# Patient Record
Sex: Male | Born: 1960 | Race: White | Hispanic: No | Marital: Married | Smoking: Never smoker
Health system: Southern US, Community
[De-identification: ages and names within clinical notes are randomized; demographics above are authoritative.]

## PROBLEM LIST (undated history)

## (undated) DIAGNOSIS — T7840XA Allergy, unspecified, initial encounter: Secondary | ICD-10-CM

## (undated) DIAGNOSIS — Z87442 Personal history of urinary calculi: Secondary | ICD-10-CM

## (undated) DIAGNOSIS — K5190 Ulcerative colitis, unspecified, without complications: Secondary | ICD-10-CM

## (undated) DIAGNOSIS — E785 Hyperlipidemia, unspecified: Secondary | ICD-10-CM

## (undated) DIAGNOSIS — N200 Calculus of kidney: Secondary | ICD-10-CM

## (undated) DIAGNOSIS — F419 Anxiety disorder, unspecified: Secondary | ICD-10-CM

## (undated) DIAGNOSIS — N189 Chronic kidney disease, unspecified: Secondary | ICD-10-CM

## (undated) DIAGNOSIS — K219 Gastro-esophageal reflux disease without esophagitis: Secondary | ICD-10-CM

## (undated) HISTORY — DX: Allergy, unspecified, initial encounter: T78.40XA

## (undated) HISTORY — PX: WISDOM TOOTH EXTRACTION: SHX21

## (undated) HISTORY — DX: Gastro-esophageal reflux disease without esophagitis: K21.9

## (undated) HISTORY — PX: APPENDECTOMY: SHX54

## (undated) HISTORY — PX: BACK SURGERY: SHX140

## (undated) HISTORY — DX: Hyperlipidemia, unspecified: E78.5

## (undated) HISTORY — PX: KNEE ARTHROSCOPY: SHX127

## (undated) HISTORY — PX: HERNIA REPAIR: SHX51

## (undated) HISTORY — DX: Ulcerative colitis, unspecified, without complications: K51.90

## (undated) HISTORY — DX: Anxiety disorder, unspecified: F41.9

---

## 2004-07-26 ENCOUNTER — Emergency Department (HOSPITAL_COMMUNITY): Admission: EM | Admit: 2004-07-26 | Discharge: 2004-07-26 | Payer: Self-pay | Admitting: Emergency Medicine

## 2004-07-26 IMAGING — CR DG CHEST 2V
3 series · 3 of 3 positions shown · non-contrast
Comparison: none

HISTORY: Chest pain, left arm pain

CHEST 2 VIEWS:
No prior exam for comparison
Normal heart size and vascularity.
Mediastinal contours normal.
Mild hyperinflation without acute infiltrate or effusion.
No pneumothorax.
Bones unremarkable.

[view not recorded (1 of 3)]
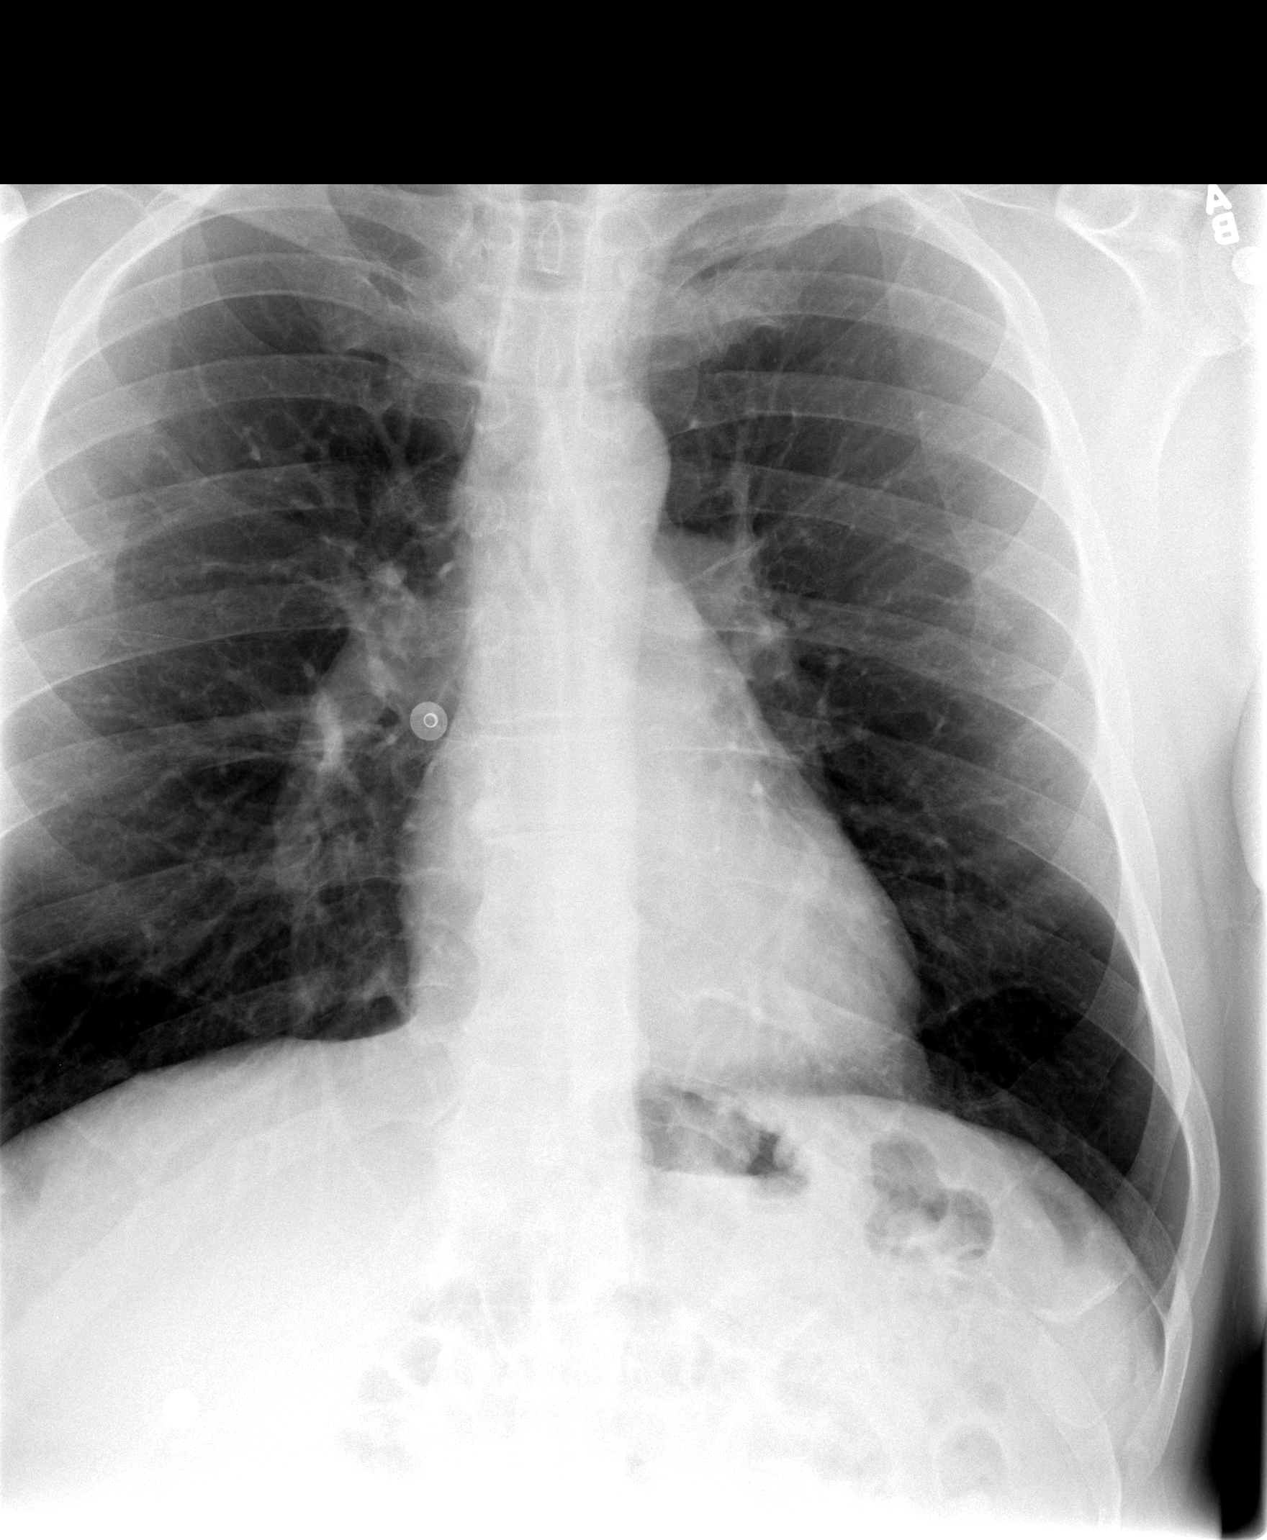

[view not recorded (2 of 3)]
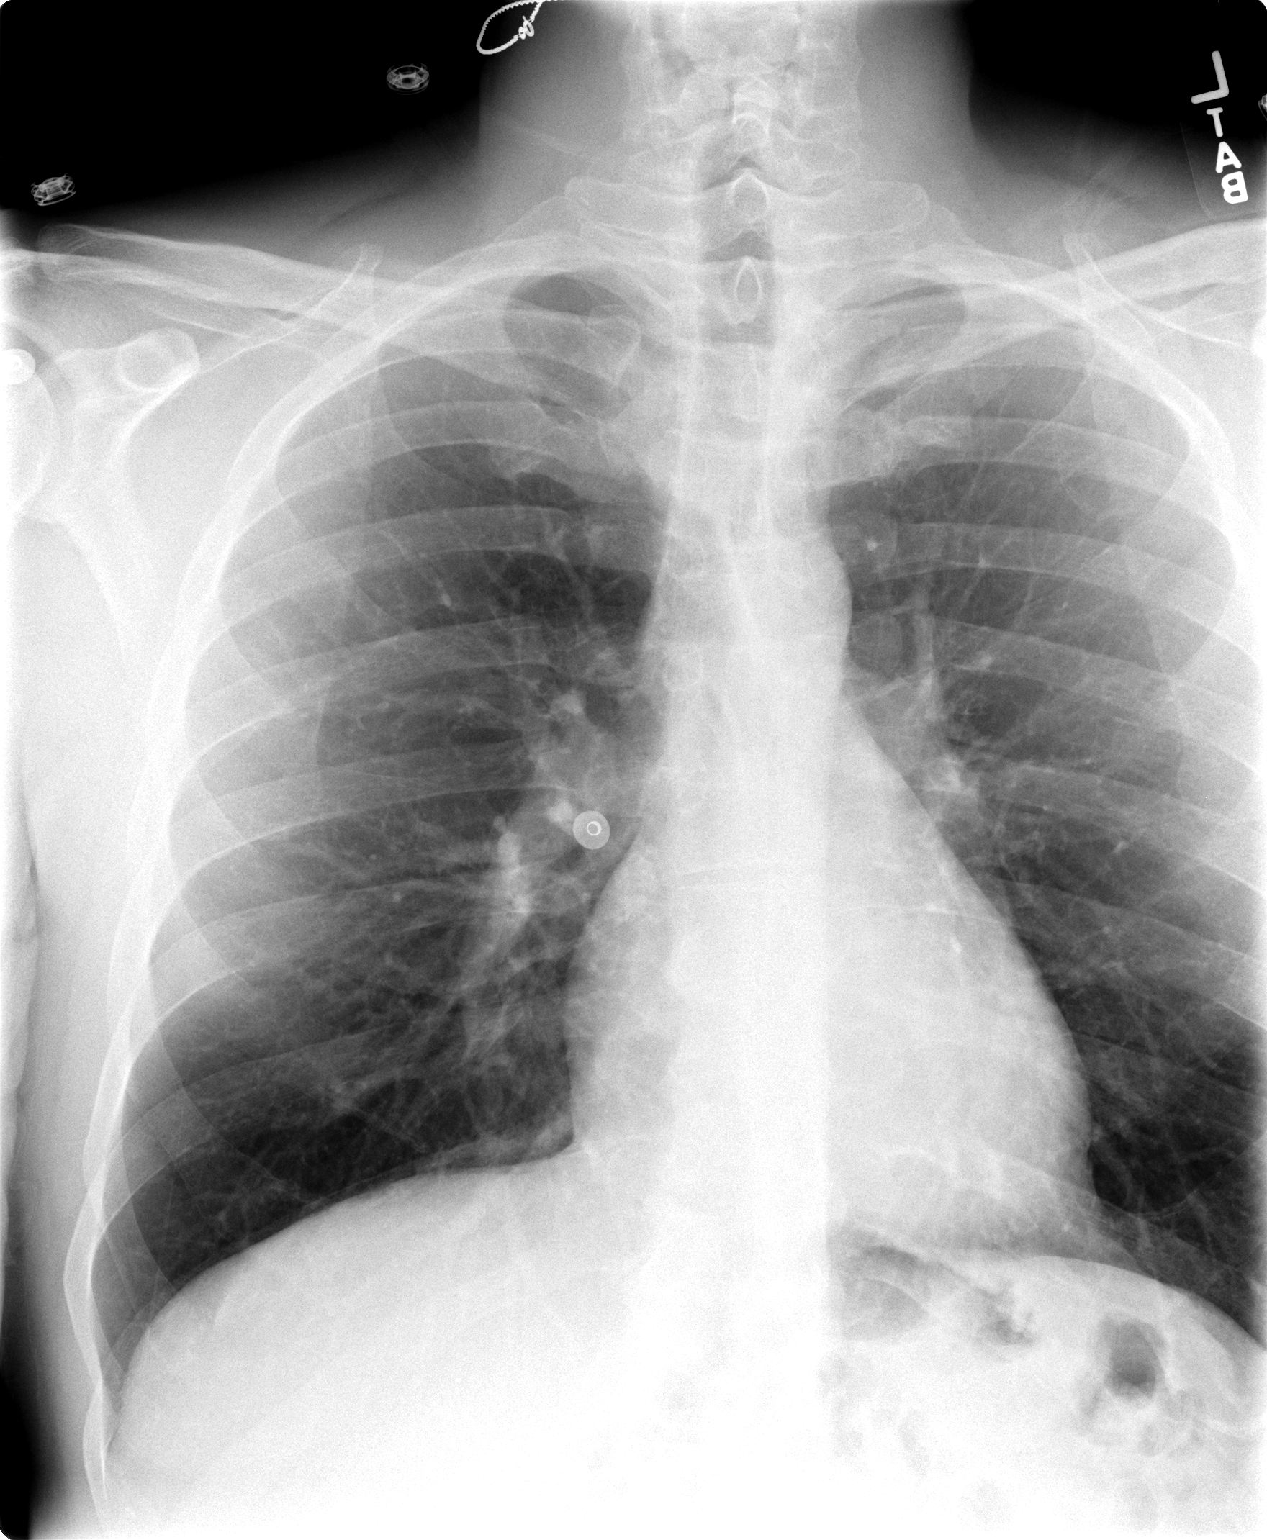

[view not recorded (3 of 3)]
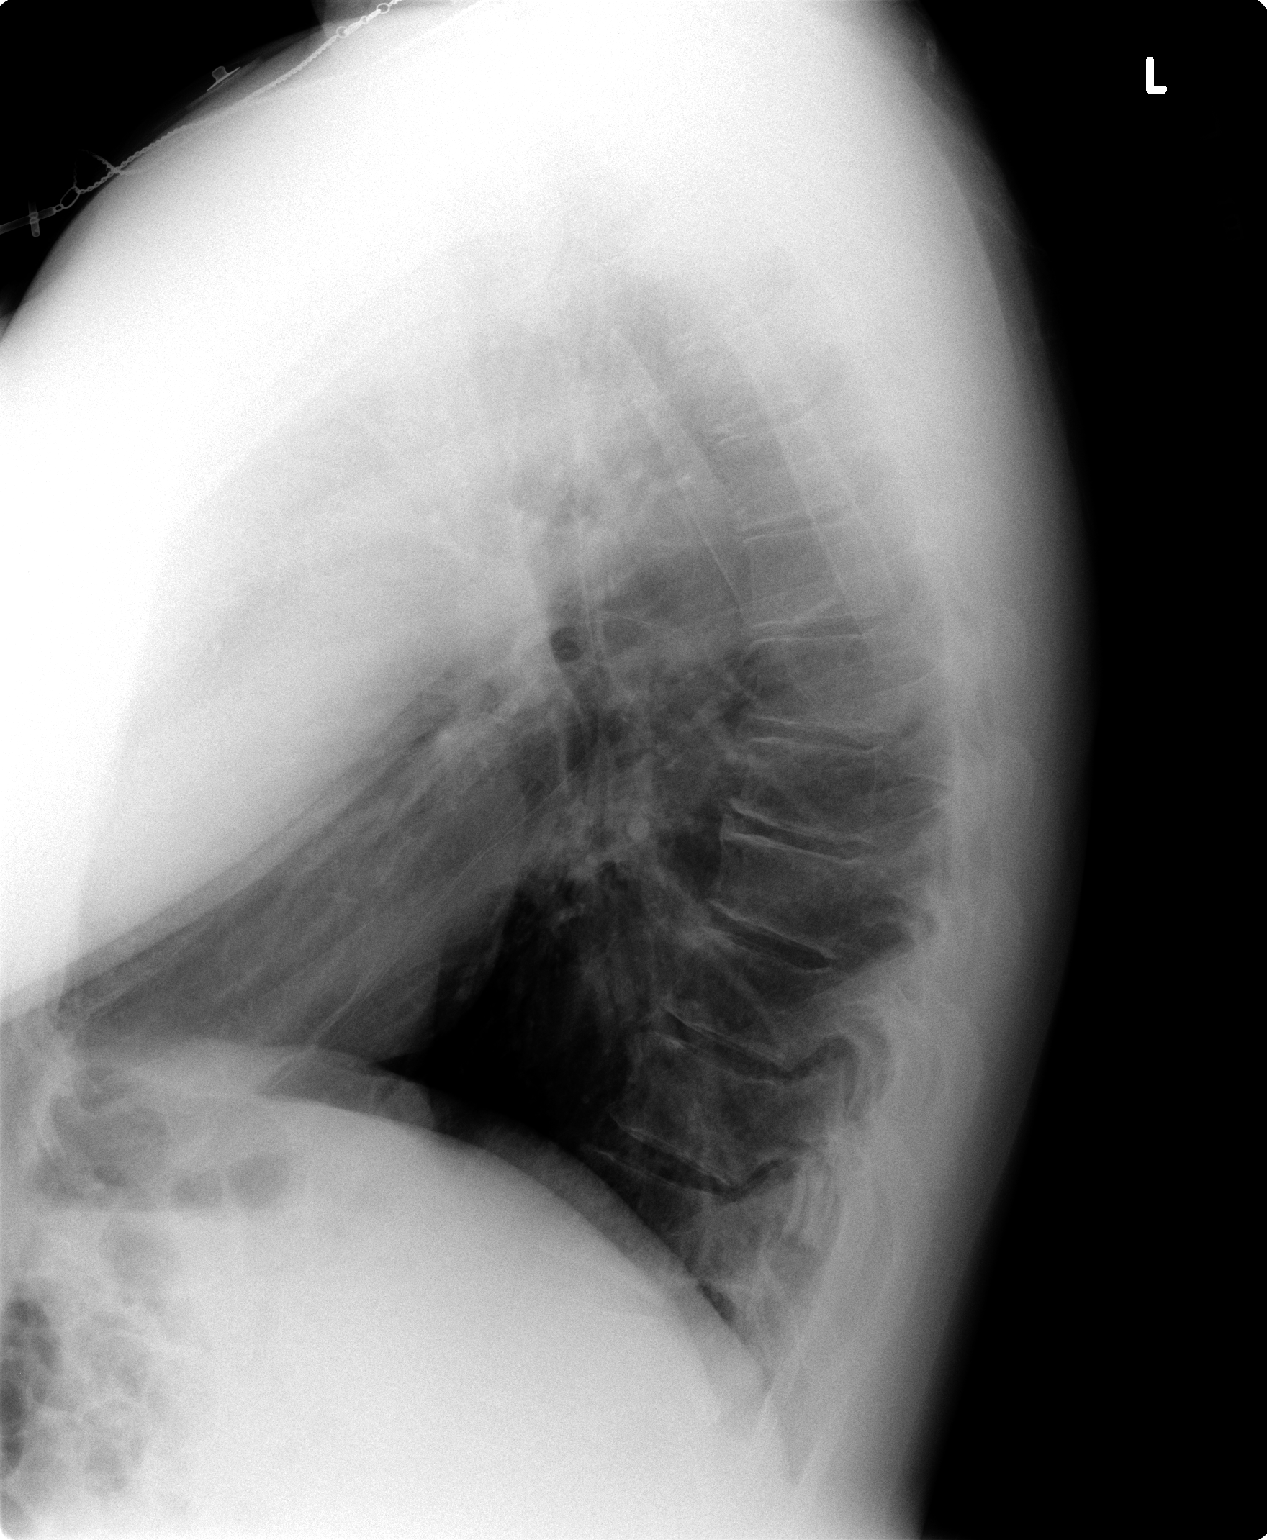

[3 of 3 positions shown; findings below may reference images not displayed]

IMPRESSION: Mild hyperinflation without acute abnormalities.

## 2004-09-24 ENCOUNTER — Encounter: Admission: RE | Admit: 2004-09-24 | Discharge: 2004-09-24 | Payer: Self-pay | Admitting: Neurosurgery

## 2004-10-20 ENCOUNTER — Encounter: Admission: RE | Admit: 2004-10-20 | Discharge: 2004-10-20 | Payer: Self-pay | Admitting: Neurosurgery

## 2004-11-10 ENCOUNTER — Encounter: Admission: RE | Admit: 2004-11-10 | Discharge: 2004-11-10 | Payer: Self-pay | Admitting: Neurosurgery

## 2005-05-14 ENCOUNTER — Ambulatory Visit (HOSPITAL_COMMUNITY): Admission: RE | Admit: 2005-05-14 | Discharge: 2005-05-14 | Payer: Self-pay | Admitting: Cardiovascular Disease

## 2005-05-14 IMAGING — CT CT ANGIO ABDOMEN
2 of 5 series · 16 of 46 positions shown, 18 images · IV contrast (omnipaque)
Comparison: No prior studies for comparison.

CLINICAL DATA: Five-week history of mid chest pain radiating posteriorly to the back.  The patient also complains of some abdominal pain.
 CHEST CT ANGIO WITH CONTRAST:
TECHNIQUE: Multidetector CT imaging of the chest was performed during bolus injection of intravenous contrast.  Multiplanar CT angiographic image reconstructions were generated to evaluate the vascular anatomy.
 Contrast:  80 cc Omnipaque 300 IV.
TECHNIQUE: Multidetector CT imaging of the abdomen was performed during bolus injection of intravenous contrast.  Multiplanar CT angiographic image reconstructions were generated to evaluate the vascular anatomy.

[Series 5: dissection 2.0 st · axial · 0.72mm/px · z∈[-538,-64]mm · 13 of 261 slices shown, 15 images]
[im 12/261  soft-tissue]
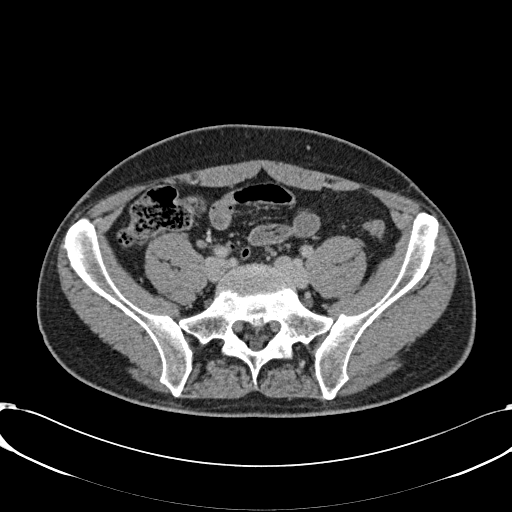
[im 12/261  bone]
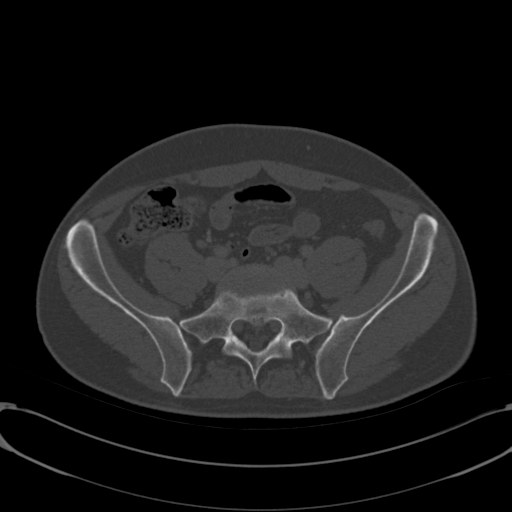
[im 36/261  soft-tissue]
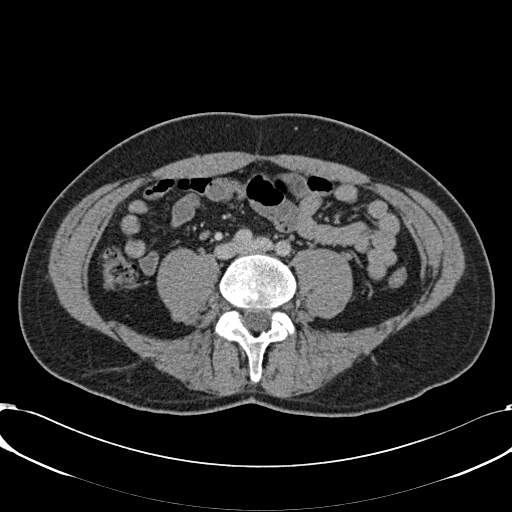
[im 60/261  soft-tissue]
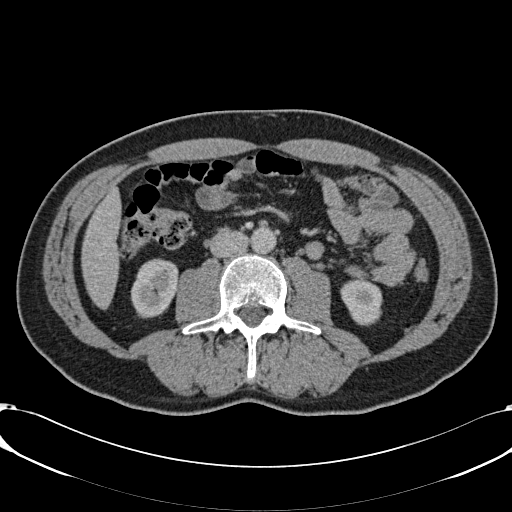
[im 71/261  soft-tissue]
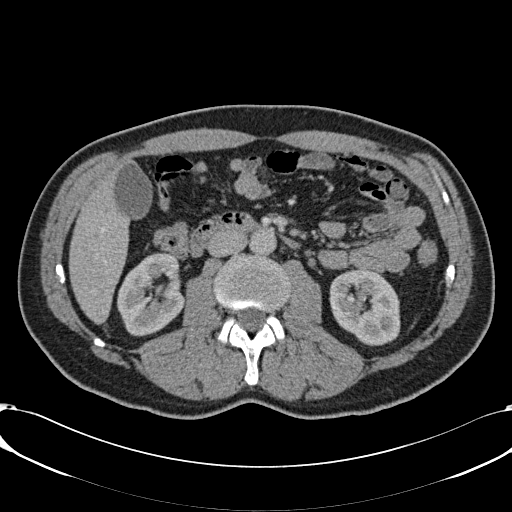
[im 95/261  soft-tissue]
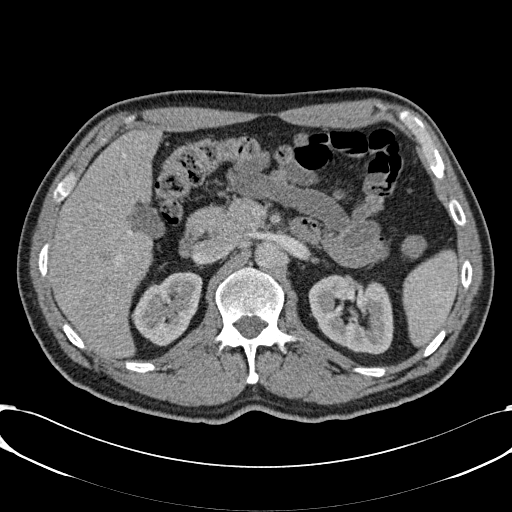
[im 107/261  soft-tissue]
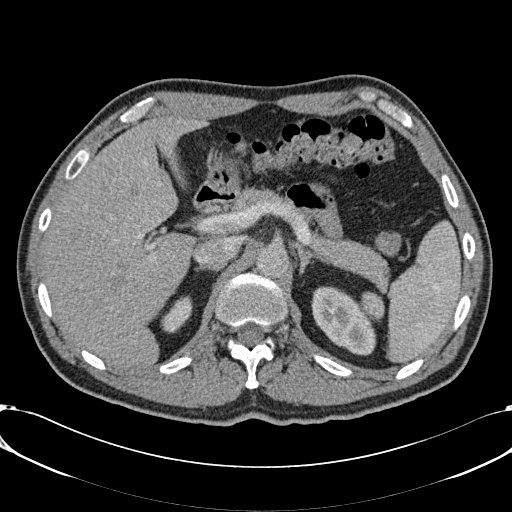
[im 131/261  soft-tissue]
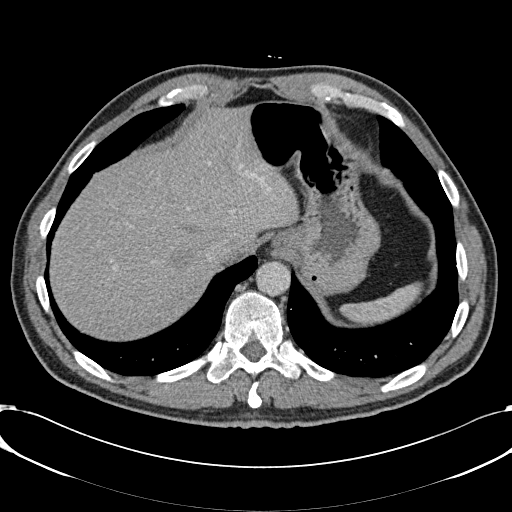
[im 154/261  soft-tissue]
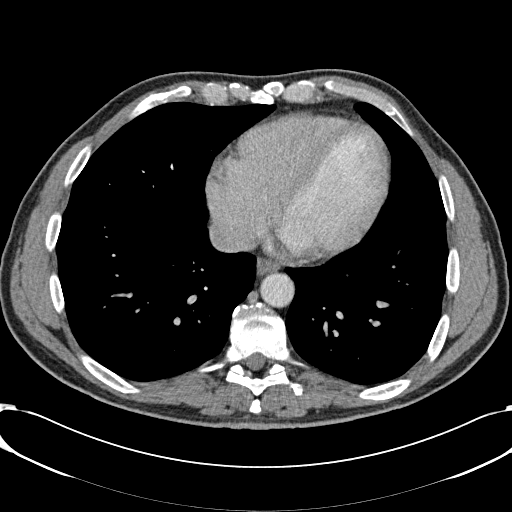
[im 166/261  soft-tissue]
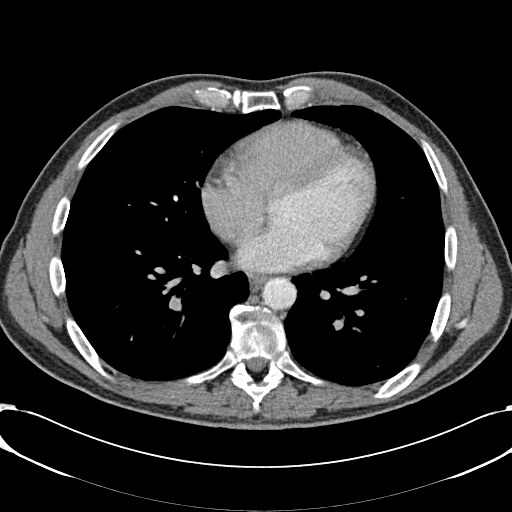
[im 166/261  bone]
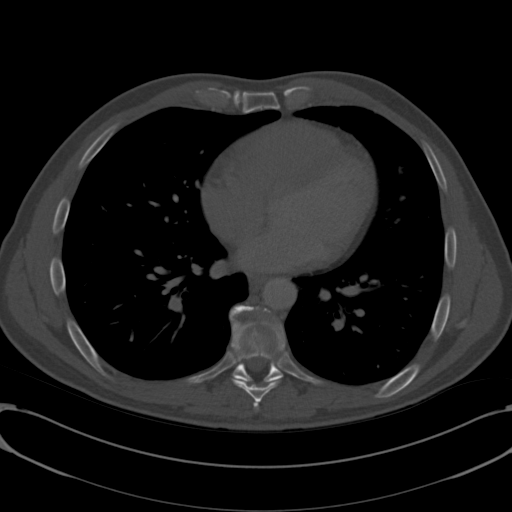
[im 190/261  soft-tissue]
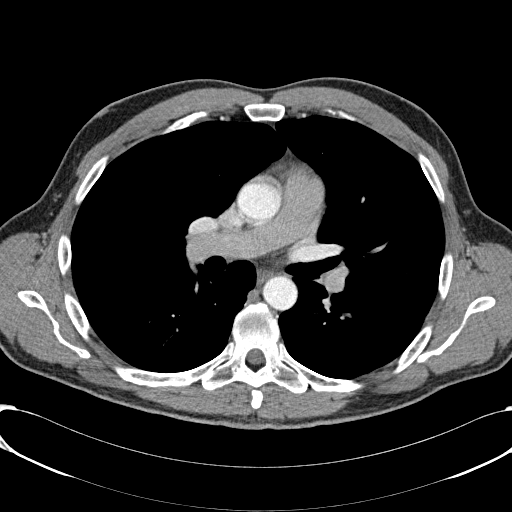
[im 201/261  soft-tissue]
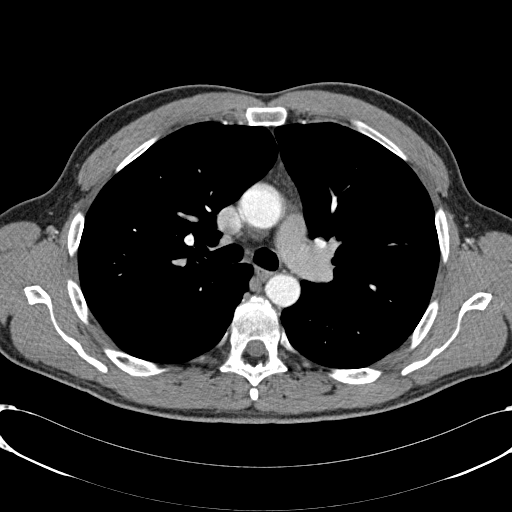
[im 225/261  soft-tissue]
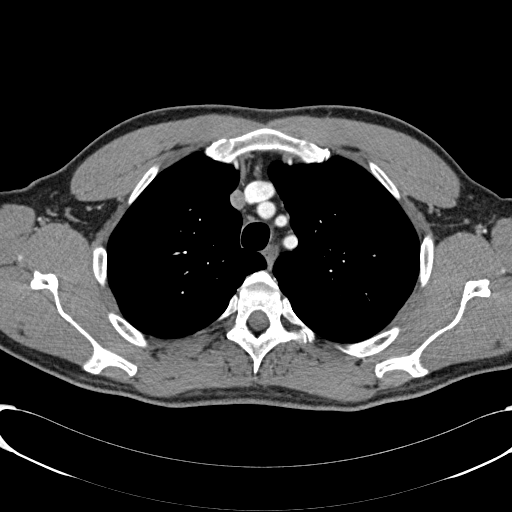
[im 249/261  soft-tissue]
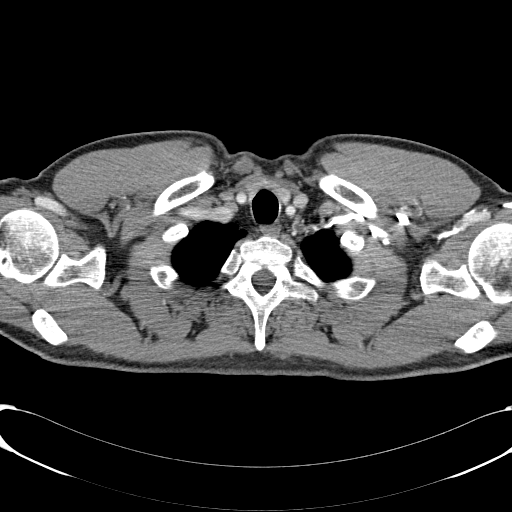

[Series 602: cor thins · coronal · 1.02mm/px · 3 of 51 slices shown]
[im 17/51  soft-tissue]
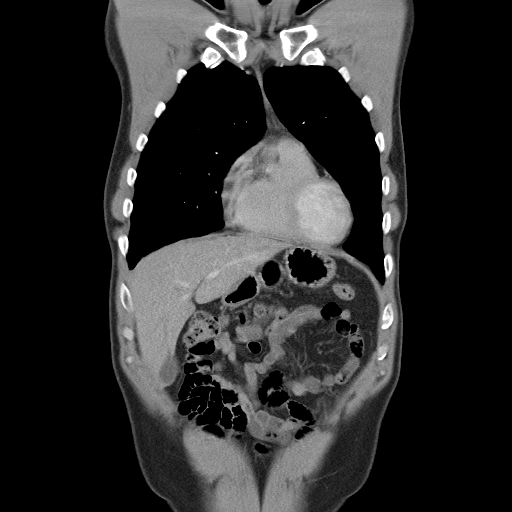
[im 23/51  soft-tissue]
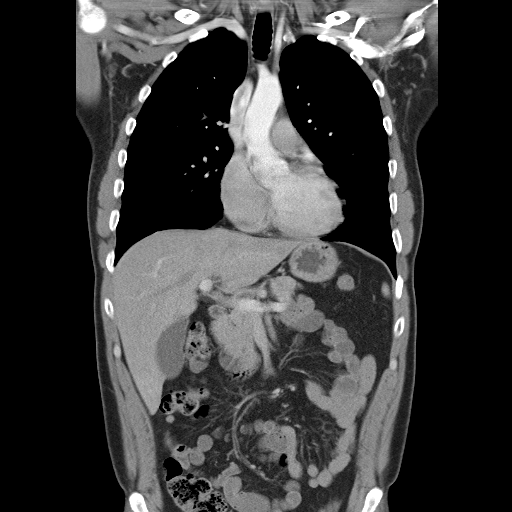
[im 28/51  soft-tissue]
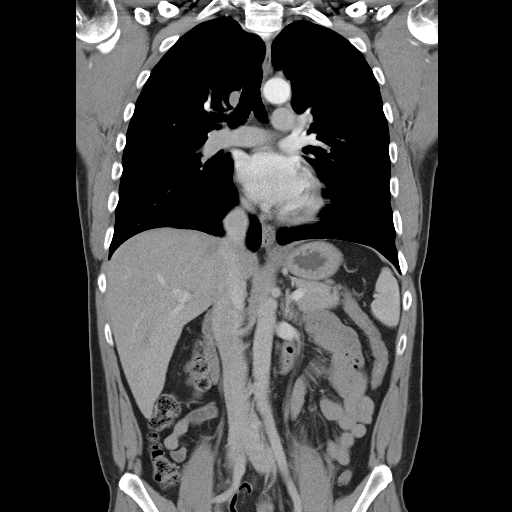

[16 of 46 positions shown; findings below may reference images not displayed]

FINDINGS: Contrast timing was tailored to aortic evaluation.   Thoracic aorta is of normal caliber.   No evidence of aneurysmal disease or dissection.   Proximal great vessels are also well-visualized and show normal patency.   
 The rest of the structures in the chest are normal including the mediastinum, heart, and lungs.   No pleural effusions.   Bony thorax is unremarkable.
IMPRESSION: Normal CT angiography of the chest demonstrating normal caliber thoracic aorta without dissection.   No other findings.
 CT ANGIOGRAPHY OF ABDOMEN:
FINDINGS: Abdominal aortic opacification for CTA was suboptimal with the majority of arterial contrast present in the chest at the time of scanning.  However, the study is felt adequate to exclude aortic aneurysmal disease and dissection.  Major proximal branches off of the aorta show normal patency including the celiac axis, superior mesenteric artery, bilateral single renal arteries, and the inferior mesenteric artery.  Several small renal calculi are present in the left kidney including a cluster of two adjacent calculi in the lower pole.  These are not causing obstruction.  Other abdominal structures are unremarkable.
IMPRESSION: Normal abdominal aorta demonstrating no evidence of aneurysmal disease or dissection.  Other branch vessels also appear normally patent.  Left renal calculi without obstruction.

## 2006-01-19 ENCOUNTER — Ambulatory Visit: Payer: Self-pay | Admitting: Gastroenterology

## 2006-02-02 ENCOUNTER — Ambulatory Visit: Payer: Self-pay | Admitting: Gastroenterology

## 2006-03-16 ENCOUNTER — Ambulatory Visit: Payer: Self-pay | Admitting: Gastroenterology

## 2009-03-03 ENCOUNTER — Ambulatory Visit: Payer: Self-pay | Admitting: Sports Medicine

## 2009-03-03 DIAGNOSIS — M25539 Pain in unspecified wrist: Secondary | ICD-10-CM

## 2009-03-03 DIAGNOSIS — M25529 Pain in unspecified elbow: Secondary | ICD-10-CM | POA: Insufficient documentation

## 2009-03-03 DIAGNOSIS — M7710 Lateral epicondylitis, unspecified elbow: Secondary | ICD-10-CM | POA: Insufficient documentation

## 2010-09-18 NOTE — Assessment & Plan Note (Signed)
[name]                           GASTROENTEROLOGY OFFICE NOTE   Steven Torres, Steven Torres                        MRN:          [id number]  DATE:03/16/2006                            DOB:          1960/06/15    PROBLEM:  Abdominal pain.   Steven Torres has returned for scheduled GI followup.  Colonoscopy demonstrated  mild sigmoid diverticulosis.  He was started on daily fiber supplementation.  Since that time, his bowels have regularized, and he no longer has abdominal  pain.  Altogether, he is feeling quite well.   PHYSICAL EXAMINATION:  VITAL SIGNS:  Pulse 56, blood pressure 120/74.  Weight 189.   IMPRESSION:  1. Nonspecific lower gastrointestinal tract complaints, resolved with      fiber supplementation.  2. Mild diverticulosis.   RECOMMENDATIONS:  Continue current regimen.     Sandy Salaam. Deatra Ina, MD,FACG  Electronically Signed    RDK/MedQ  DD: 03/16/2006  DT: 03/16/2006  Job #: [id number]   cc:   Tammy R. Modena Morrow, M.D.

## 2010-09-18 NOTE — Letter (Signed)
January 19, 2006     Tammy R. Modena Morrow, M.D.  [address], [place] [address]   RE:  JEWELZ, KOBUS  MRN:  [id number]  /  DOB:  1960/10/11   Dear Dr. Modena Morrow:   Upon your kind referral, I had the pleasure of evaluating your patient and  I am pleased to offer my findings.  I saw Steven Torres in the office today.  Enclosed is a copy of my progress note that details my findings and  recommendations.   Thank you for the opportunity to participate in your patient's care.    Sincerely,      Sandy Salaam. Deatra Ina, MD,FACG   RDK/MedQ  DD:  01/19/2006  DT:  01/20/2006  Job #:  [id number]

## 2010-09-18 NOTE — Letter (Signed)
January 19, 2006     Norva Pavlov  [address]  [place], [place]  [place]   RE:  [name], [name]  MRN:  [id number]  /  DOB:  April 08, 1961   Dear Mr. Steven Torres:   It is my pleasure to have treated you recently as a new patient in my  office.  I appreciate your confidence and the opportunity to participate in  your care.   Since I do have a busy inpatient endoscopy schedule and office schedule, my  office hours vary weekly.  I am, however, available for emergency calls  every day through my office.  If I cannot promptly meet an urgent office  appointment, another one of our gastroenterologists will be able to assist  you.   My well-trained staff are prepared to help you at all times.  For  emergencies after office hours, a physician from our gastroenterology  section is always available through my 24-hour answering service.   While you are under my care, I encourage discussion of your questions and  concerns, and I will be happy to return your calls as soon as I am  available.   Once again, I welcome you as a new patient and I look forward to a happy and  healthy relationship.   Sincerely,     Sandy Salaam. Deatra Ina, MD,FACG   RDK/MedQ  DD:  01/19/2006 DT:  01/20/2006 Job #:  [id number]

## 2010-09-18 NOTE — Assessment & Plan Note (Signed)
[facility] OFFICE NOTE   Steven Torres, Steven Torres                        MRN:          [id number]  DATE:01/19/2006                            DOB:          [birth year]    REASON FOR CONSULTATION:  Abdominal discomfort and change of bowel habits.   Steven Torres is a 50 year old white male who is referred through the courtesy  of Dr. Modena Morrow for evaluation.  For several months, he has been complaining of  abdominal discomfort consisting of pain in the left periumbilical area.  The  patient is worse with sitting and is relieved with lying down or standing  up.  It is unrelated to eating or bowel movements.  He has also noted a  change in bowel habits.  He has had somewhat erratic bowels in the past.  He  is now complaining of postprandial abdominal discomfort with the urge to  defecate.  He passes gas only.  There is no history of melena or  hematochezia.  Bowels are somewhat erratic.  He has occasional reflux.  He  has taken Nexium in the past with relief.   PAST MEDICAL HISTORY:  Unremarkable.  He is status post herniorrhaphy.   FAMILY HISTORY:  Noncontributory.   CURRENT MEDICATIONS:  Include Zegerid daily.   ALLERGIES:  None.   SOCIAL HISTORY:  He neither smokes nor drinks.  He is married and works as a  Scientist, research (physical sciences).  Review of systems was reviewed and  is positive for joint pains and back pains.   PHYSICAL EXAMINATION:  VITAL SIGNS:  Pulse 60, blood pressure 110/70, weight  182 per template.  ABDOMEN:  There are no abdominal masses, tenderness or organomegaly.  There  are no frank hernias.  Remainder of the exam is normal per template.  RECTAL:  Deferred.   IMPRESSION:  1. Probable abdominal wall pain.  2. Change of bowel habits.  This is most likely a variant of irritable      bowel syndrome.  Obstructive lesion of the colon is less likely though      ought to be ruled out.   RECOMMENDATIONS:  1. Local heat and NSAIDs for abdominal wall pain.  2. Colonoscopy.  If negative the patient will consider enrollment in an      irritable bowel syndrome trial.                                   Sandy Salaam. Deatra Ina, MD,FACG   RDK/MedQ  DD:  01/19/2006  DT:  01/20/2006  Job #:  [id number]   cc:   Tammy R. Modena Morrow, M.D.

## 2011-02-23 ENCOUNTER — Encounter: Payer: Self-pay | Admitting: Gastroenterology

## 2011-02-23 ENCOUNTER — Ambulatory Visit (INDEPENDENT_AMBULATORY_CARE_PROVIDER_SITE_OTHER): Payer: BC Managed Care – PPO | Admitting: Gastroenterology

## 2011-02-23 VITALS — BP 122/84 | HR 80 | Ht 73.0 in | Wt 194.8 lb

## 2011-02-23 DIAGNOSIS — R1013 Epigastric pain: Secondary | ICD-10-CM

## 2011-02-23 NOTE — Assessment & Plan Note (Addendum)
Pain may be related to ulcer or nonulcer dyspepsia. He likely has at least an element of acid reflux.  Recommendations #1 continue Nexium #2 upper endoscopy #3 to consider abdominal ultrasound if pain does not subside

## 2011-02-23 NOTE — Patient Instructions (Signed)
Upper GI Endoscopy Upper GI endoscopy means using a flexible scope to look at the esophagus, stomach, and upper small bowel. This is done to make a diagnosis in people with heartburn, abdominal pain, or abnormal bleeding. Sometimes an endoscope is needed to remove foreign bodies or food that become stuck in the esophagus; it can also be used to take biopsy samples. For the best results, do not eat or drink for 8 hours before having your upper endoscopy.  To perform the endoscopy, you will probably be sedated and your throat will be numbed with a special spray. The endoscope is then slowly passed down your throat (this will not interfere with your breathing). An endoscopy exam takes 15 to 30 minutes to complete and there is no real pain. Patients rarely remember much about the procedure. The results of the test may take several days if a biopsy or other test is taken.  You may have a sore throat after an endoscopy exam. Serious complications are very rare. Stick to liquids and soft foods until your pain is better. Do not drive a car or operate any dangerous equipment for at least 24 hours after being sedated. SEEK IMMEDIATE MEDICAL CARE IF:   You have severe throat pain.   You have shortness of breath.   You have bleeding problems.   You have a fever.   You have difficulty recovering from your sedation.  Document Released: 05/27/2004 Document Revised: 12/30/2010 Document Reviewed: 04/21/2008 [facility] Patient Information 2012 [place]. Your Endoscopy is scheduled on 02/26/2011 at 9am

## 2011-02-23 NOTE — Progress Notes (Signed)
Steven Torres is a 50 year old white male referred at the request of Dr. Modena Morrow for evaluation of chest pain. He has had several episodes of severe upper abdominal and lower chest pain. It is associated with pyrosis. He took a course of Nexium several weeks ago with resolution. Pain recurred last week and has since decreased since resuming Nexium. He denies dysphagia, nausea or vomiting. He does relate pain to stress. He is on no gastric irritants including nonsteroidals.  He underwent colonoscopy in 2007 that apparently showed diverticula only.      History reviewed. No pertinent past medical history. Past Surgical History  Procedure Date  . Back surgery   . Hernia repair   . Knee arthroscopy     reports that he has never smoked. He has never used smokeless tobacco. He reports that he does not drink alcohol or use illicit drugs. family history includes Colon polyps in his mother and Prostate cancer in his father.  Current medications and social history were reviewed in [facility] record  Review of Systems: Pertinent positive and negative review of systems were noted in the above HPI section. All other review of systems were otherwise negative.  Vital signs were reviewed in today's medical record Physical Exam: General: Well developed , well nourished, no acute distress Head: Normocephalic and atraumatic Eyes:  sclerae anicteric, EOMI Ears: Normal auditory acuity Mouth: No deformity or lesions Neck: Supple, no masses or thyromegaly Lungs: Clear throughout to auscultation Heart: Regular rate and rhythm; no murmurs, rubs or bruits Abdomen: Soft, non tender and non distended. No masses, hepatosplenomegaly or hernias noted. Normal Bowel sounds Rectal:deferred Musculoskeletal: Symmetrical with no gross deformities  Skin: No lesions on visible extremities Pulses:  Normal pulses noted Extremities: No clubbing, cyanosis, edema or deformities noted Neurological: Alert  oriented x 4, grossly nonfocal Cervical Nodes:  No significant cervical adenopathy Inguinal Nodes: No significant inguinal adenopathy Psychological:  Alert and cooperative. Normal mood and affect

## 2011-02-26 ENCOUNTER — Encounter: Payer: Self-pay | Admitting: *Deleted

## 2011-02-26 ENCOUNTER — Ambulatory Visit (AMBULATORY_SURGERY_CENTER): Payer: BC Managed Care – PPO | Admitting: Gastroenterology

## 2011-02-26 ENCOUNTER — Encounter: Payer: Self-pay | Admitting: Gastroenterology

## 2011-02-26 DIAGNOSIS — K219 Gastro-esophageal reflux disease without esophagitis: Secondary | ICD-10-CM

## 2011-02-26 DIAGNOSIS — R1013 Epigastric pain: Secondary | ICD-10-CM

## 2011-02-26 MED ORDER — ESOMEPRAZOLE MAGNESIUM 40 MG PO CPDR: 40.0000 mg | DELAYED_RELEASE_CAPSULE | Freq: Every day | ORAL | Status: DC

## 2011-02-26 MED ORDER — SODIUM CHLORIDE 0.9 % IV SOLN: 500.0000 mL | INTRAVENOUS | Status: DC

## 2011-02-26 NOTE — Patient Instructions (Signed)
Discharge instructions given with verbal understanding.  Normal exam.  Resume previous medications.

## 2011-02-26 NOTE — Progress Notes (Signed)
Pt tolerated the EGD exam very well. maw

## 2011-03-01 ENCOUNTER — Telehealth: Payer: Self-pay

## 2011-03-01 NOTE — Telephone Encounter (Signed)
I left a message on the pt's home answering machine to call if any questions or concerns. maw

## 2011-04-08 ENCOUNTER — Telehealth: Payer: Self-pay | Admitting: Gastroenterology

## 2011-04-08 ENCOUNTER — Ambulatory Visit: Payer: BC Managed Care – PPO | Admitting: Gastroenterology

## 2011-04-08 MED ORDER — ESOMEPRAZOLE MAGNESIUM 40 MG PO PACK: 40.0000 mg | PACK | Freq: Every day | ORAL | Status: DC

## 2011-04-08 NOTE — Telephone Encounter (Signed)
OV was a routine follow-up from his EGD. Pt states he is not having any problems at all and he will call us to schedule an appt if he needs to. Rx for Nexium was supposed to be sent to the pharmacy for the pt in Oct. Pharmacy states they did not get it. Rx resent to pharmacy.

## 2011-10-01 ENCOUNTER — Ambulatory Visit (INDEPENDENT_AMBULATORY_CARE_PROVIDER_SITE_OTHER): Payer: BC Managed Care – PPO | Admitting: Gastroenterology

## 2011-10-01 ENCOUNTER — Encounter: Payer: Self-pay | Admitting: Gastroenterology

## 2011-10-01 VITALS — BP 112/70 | HR 80 | Ht 73.5 in | Wt 195.0 lb

## 2011-10-01 DIAGNOSIS — K625 Hemorrhage of anus and rectum: Secondary | ICD-10-CM

## 2011-10-01 MED ORDER — HYDROCORTISONE ACETATE 25 MG RE SUPP: 25.0000 mg | Freq: Two times a day (BID) | RECTAL | Status: AC

## 2011-10-01 NOTE — Progress Notes (Signed)
History of Present Illness: Mr.  Steven Torres is a 51 year old white male referred for evaluation of rectal bleeding. Over the past 6 months he's noted very intermittent bright red blood on the toilet tissue. Symptoms are becoming more frequent and slightly more severe. He denies change in bowel habits, abdominal or rectal pain. Colonoscopy about 6 years ago apparantly demonstrated diverticulosis only.    Past Medical History  Diagnosis Date  . GERD (gastroesophageal reflux disease)    Past Surgical History  Procedure Date  . Back surgery   . Hernia repair   . Knee arthroscopy     right   family history includes Colon polyps in his mother and Prostate cancer in his father. Current Outpatient Prescriptions  Medication Sig Dispense Refill  . ALPRAZolam (XANAX) 0.5 MG tablet       . aspirin 81 MG tablet Take 81 mg by mouth daily.        Marland Kitchen b complex vitamins tablet Take 1 tablet by mouth daily.        . cholecalciferol (VITAMIN D) 1000 UNITS tablet Take 2,000 Units by mouth 2 (two) times daily.        . fish oil-omega-3 fatty acids 1000 MG capsule Take 3,000 g by mouth daily.        . Multiple Vitamin (MULTIVITAMIN) capsule Take 1 capsule by mouth 2 (two) times daily.         Allergies as of 10/01/2011  . (No Known Allergies)    reports that he has never smoked. He has never used smokeless tobacco. He reports that he does not drink alcohol or use illicit drugs.     Review of Systems: Pertinent positive and negative review of systems were noted in the above HPI section. All other review of systems were otherwise negative.  Vital signs were reviewed in today's medical record Physical Exam: General: Well developed , well nourished, no acute distress Head: Normocephalic and atraumatic Eyes:  sclerae anicteric, EOMI Ears: Normal auditory acuity Mouth: No deformity or lesions Neck: Supple, no masses or thyromegaly Lungs: Clear throughout to auscultation Heart: Regular rate and rhythm; no  murmurs, rubs or bruits Abdomen: Soft, non tender and non distended. No masses, hepatosplenomegaly or hernias noted. Normal Bowel sounds Rectal: There are no external abnormalities Musculoskeletal: Symmetrical with no gross deformities  Skin: No lesions on visible extremities Pulses:  Normal pulses noted Extremities: No clubbing, cyanosis, edema or deformities noted Neurological: Alert oriented x 4, grossly nonfocal Cervical Nodes:  No significant cervical adenopathy Inguinal Nodes: No significant inguinal adenopathy Psychological:  Alert and cooperative. Normal mood and affect

## 2011-10-01 NOTE — Patient Instructions (Signed)
You have been scheduled on 10/25/2011 for your procedure Separate instructions have been given  Flexible Sigmoidoscopy Your caregiver has ordered a flexible sigmoidoscopy. This is an exam to evaluate your lower colon. In this exam your colon is cleansed and a short fiber optic tube is inserted through your rectum and into your colon. The fiber optic scope (endoscope) is a short bundle of enclosed flexible small glass fibers. It transmits light to the area examined and images from that area to your caregiver. You do not have to worry about glass breakage in the endoscope. Discomfort is usually minimal. Sedatives and pain medications are generally not required. This exam helps to detect tumors (lumps), polyps, inflammation (swelling and soreness), and areas of bleeding. It may also be used to take biopsies. These are small pieces of tissue taken to examine under a microscope. LET YOUR CAREGIVER KNOW ABOUT:  Allergies.   Medications taken including herbs, eye drops, over the counter medications, and creams.   Use of steroids (by mouth or creams).   Previous problems with anesthetics or novocaine   Possibility of pregnancy, if this applies.   History of blood clots (thrombophlebitis).   History of bleeding or blood problems.   Previous surgery.   Other health problems.  BEFORE THE PROCEDURE Eat normally the night before the exam. Your caregiver may order a mild enema or laxative the night before. No eating or drinking should occur after midnight until the procedure is completed. A rectal suppository or enemas may be given in the morning prior to your procedure. You will be brought to the examination area in a hospital gown. You should be present 60 minutes prior to your procedure or as directed.  AFTER THE PROCEDURE   There is sometimes a little blood passed with the first bowel movement. Do not be concerned. Because air is often used during the exam, it is not unusual to pass gas and  experience abdominal (belly) cramping. Walking or a warm pack on your abdomen may help with this. Do not sleep with a heating pad as burns can occur.   You may resume all normal eating and activities.   Only take over-the-counter or prescription medicines for pain, discomfort, or fever as directed by your caregiver. Do not use aspirin or blood thinners if a biopsy (tissue sample) was taken. Consult your caregiver for medication usage if biopsies were taken.   Call for your results as instructed by your caregiver. Remember, it is your responsibility to obtain the results of your biopsy. Do not assume everything is fine because you do not hear from your caregiver.  SEEK IMMEDIATE MEDICAL CARE IF:  An oral temperature above 102 F (38.9 C) develops.   You pass large blood clots or fill a toilet with blood following the procedure. This may also occur 10 to 14 days following the procedure. It is more likely if a biopsy was taken.   You develop abdominal pain not relieved with medication or that is getting worse rather than better.  Document Released: 04/16/2000 Document Revised: 04/08/2011 Document Reviewed: 01/27/2005 [facility] Patient Information 2012 [place].

## 2011-10-01 NOTE — Assessment & Plan Note (Signed)
Very limited intermittent rectal bleeding is most likely secondary to hemorrhoidal disease.  Recommendations #1 Anusol HC suppositories #2 sigmoidoscopy

## 2011-10-25 ENCOUNTER — Other Ambulatory Visit: Payer: BC Managed Care – PPO | Admitting: Gastroenterology

## 2011-11-08 ENCOUNTER — Ambulatory Visit: Payer: BC Managed Care – PPO | Admitting: Gastroenterology

## 2011-11-12 ENCOUNTER — Ambulatory Visit (AMBULATORY_SURGERY_CENTER): Payer: BC Managed Care – PPO | Admitting: Gastroenterology

## 2011-11-12 ENCOUNTER — Encounter: Payer: Self-pay | Admitting: Gastroenterology

## 2011-11-12 VITALS — BP 126/70 | HR 62 | Temp 96.3°F | Resp 18 | Ht 73.5 in | Wt 195.0 lb

## 2011-11-12 DIAGNOSIS — K625 Hemorrhage of anus and rectum: Secondary | ICD-10-CM

## 2011-11-12 DIAGNOSIS — K5289 Other specified noninfective gastroenteritis and colitis: Secondary | ICD-10-CM

## 2011-11-12 MED ORDER — SODIUM CHLORIDE 0.9 % IV SOLN: 500.0000 mL | INTRAVENOUS | Status: DC

## 2011-11-12 NOTE — Progress Notes (Signed)
Patient did not experience any of the following events: a burn prior to discharge; a fall within the facility; wrong site/side/patient/procedure/implant event; or a hospital transfer or hospital admission upon discharge from the facility. (G8907) Patient did not have preoperative order for IV antibiotic SSI prophylaxis. (G8918)  

## 2011-11-12 NOTE — Patient Instructions (Addendum)
YOU HAD AN ENDOSCOPIC PROCEDURE TODAY AT [facility]: Refer to the procedure report that was given to you for any specific questions about what was found during the examination.  If the procedure report does not answer your questions, please call your gastroenterologist to clarify.  If you requested that your care partner not be given the details of your procedure findings, then the procedure report has been included in a sealed envelope for you to review at your convenience later.  YOU SHOULD EXPECT: Some feelings of bloating in the abdomen. Passage of more gas than usual.  Walking can help get rid of the air that was put into your GI tract during the procedure and reduce the bloating. If you had a lower endoscopy (such as a colonoscopy or flexible sigmoidoscopy) you may notice spotting of blood in your stool or on the toilet paper. If you underwent a bowel prep for your procedure, then you may not have a normal bowel movement for a few days.  DIET: Your first meal following the procedure should be a light meal and then it is ok to progress to your normal diet.  A half-sandwich or bowl of soup is an example of a good first meal.  Heavy or fried foods are harder to digest and may make you feel nauseous or bloated.  Likewise meals heavy in dairy and vegetables can cause extra gas to form and this can also increase the bloating.  Drink plenty of fluids but you should avoid alcoholic beverages for 24 hours.  ACTIVITY: Your care partner should take you home directly after the procedure.  You should plan to take it easy, moving slowly for the rest of the day.  You can resume normal activity the day after the procedure however you should NOT DRIVE or use heavy machinery for 24 hours (because of the sedation medicines used during the test).    SYMPTOMS TO REPORT IMMEDIATELY: A gastroenterologist can be reached at any hour.  During normal business hours, 8:30 AM to 5:00 PM Monday through Friday,  call [phone].  After hours and on weekends, please call the GI answering service at [phone] who will take a message and have the physician on call contact you.   Following lower endoscopy (colonoscopy or flexible sigmoidoscopy):  Excessive amounts of blood in the stool  Significant tenderness or worsening of abdominal pains  Swelling of the abdomen that is new, acute  Fever of 100F or higher  Following upper endoscopy (EGD)  Vomiting of blood or coffee ground material  New chest pain or pain under the shoulder blades  Painful or persistently difficult swallowing  New shortness of breath  Fever of 100F or higher  Black, tarry-looking stools  FOLLOW UP: If any biopsies were taken you will be contacted by phone or by letter within the next 1-3 weeks.  Call your gastroenterologist if you have not heard about the biopsies in 3 weeks.  Our staff will call the home number listed on your records the next business day following your procedure to check on you and address any questions or concerns that you may have at that time regarding the information given to you following your procedure. This is a courtesy call and so if there is no answer at the home number and we have not heard from you through the emergency physician on call, we will assume that you have returned to your regular daily activities without incident.  SIGNATURES/CONFIDENTIALITY: You and/or your care  partner have signed paperwork which will be entered into your electronic medical record.  These signatures attest to the fact that that the information above on your After Visit Summary has been reviewed and is understood.  Full responsibility of the confidentiality of this discharge information lies with you and/or your care-partner.   Diverticulosis and high fiber diet given.  Await biopsy results

## 2011-11-12 NOTE — Op Note (Signed)
[facility]. [place], [place]  [id number]  FLEXIBLE SIGMOIDOSCOPY PROCEDURE REPORT  PATIENT:  Steven Torres, Steven Torres  MR#:  [id number] BIRTHDATE:  Dec 03, 1960, 50 yrs. old  GENDER:  male  ENDOSCOPIST:  Sandy Salaam. Deatra Ina, MD Referred by:  PROCEDURE DATE:  11/12/2011 PROCEDURE:  Flexible Sigmoidoscopy with biopsy ASA CLASS:  Class I INDICATIONS:  rectal bleeding  MEDICATIONS:   MAC sedation, administered by CRNA propofol 152mIV  DESCRIPTION OF PROCEDURE:   After the risks benefits and alternatives of the procedure were thoroughly explained, informed consent was obtained.  Digital rectal exam was performed and revealed no abnormalities.   The LB-PCF-H180AL [id number] was introduced through the anus and advanced to the splenic flexure, without limitations.  The quality of the prep was .  The instrument was then slowly withdrawn as the mucosa was fully examined. <<PROCEDUREIMAGES>>  The mucosa was eryhematous. Mild erythema in the rectum with more severe changes in sigmoid extending to 30cm. Bxs taken to r/o colitis (see image2, image3, image4, image5, and image6).  Mild diverticulosis was found.   Retroflexed views in the rectum revealed proctitis.    The scope was then withdrawn from the patient and the procedure terminated.  COMPLICATIONS:  None  ENDOSCOPIC IMPRESSION: 1) Possible left sided colitis 2) Mild diverticulosis  RECOMMENDATIONS: 1) await biopsy results 2) Call the office to schedule a followup office visit for _3 weeks  REPEAT EXAM:  No  ______________________________ RSandy Salaam KDeatra Ina MD  CC:  n. eSIGNED:   RSandy Salaam Kaplan at 11/12/2011 03:19 PM  BNorva Pavlov [phone]

## 2011-11-15 ENCOUNTER — Telehealth: Payer: Self-pay | Admitting: *Deleted

## 2011-11-15 NOTE — Telephone Encounter (Signed)
  Follow up Call-  Call back number 11/12/2011 02/26/2011  Post procedure Call Back phone  # [phone]  Permission to leave phone message Yes -     Patient questions:  Do you have a fever, pain , or abdominal swelling? no Pain Score  0 *  Have you tolerated food without any problems? yes  Have you been able to return to your normal activities? yes  Do you have any questions about your discharge instructions: Diet   no Medications  no Follow up visit  no  Do you have questions or concerns about your Care? no  Actions: * If pain score is 4 or above: No action needed, pain <4.

## 2011-11-16 ENCOUNTER — Ambulatory Visit: Payer: BC Managed Care – PPO

## 2011-11-22 ENCOUNTER — Encounter: Payer: Self-pay | Admitting: Gastroenterology

## 2011-11-23 ENCOUNTER — Ambulatory Visit (INDEPENDENT_AMBULATORY_CARE_PROVIDER_SITE_OTHER): Payer: Self-pay

## 2011-11-23 DIAGNOSIS — K5190 Ulcerative colitis, unspecified, without complications: Secondary | ICD-10-CM

## 2011-11-30 ENCOUNTER — Ambulatory Visit (INDEPENDENT_AMBULATORY_CARE_PROVIDER_SITE_OTHER): Payer: BC Managed Care – PPO

## 2011-11-30 ENCOUNTER — Encounter: Payer: Self-pay | Admitting: *Deleted

## 2011-11-30 DIAGNOSIS — K5190 Ulcerative colitis, unspecified, without complications: Secondary | ICD-10-CM

## 2011-12-06 ENCOUNTER — Ambulatory Visit: Payer: BC Managed Care – PPO | Admitting: Gastroenterology

## 2011-12-15 ENCOUNTER — Encounter: Payer: Self-pay | Admitting: Internal Medicine

## 2011-12-15 ENCOUNTER — Ambulatory Visit (AMBULATORY_SURGERY_CENTER): Payer: BC Managed Care – PPO | Admitting: Internal Medicine

## 2011-12-15 ENCOUNTER — Encounter: Payer: Self-pay | Admitting: *Deleted

## 2011-12-15 VITALS — BP 130/66 | HR 76 | Temp 96.6°F | Resp 20 | Ht 73.0 in | Wt 190.0 lb

## 2011-12-15 DIAGNOSIS — K5289 Other specified noninfective gastroenteritis and colitis: Secondary | ICD-10-CM

## 2011-12-15 DIAGNOSIS — K625 Hemorrhage of anus and rectum: Secondary | ICD-10-CM

## 2011-12-15 MED ORDER — SODIUM CHLORIDE 0.9 % IV SOLN: 500.0000 mL | INTRAVENOUS | Status: DC

## 2011-12-15 NOTE — Progress Notes (Signed)
Pt tolerated procedure well without sedation.

## 2011-12-15 NOTE — Op Note (Signed)
[facility]. [place], [place]  [address]  FLEXIBLE SIGMOIDOSCOPY PROCEDURE REPORT  PATIENT:  Steven Torres, Steven Torres  MR#:  [id number] BIRTHDATE:  1960/07/03, 51 yrs. old  GENDER:  male  ENDOSCOPIST:  Docia Chuck. Geri Seminole, MD Referred by:  [facility],  PROCEDURE DATE:  12/15/2011 PROCEDURE:  Flexible Sigmoidoscopy, diagnostic ASA CLASS:  Class II INDICATIONS:  research, assess known ulcerative colitis  MEDICATIONS:   none  DESCRIPTION OF PROCEDURE:   After the risks benefits and alternatives of the procedure were thoroughly explained, informed consent was obtained.  Digital rectal exam was performed and revealed no abnormalities.   The LB-PCF-Q180AL [id number] endoscope was introduced through the anus and advanced to the sigmoid colon, without limitations.  The quality of the prep was good.  The instrument was then slowly withdrawn as the mucosa was fully examined. <<PROCEDUREIMAGES>>  Mild Colitis was found in the rectum at 8-10cm (granularity only). The examination was otherwise normal to 40cm.   Retroflexed views in the rectum revealed no abnormalities.    The scope was then withdrawn from the patient and the procedure terminated. COMPLICATIONS:  None  ENDOSCOPIC IMPRESSION: 1) Mild focal Colitis in the rectum 2) Otherwise normal examination.  RECOMMENDATIONS: 1) follow-up per [facility] coordinator  ______________________________ Docia Chuck. Geri Seminole, MD  CC:  The Patient, [facility], Florina Ou, MD  n. Lorrin MaisDocia Chuck. Geri Seminole at 12/15/2011 12:58 PM  Norva Pavlov, [phone]

## 2011-12-15 NOTE — Patient Instructions (Addendum)
YOU HAD AN ENDOSCOPIC PROCEDURE TODAY AT THE [facility]: Refer to the procedure report that was given to you for any specific questions about what was found during the examination.  If the procedure report does not answer your questions, please call your gastroenterologist to clarify.  If you requested that your care partner not be given the details of your procedure findings, then the procedure report has been included in a sealed envelope for you to review at your convenience later.  YOU SHOULD EXPECT: Some feelings of bloating in the abdomen. Passage of more gas than usual.  Walking can help get rid of the air that was put into your GI tract during the procedure and reduce the bloating. If you had a lower endoscopy (such as a colonoscopy or flexible sigmoidoscopy) you may notice spotting of blood in your stool or on the toilet paper. If you underwent a bowel prep for your procedure, then you may not have a normal bowel movement for a few days.  DIET: Your first meal following the procedure should be a light meal and then it is ok to progress to your normal diet.  A half-sandwich or bowl of soup is an example of a good first meal.  Heavy or fried foods are harder to digest and may make you feel nauseous or bloated.  Likewise meals heavy in dairy and vegetables can cause extra gas to form and this can also increase the bloating.  Drink plenty of fluids but you should avoid alcoholic beverages for 24 hours.  ACTIVITY: Your care partner should take you home directly after the procedure.  You should plan to take it easy, moving slowly for the rest of the day.  You can resume normal activity the day after the procedure however you should NOT DRIVE or use heavy machinery for 24 hours (because of the sedation medicines used during the test).    SYMPTOMS TO REPORT IMMEDIATELY: A gastroenterologist can be reached at any hour.  During normal business hours, 8:30 AM to 5:00 PM Monday through Friday,  call [phone].  After hours and on weekends, please call the GI answering service at [phone] who will take a message and have the physician on call contact you.   Following lower endoscopy (colonoscopy or flexible sigmoidoscopy):  Excessive amounts of blood in the stool  Significant tenderness or worsening of abdominal pains  Swelling of the abdomen that is new, acute  Fever of 100F or higher  Following upper endoscopy (EGD)  Vomiting of blood or coffee ground material  New chest pain or pain under the shoulder blades  Painful or persistently difficult swallowing  New shortness of breath  Fever of 100F or higher  Black, tarry-looking stools  FOLLOW UP: If any biopsies were taken you will be contacted by phone or by letter within the next 1-3 weeks.  Call your gastroenterologist if you have not heard about the biopsies in 3 weeks.  Our staff will call the home number listed on your records the next business day following your procedure to check on you and address any questions or concerns that you may have at that time regarding the information given to you following your procedure. This is a courtesy call and so if there is no answer at the home number and we have not heard from you through the emergency physician on call, we will assume that you have returned to your regular daily activities without incident.  SIGNATURES/CONFIDENTIALITY: You and/or your care   partner have signed paperwork which will be entered into your electronic medical record.  These signatures attest to the fact that that the information above on your After Visit Summary has been reviewed and is understood.  Full responsibility of the confidentiality of this discharge information lies with you and/or your care-partner.  

## 2011-12-15 NOTE — Progress Notes (Signed)
Patient did not experience any of the following events: a burn prior to discharge; a fall within the facility; wrong site/side/patient/procedure/implant event; or a hospital transfer or hospital admission upon discharge from the facility. (G8907) Patient did not have preoperative order for IV antibiotic SSI prophylaxis. (G8918)  

## 2011-12-16 ENCOUNTER — Telehealth: Payer: Self-pay | Admitting: *Deleted

## 2011-12-16 NOTE — Telephone Encounter (Signed)
No answer, left message to call office if questions or concerns.

## 2011-12-17 ENCOUNTER — Other Ambulatory Visit: Payer: BC Managed Care – PPO | Admitting: Internal Medicine

## 2011-12-17 ENCOUNTER — Other Ambulatory Visit: Payer: Self-pay | Admitting: Internal Medicine

## 2012-01-04 ENCOUNTER — Ambulatory Visit (AMBULATORY_SURGERY_CENTER): Payer: BC Managed Care – PPO | Admitting: Gastroenterology

## 2012-01-04 ENCOUNTER — Encounter: Payer: Self-pay | Admitting: Gastroenterology

## 2012-01-04 VITALS — BP 132/67 | HR 74 | Temp 98.5°F | Resp 20 | Ht 73.0 in | Wt 190.0 lb

## 2012-01-04 DIAGNOSIS — K625 Hemorrhage of anus and rectum: Secondary | ICD-10-CM

## 2012-01-04 DIAGNOSIS — K5150 Left sided colitis without complications: Secondary | ICD-10-CM

## 2012-01-04 MED ORDER — HYDROCORTISONE 100 MG/60ML RE ENEM: 100.0000 mg | ENEMA | Freq: Every day | RECTAL | Status: DC

## 2012-01-04 MED ORDER — PRAMOXINE HCL 1 % RE FOAM: RECTAL | Status: DC

## 2012-01-04 NOTE — Progress Notes (Signed)
Patient did not experience any of the following events: a burn prior to discharge; a fall within the facility; wrong site/side/patient/procedure/implant event; or a hospital transfer or hospital admission upon discharge from the facility. (G8907) Patient did not have preoperative order for IV antibiotic SSI prophylaxis. (G8918)  

## 2012-01-04 NOTE — Patient Instructions (Addendum)
Discharge instructions given with verbal understanding. Resume previous medications. YOU HAD AN ENDOSCOPIC PROCEDURE TODAY AT [facility]: Refer to the procedure report that was given to you for any specific questions about what was found during the examination.  If the procedure report does not answer your questions, please call your gastroenterologist to clarify.  If you requested that your care partner not be given the details of your procedure findings, then the procedure report has been included in a sealed envelope for you to review at your convenience later.  YOU SHOULD EXPECT: Some feelings of bloating in the abdomen. Passage of more gas than usual.  Walking can help get rid of the air that was put into your GI tract during the procedure and reduce the bloating. If you had a lower endoscopy (such as a colonoscopy or flexible sigmoidoscopy) you may notice spotting of blood in your stool or on the toilet paper. If you underwent a bowel prep for your procedure, then you may not have a normal bowel movement for a few days.  DIET: Your first meal following the procedure should be a light meal and then it is ok to progress to your normal diet.  A half-sandwich or bowl of soup is an example of a good first meal.  Heavy or fried foods are harder to digest and may make you feel nauseous or bloated.  Likewise meals heavy in dairy and vegetables can cause extra gas to form and this can also increase the bloating.  Drink plenty of fluids but you should avoid alcoholic beverages for 24 hours.  ACTIVITY: Your care partner should take you home directly after the procedure.  You should plan to take it easy, moving slowly for the rest of the day.  You can resume normal activity the day after the procedure however you should NOT DRIVE or use heavy machinery for 24 hours (because of the sedation medicines used during the test).    SYMPTOMS TO REPORT IMMEDIATELY: A gastroenterologist can be reached  at any hour.  During normal business hours, 8:30 AM to 5:00 PM Monday through Friday, call [phone].  After hours and on weekends, please call the GI answering service at [phone] who will take a message and have the physician on call contact you.   Following lower endoscopy (colonoscopy or flexible sigmoidoscopy):  Excessive amounts of blood in the stool  Significant tenderness or worsening of abdominal pains  Swelling of the abdomen that is new, acute  Fever of 100F or higher  FOLLOW UP: If any biopsies were taken you will be contacted by phone or by letter within the next 1-3 weeks.  Call your gastroenterologist if you have not heard about the biopsies in 3 weeks.  Our staff will call the home number listed on your records the next business day following your procedure to check on you and address any questions or concerns that you may have at that time regarding the information given to you following your procedure. This is a courtesy call and so if there is no answer at the home number and we have not heard from you through the emergency physician on call, we will assume that you have returned to your regular daily activities without incident.  SIGNATURES/CONFIDENTIALITY: You and/or your care partner have signed paperwork which will be entered into your electronic medical record.  These signatures attest to the fact that that the information above on your After Visit Summary has been reviewed and is  understood.  Full responsibility of the confidentiality of this discharge information lies with you and/or your care-partner.

## 2012-01-04 NOTE — Op Note (Signed)
[facility]. [place], [address]   FLEXIBLE SIGMOIDOSCOPY PROCEDURE REPORT  PATIENT: Steven Torres, Steven Torres  MR#: [id number] BIRTHDATE: February 10, 1961 , 51  yrs. old GENDER: Male ENDOSCOPIST: Inda Castle, MD REFERRED BY: Florina Ou, M.D. PROCEDURE DATE:  01/04/2012 PROCEDURE:   Sigmoidoscopy, diagnostic ASA CLASS:   Class II INDICATIONS:Per study protocol. MEDICATIONS:  DESCRIPTION OF PROCEDURE:   After the risks benefits and alternatives of the procedure were thoroughly explained, informed consent was obtained.  revealed no abnormalities of the rectum. The LB-PCF-H180AL [id number]  endoscope was introduced through the anus and advanced to the descending colon , limited by No adverse events experienced.   The quality of the prep was    .  The instrument was then slowly withdrawn as the mucosa was fully examined.     1.  COLON FINDINGS: Abnormal mucosa was found in the rectosigmoid colon. The mucosa was erythematous and had granularity. 2.  There was mild colitis in the rectum the sigmoid extending to almost 30 cm consisting of mild nodularity and erythema.: Proximal to the sigmoid colon was normal. Retroflexion was not performed.    The scope was then withdrawn from the patient and the procedure terminated.  COMPLICATIONS: There were no complications.  ENDOSCOPIC IMPRESSION: 1.   Abnormal mucosa was found in the rectosigmoid colon 2.   There was mild colitis in the rectum and sigmoid extending to almost 30 cm consisting of mild nodularity and erythema.: Proximal to the sigmoid colon was normal.  RECOMMENDATIONS: 1.  5-ASA Enemas 2.  Cortenemas   REPEAT EXAM:   _______________________________ eSignedInda Castle, MD 01/04/2012 11:15 AM   CC:

## 2012-01-04 NOTE — Progress Notes (Signed)
Pt has no IV and will not receive sedation for the flex sig.  Riki Sheer, RN research nurse also in the procedure room for the exam.  The pt tolerated the flex sig very well.  No discomfort noted. Maw

## 2012-01-05 ENCOUNTER — Telehealth: Payer: Self-pay

## 2012-01-05 ENCOUNTER — Other Ambulatory Visit: Payer: Self-pay | Admitting: *Deleted

## 2012-01-05 MED ORDER — MESALAMINE 4 G RE ENEM: 4.0000 g | ENEMA | Freq: Every day | RECTAL | Status: DC

## 2012-01-05 NOTE — Telephone Encounter (Signed)
Called [phone] and the a.m. Picked up.  It did not say I could leave a message.  No message left. Maw

## 2012-02-03 ENCOUNTER — Ambulatory Visit: Payer: Self-pay

## 2012-02-03 ENCOUNTER — Other Ambulatory Visit (INDEPENDENT_AMBULATORY_CARE_PROVIDER_SITE_OTHER): Payer: BC Managed Care – PPO | Admitting: Gastroenterology

## 2012-02-03 DIAGNOSIS — K5190 Ulcerative colitis, unspecified, without complications: Secondary | ICD-10-CM

## 2012-02-03 MED ORDER — ALPRAZOLAM 0.5 MG PO TABS: 0.5000 mg | ORAL_TABLET | Freq: Three times a day (TID) | ORAL | Status: DC | PRN

## 2012-02-03 MED ORDER — MESALAMINE 1.2 G PO TBEC: 2.4000 g | DELAYED_RELEASE_TABLET | Freq: Every day | ORAL | Status: DC

## 2012-02-13 ENCOUNTER — Other Ambulatory Visit: Payer: Self-pay | Admitting: Gastroenterology

## 2012-03-13 ENCOUNTER — Encounter: Payer: Self-pay | Admitting: Gastroenterology

## 2012-03-13 ENCOUNTER — Ambulatory Visit (INDEPENDENT_AMBULATORY_CARE_PROVIDER_SITE_OTHER): Payer: BC Managed Care – PPO | Admitting: Gastroenterology

## 2012-03-13 VITALS — BP 136/84 | HR 64 | Ht 73.0 in | Wt 196.6 lb

## 2012-03-13 DIAGNOSIS — K5289 Other specified noninfective gastroenteritis and colitis: Secondary | ICD-10-CM

## 2012-03-13 DIAGNOSIS — K5150 Left sided colitis without complications: Secondary | ICD-10-CM | POA: Insufficient documentation

## 2012-03-13 NOTE — Patient Instructions (Addendum)
Follow up in 6 months Take Lialda 2 once a day

## 2012-03-13 NOTE — Progress Notes (Signed)
History of Present Illness:  Mr. Steven Torres has returned for followup of left-sided colitis. He was in a research study. Upon finishing the study he was placed on lialda 2.4 g a day. Symptoms of rectal bleeding and passage of mucus subsided while on lialda. He ran out of medications about 10 days ago.  He reports recurrence of bleeding and mucus. Stools are solid. He denies urgency.    Review of Systems: Pertinent positive and negative review of systems were noted in the above HPI section. All other review of systems were otherwise negative.    Current Medications, Allergies, Past Medical History, Past Surgical History, Family History and Social History were reviewed in [facility] record  Vital signs were reviewed in today's medical record. Physical Exam: General: Well developed , well nourished, no acute distress

## 2012-03-13 NOTE — Assessment & Plan Note (Addendum)
Patient has responded well to lialda and has noticed a recurrence of after he ran out of medications.  Recommendations #1 restart lialda 2.4 g daily. I told him that he could try lowering  to 1.2 g daily after a couple of month's therapy provided that he remains in remission during that time.

## 2012-09-04 ENCOUNTER — Encounter: Payer: Self-pay | Admitting: Gastroenterology

## 2012-09-04 ENCOUNTER — Other Ambulatory Visit (INDEPENDENT_AMBULATORY_CARE_PROVIDER_SITE_OTHER): Payer: BC Managed Care – PPO

## 2012-09-04 ENCOUNTER — Ambulatory Visit (INDEPENDENT_AMBULATORY_CARE_PROVIDER_SITE_OTHER): Payer: BC Managed Care – PPO | Admitting: Gastroenterology

## 2012-09-04 VITALS — BP 124/66 | HR 68 | Ht 72.0 in | Wt 199.0 lb

## 2012-09-04 DIAGNOSIS — K5190 Ulcerative colitis, unspecified, without complications: Secondary | ICD-10-CM

## 2012-09-04 DIAGNOSIS — K5150 Left sided colitis without complications: Secondary | ICD-10-CM

## 2012-09-04 DIAGNOSIS — K5289 Other specified noninfective gastroenteritis and colitis: Secondary | ICD-10-CM

## 2012-09-04 LAB — CBC WITH DIFFERENTIAL/PLATELET
Basophils Relative: 0.4 % (ref 0.0–3.0)
Eosinophils Relative: 3.3 % (ref 0.0–5.0)
HCT: 41.1 % (ref 39.0–52.0)
Hemoglobin: 14.2 g/dL (ref 13.0–17.0)
Lymphs Abs: 1.7 10*3/uL (ref 0.7–4.0)
MCV: 91.8 fl (ref 78.0–100.0)
Monocytes Absolute: 0.5 10*3/uL (ref 0.1–1.0)
Neutro Abs: 6 10*3/uL (ref 1.4–7.7)
Neutrophils Relative %: 69.8 % (ref 43.0–77.0)
RBC: 4.48 Mil/uL (ref 4.22–5.81)
WBC: 8.6 10*3/uL (ref 4.5–10.5)

## 2012-09-04 LAB — COMPREHENSIVE METABOLIC PANEL
Albumin: 4.2 g/dL (ref 3.5–5.2)
Alkaline Phosphatase: 77 U/L (ref 39–117)
BUN: 13 mg/dL (ref 6–23)
Creatinine, Ser: 1.1 mg/dL (ref 0.4–1.5)
Glucose, Bld: 106 mg/dL — ABNORMAL HIGH (ref 70–99)
Potassium: 3.8 mEq/L (ref 3.5–5.1)

## 2012-09-04 NOTE — Assessment & Plan Note (Signed)
He remains in clinical remission on Lialda.  Medications #1 continue lialda. He may try lowered it to 1.2 g daily #2 check CBC and apprehensive metabolic profile today, BMET in 6 months and 12 months

## 2012-09-04 NOTE — Patient Instructions (Addendum)
You will go to the basement for labs today You will need follow up labs in 6 months then again in 1 year  CBC CMET  And a follow up in 1 year

## 2012-09-04 NOTE — Progress Notes (Signed)
History of Present Illness:  The patient has returned for followup of left-sided colitis. On lialda 2.4 g daily he is symptom-free. He rarely has seen blood streaking stools. Stools are solid. He is without abdominal or rectal pain.    Review of Systems: Pertinent positive and negative review of systems were noted in the above HPI section. All other review of systems were otherwise negative.    Current Medications, Allergies, Past Medical History, Past Surgical History, Family History and Social History were reviewed in [facility] record  Vital signs were reviewed in today's medical record. Physical Exam: General: Well developed , well nourished, no acute distress

## 2012-10-26 ENCOUNTER — Encounter: Payer: Self-pay | Admitting: Family Medicine

## 2012-10-26 ENCOUNTER — Ambulatory Visit (INDEPENDENT_AMBULATORY_CARE_PROVIDER_SITE_OTHER): Payer: BC Managed Care – PPO | Admitting: Family Medicine

## 2012-10-26 ENCOUNTER — Ambulatory Visit (HOSPITAL_BASED_OUTPATIENT_CLINIC_OR_DEPARTMENT_OTHER)
Admission: RE | Admit: 2012-10-26 | Discharge: 2012-10-26 | Disposition: A | Discharge status: Home / Self Care | Payer: BC Managed Care – PPO | Source: Ambulatory Visit | Attending: Family Medicine | Admitting: Family Medicine

## 2012-10-26 VITALS — BP 130/75 | HR 79 | Ht 73.0 in | Wt 190.0 lb

## 2012-10-26 DIAGNOSIS — M25569 Pain in unspecified knee: Secondary | ICD-10-CM

## 2012-10-26 DIAGNOSIS — M25561 Pain in right knee: Secondary | ICD-10-CM

## 2012-10-26 IMAGING — CR DG KNEE AP/LAT W/ SUNRISE*R*
2 series · 2 of 2 positions shown · non-contrast
Comparison: None

CLINICAL DATA: Pain.  No injury.

DG KNEE - 3 VIEWS

[w knee ap right *]
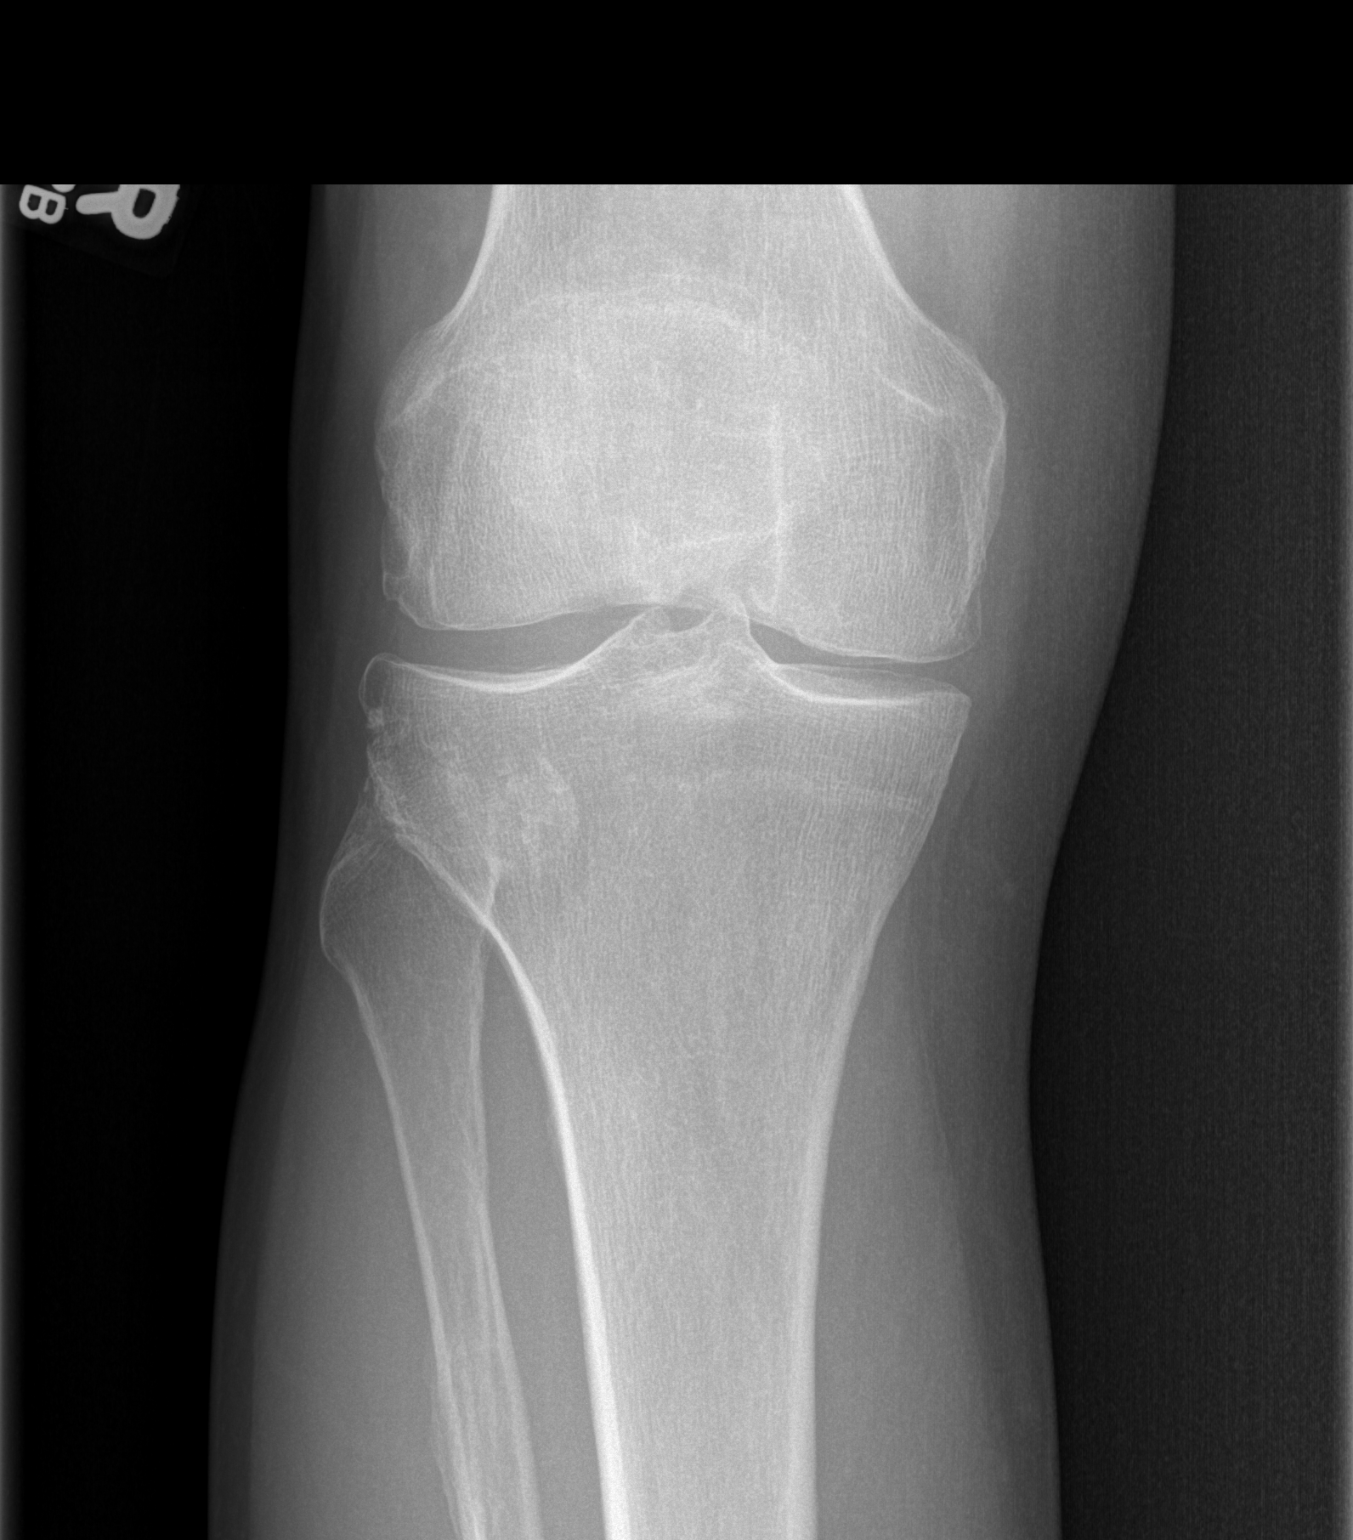

[w knee lat. right *]
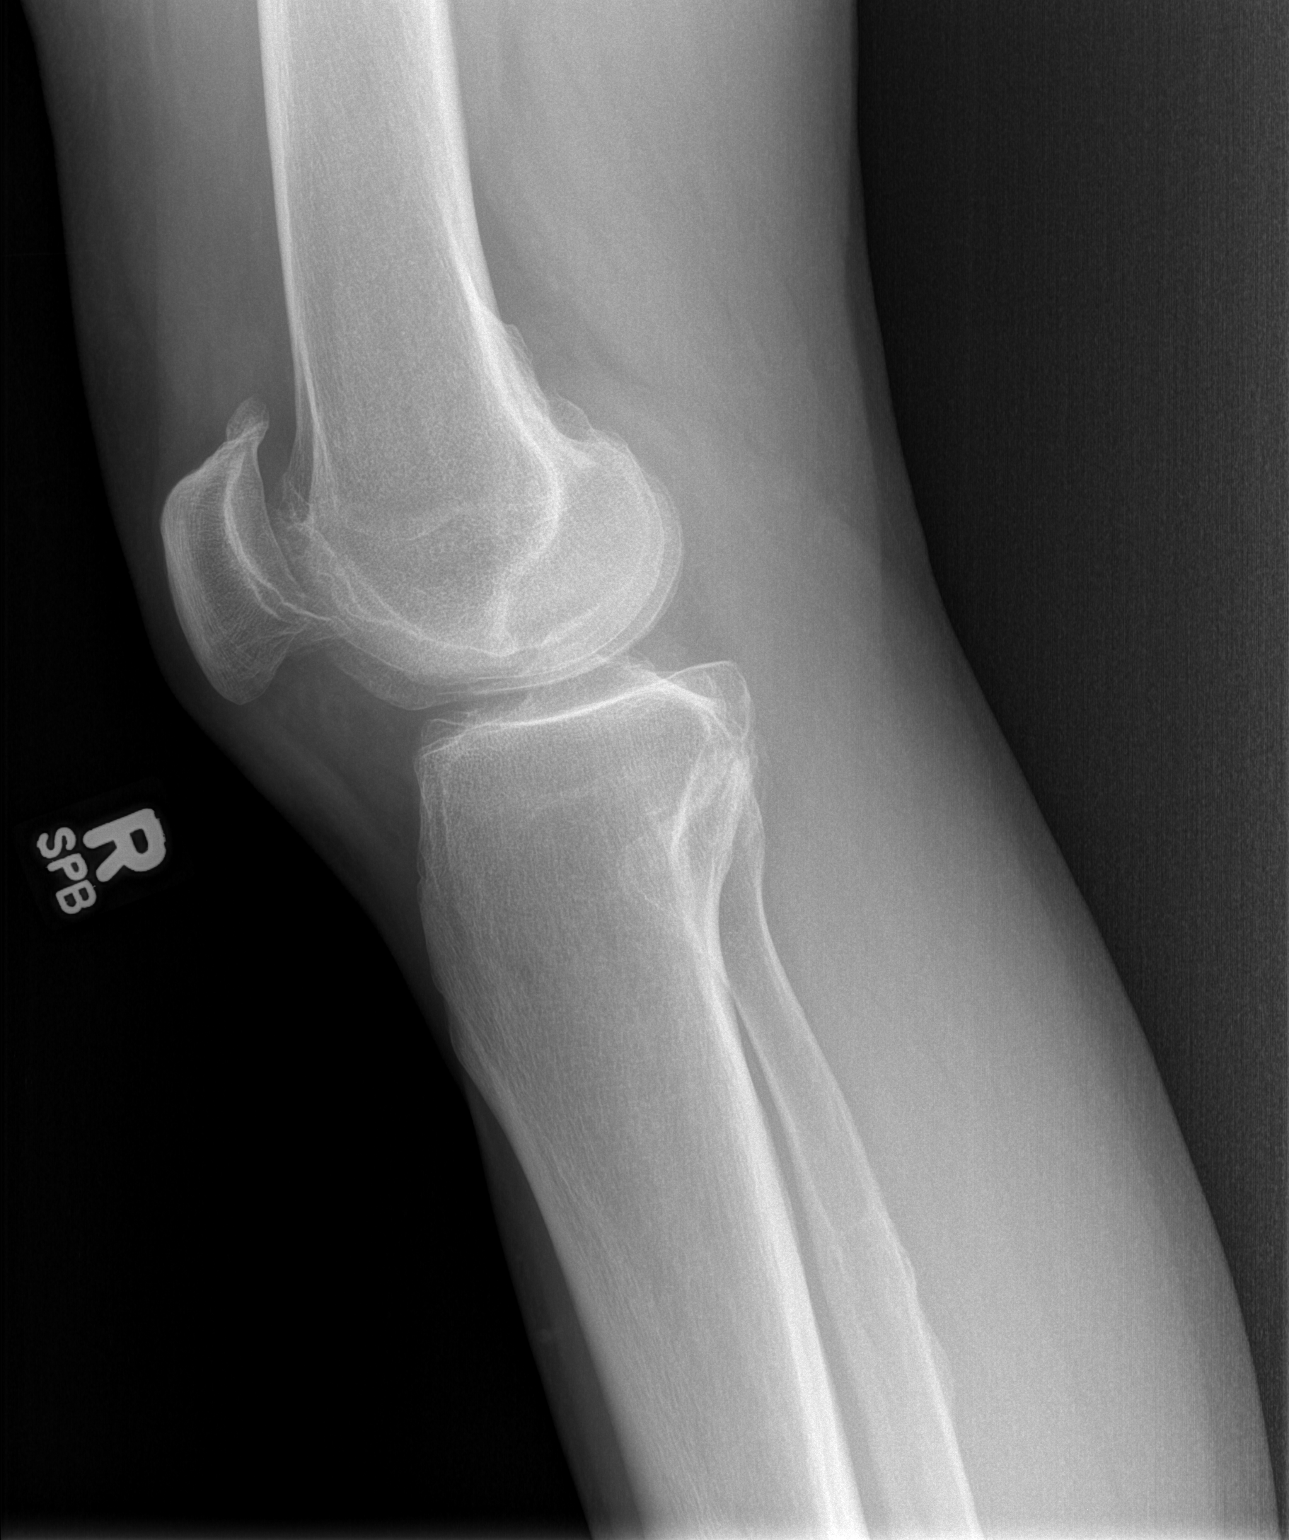

[2 of 2 positions shown; findings below may reference images not displayed]

FINDINGS: Moderate patellofemoral joint degenerative changes.

Mild medial patellofemoral joint degenerative changes.

There may be a tiny suprapatellar joint effusion..
IMPRESSION: Moderate patellofemoral joint degenerative changes.

Mild medial patellofemoral joint degenerative changes.

There may be a tiny suprapatellar joint effusion.

## 2012-10-26 IMAGING — CR DG KNEE AP/LAT W/ SUNRISE*R*
1 series · 1 of 1 positions shown · non-contrast
Comparison: None

CLINICAL DATA: Pain.  No injury.

DG KNEE - 3 VIEWS

[view not recorded]
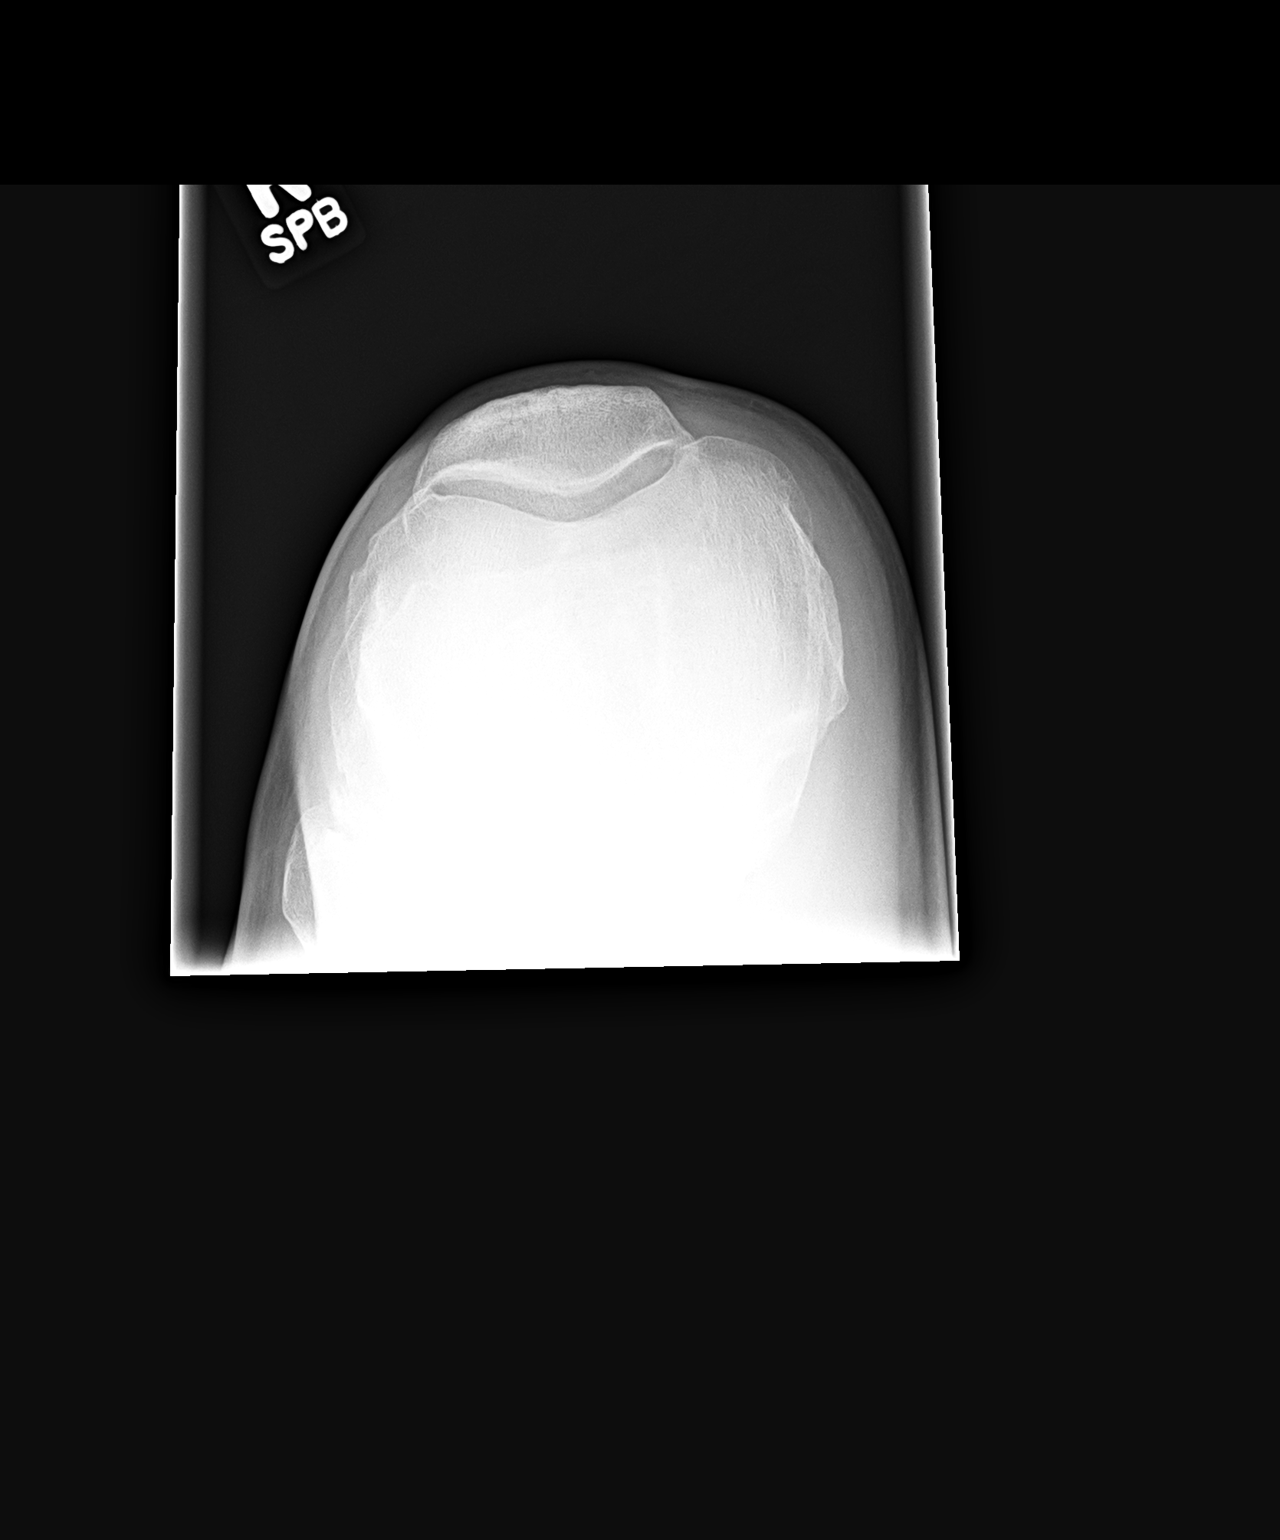

[1 of 1 positions shown; findings below may reference images not displayed]

FINDINGS: Moderate patellofemoral joint degenerative changes.

Mild medial patellofemoral joint degenerative changes.

There may be a tiny suprapatellar joint effusion..
IMPRESSION: Moderate patellofemoral joint degenerative changes.

Mild medial patellofemoral joint degenerative changes.

There may be a tiny suprapatellar joint effusion.

## 2012-10-26 NOTE — Patient Instructions (Addendum)
Your pain is likely due to arthritis, less likely a degenerative meniscal tear. Get the x-rays before you leave today - we will call you with the results. Take tylenol 566m 1-2 tabs three times a day for pain as first line treatment. Aleve 1-2 tabs twice a day with food as second line. Glucosamine sulfate 7578mtwice a day is a supplement that may help moderate to severe arthritis. Capsaicin topically up to four times a day may also help with pain. Cortisone injections are an option. If cortisone injections do not help, there are different types of shots that may help but they take longer to take effect (viscosupplementation). It's important that you continue to stay active. Consider physical therapy to strengthen muscles around the joint that hurts to take pressure off of the joint itself. Start straight leg raises and knee extensions 3 sets of 10 once a day (can add ankle weight if these become too easy). Shoe inserts with good arch support may be helpful. Heat or ice 15 minutes at a time 3-4 times a day as needed to help with pain. Swimming and cycling with low resistance are the best two types of exercise for arthritis. Follow up with me in 1 month or as needed.

## 2012-10-30 ENCOUNTER — Encounter: Payer: Self-pay | Admitting: Family Medicine

## 2012-10-30 DIAGNOSIS — M25561 Pain in right knee: Secondary | ICD-10-CM | POA: Insufficient documentation

## 2012-10-30 NOTE — Progress Notes (Signed)
Patient ID: Steven Torres, male   DOB: 1961-02-22, 52 y.o.   MRN: [id number]  PCP: Florina Ou, MD  Subjective:   HPI: Patient is a 52 y.o. male here for right knee pain.  Patient reports at least 20 years ago he was told he did not have an ACL in right knee - had an arthroscopy at that time with debridement. He used to play a lot of softball and would get swelling but not much pain after playing. States past couple days after walking dog on a trail knee would be inflamed, a little swollen. No injury. Pain worse when standing after prolonged sitting. Recently walked all around [place] and didn't have any problems. Has some difficulty straightening out knee. Has been icing, taking ibuprofen.  Past Medical History  Diagnosis Date  . GERD (gastroesophageal reflux disease)   . Hyperlipidemia   . Ulcerative colitis     Current Outpatient Prescriptions on File Prior to Visit  Medication Sig Dispense Refill  . mesalamine (LIALDA) 1.2 G EC tablet Take 2 tablets (2.4 g total) by mouth daily with breakfast.  60 tablet  3  . ALPRAZolam (XANAX) 0.5 MG tablet Take 1 tablet (0.5 mg total) by mouth 3 (three) times daily as needed.  30 tablet  2  . aspirin 81 MG tablet Take 81 mg by mouth daily.        . cholecalciferol (VITAMIN D) 1000 UNITS tablet Take 2,000 Units by mouth 2 (two) times daily.        . fish oil-omega-3 fatty acids 1000 MG capsule Take 3,000 g by mouth daily.        . Multiple Vitamin (MULTIVITAMIN) capsule Take 1 capsule by mouth 2 (two) times daily.         Current Facility-Administered Medications on File Prior to Visit  Medication Dose Route Frequency Provider Last Rate Last Dose  . 0.9 %  sodium chloride infusion  500 mL Intravenous Continuous Irene Shipper, MD        Past Surgical History  Procedure Laterality Date  . Back surgery    . Hernia repair    . Knee arthroscopy      right  . Wisdom tooth extraction      No Known Allergies  History   Social  History  . Marital Status: Married    Spouse Name: N/A    Number of Children: 2  . Years of Education: N/A   Occupational History  . Account Manager    Social History Main Topics  . Smoking status: Never Smoker   . Smokeless tobacco: Never Used  . Alcohol Use: No  . Drug Use: No  . Sexually Active: Not on file   Other Topics Concern  . Not on file   Social History Narrative  . No narrative on file    Family History  Problem Relation Age of Onset  . Prostate cancer Father   . Colon polyps Mother     benign  . Colon cancer Neg Hx   . Rectal cancer Neg Hx   . Stomach cancer Neg Hx     BP 130/75  Pulse 79  Ht 6' 1"  (1.854 m)  Wt 190 lb (86.183 kg)  BMI 25.07 kg/m2  Review of Systems: See HPI above.    Objective:  Physical Exam:  Gen: NAD  R knee: No gross deformity, ecchymoses, swelling.   Mild TTP medial joint line.  No lateral joint line TTP.  Minimal post  patellar facet TTP. FROM. Negative ant/post drawers. Negative valgus/varus testing. Negative lachmanns. Negative mcmurrays, apleys, patellar apprehension. NV intact distally.    Assessment & Plan:  1. Right knee pain - radiographs show mild medial DJD, moderate PF DJD.  Pain likely due to this vs degenerative medial meniscal tear.  Start with conservative care - tylenol, nsaids, glucosamine, capsaicin, quad strengthening.  Declined injection and formal PT for now.  F/u in 1 month for reevaluation.

## 2012-10-30 NOTE — Assessment & Plan Note (Signed)
radiographs show mild medial DJD, moderate PF DJD.  Pain likely due to this vs degenerative medial meniscal tear.  Start with conservative care - tylenol, nsaids, glucosamine, capsaicin, quad strengthening.  Declined injection and formal PT for now.  F/u in 1 month for reevaluation.

## 2014-09-02 ENCOUNTER — Other Ambulatory Visit: Payer: Self-pay | Admitting: Urology

## 2014-09-04 ENCOUNTER — Encounter (HOSPITAL_COMMUNITY): Payer: Self-pay | Admitting: *Deleted

## 2014-09-04 NOTE — H&P (Signed)
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 The pain radiates from the left lower back/flank area to the left side of the groin. He previously endorses 2 similar episodes of what he describes as severe and unrelenting sharp pain on Friday night and Saturday morning. He states nothing makes the pain better and has not tried any alleviating factors such as position change or analgesia. His pain is associated with nausea and vomiting. He denies fever, gross hematuria or other associated LUTS. He states having passed a kidney stone in the past 5-7 years but has not had any imaging or urological evaluation for such.     UA 7-10 red blood cells, rare bacteria     Discussed nature, r/b/a to CT scan-stone protocol today and give Ketorolac 1m IM for his acute pain.    Patient's pain resolved after Toradol.    CT revealed a 9 mm left proximal stone (12 cm SSD, about 900 Hounsfield units, ???visible on scout) with hydronephrosis, there are a few stones in the left kidney the two largest measuring 4-5 mm.    I sent him for a KUB to look for the ureteral stones and renal stones to help counsel.   Past Medical History Problems  1. History of esophageal reflux (Z87.19) 2. History of hypercholesterolemia (Z86.39) 3. History of kidney stones (Z87.442) 4. History of ulcerative colitis (Z87.19)  Surgical History Problems  1. History of Back Surgery 2. History of Back Surgery 3. History of Hernia Repair 4. History of Knee Surgery 5. History of Knee Surgery Right 6. History of Surgery Of Male Genitalia Vasectomy  Current Meds 1. Aspirin 81 MG TABS;  Therapy: (Recorded:25Jul2013) to Recorded 2. Fish Oil CAPS;  Therapy: (Recorded:05Sep2014) to Recorded 3. Glucosamine Chondroitin TABS;  Therapy: (Recorded:05Sep2014) to Recorded 4. Multi Vitamin/Minerals TABS;   Therapy: (Recorded:05Sep2014) to Recorded 5. Vitamin D TABS;  Therapy: (Recorded:05Sep2014) to Recorded  Allergies Medication  1. No Known Drug Allergies  Family History Problems  1. Family history of Family Health Status - Father's Age   age [id number] 2 Family history of Family Health Status - Mother's Age   age 54 3 Family history of Family Health Status Number Of Children   1 daughter 4. Family history of Prostate Cancer : Father  Social History Problems  1. Activities Of Daily Living 2. Denied: History of Alcohol Use 3. Caffeine Use   2 per day 4. Exercise Habits   Patient is active at the gym with exercise for light weights and also resistive training for     upper extremities lower Zocor. 5. Living Independently With Spouse 6. Marital History - Currently Married 7. Never A Smoker 8. 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 Self-reliant In Usual Daily Activities  Review of Systems Genitourinary, constitutional, skin, eye, otolaryngeal, hematologic/lymphatic, cardiovascular, pulmonary, endocrine, musculoskeletal, gastrointestinal, neurological and psychiatric system(s) were reviewed and pertinent findings if present are noted and are otherwise negative.  Gastrointestinal: nausea, vomiting, flank pain and abdominal pain.  Musculoskeletal: back pain.    Vitals Vital Signs [Data Includes: Last 1 Day]  Recorded: [id number]:00AM  Blood Pressure: 148 / 78 Temperature: 98 F Heart Rate: 50  Physical Exam Constitutional: Well nourished and well developed . Acute pain.  ENT:. The ears and nose are normal in appearance.  Neck: The appearance of the neck is normal and no neck mass is present.  Pulmonary: No  respiratory distress and normal respiratory rhythm and effort.  Cardiovascular: Heart rate and rhythm are normal . No peripheral edema.  Abdomen: The abdomen is soft and nontender. No masses are palpated. Left CVA tenderness. No hernias are palpable. No hepatosplenomegaly noted.   Genitourinary: Examination of the penis demonstrates no discharge, no masses, no lesions and a normal meatus. The scrotum is without lesions. The right epididymis is palpably normal and non-tender. The left epididymis is palpably normal and non-tender. The right testis is non-tender and without masses. The left testis is non-tender and without masses.  Lymphatics: The femoral and inguinal nodes are not enlarged or tender.  Skin: Normal skin turgor, no visible rash and no visible skin lesions.  Neuro/Psych:. Mood and affect are appropriate.    Results/Data Urine [Data Includes: Last 1 Day]   [id number]  COLOR YELLOW   APPEARANCE CLEAR   SPECIFIC GRAVITY 1.030   pH 5.0   GLUCOSE NEG mg/dL  BILIRUBIN NEG   KETONE TRACE mg/dL  BLOOD LARGE   PROTEIN TRACE mg/dL  UROBILINOGEN 0.2 mg/dL  NITRITE NEG   LEUKOCYTE ESTERASE NEG   SQUAMOUS EPITHELIAL/HPF RARE   WBC 0-2 WBC/hpf  RBC 7-10 RBC/hpf  BACTERIA RARE   CRYSTALS NONE SEEN   CASTS NONE SEEN   Other MUCUS  AND AMORPHOUS NOTED    The following clinical lab reports were reviewed:  UA positive for 7-10 RBC/HPF and mucous. Will send for culture.    Procedure  KUB today-comparison to prior CT. The stone is visible on the left proximal ureter. A localizing arrow was placed. I did not appreciate the stones in the left kidney although the lower pole stone may be visible. The burns appeared normal. The bowel gas pattern appeared normal. The right kidney shadow appeared normal.1       1 Amended By: Festus Aloe; Sep 02 2014 5:16 PM EST  Assessment Assessed  1. Renal colic (P61) 2. Microscopic hematuria (R31.2) 3. Left ureteral stone (N20.1)  Plan  Health Maintenance  1. UA With REFLEX; [Do Not Release]; Status:Complete;   Done: [id number] 11:10AM Left ureteral stone  2. Start: Oxycodone-Acetaminophen 5-325 MG Oral Tablet; TAKE 1 TO 2 TABLETS EVERY 4  TO 6 HOURS AS NEEDED FOR PAIN 3. KUB; Status:Resulted - Requires Verification;    Done: [id number] 38:25KN Renal colic  4. Administered: Ketorolac Tromethamine 60 MG/2ML Intramuscular Solution 5. AU CT-STONE PROTOCOL; Status:Resulted - Requires Verification;   Done: [id number]  12:00AM  Discussion/Summary Left ureteral stone -- had a long discussion with the patient and his wife about the nature risks benefits and alternatives to left shockwave lithotripsy. We discussed alternatives such as continued surveillance, stone passage, ureteroscopy. We did discuss ureteroscopy has higher success rate but is more invasive. It could also be used to get the left kidney stones clear. All questions. He would like to proceed with minimally invasive option and I'll set him up for shockwave lithotripsy of the left ureteral stone. I did send urine for culture as a precaution. I gave him pain medicines. He needs 48 hours in between the ketorolac administration and shockwave. Thursday should work well.       cc: Dr. Louis Meckel;  Dr. Levada Schilling Electronically signed by : Festus Aloe, M.D.; Sep 02 2014  5:17PM EST

## 2014-09-05 ENCOUNTER — Ambulatory Visit (HOSPITAL_COMMUNITY)
Admission: RE | Admit: 2014-09-05 | Discharge: 2014-09-05 | Disposition: A | Discharge status: Home / Self Care | Payer: BLUE CROSS/BLUE SHIELD | Source: Ambulatory Visit | Attending: Urology | Admitting: Urology

## 2014-09-05 ENCOUNTER — Encounter (HOSPITAL_COMMUNITY)
Admission: RE | Disposition: A | Discharge status: Home / Self Care | Payer: Self-pay | Source: Ambulatory Visit | Attending: Urology

## 2014-09-05 ENCOUNTER — Encounter (HOSPITAL_COMMUNITY): Payer: Self-pay | Admitting: General Practice

## 2014-09-05 ENCOUNTER — Ambulatory Visit (HOSPITAL_COMMUNITY): Payer: BLUE CROSS/BLUE SHIELD

## 2014-09-05 DIAGNOSIS — N201 Calculus of ureter: Secondary | ICD-10-CM | POA: Diagnosis not present

## 2014-09-05 HISTORY — DX: Calculus of kidney: N20.0

## 2014-09-05 HISTORY — DX: Chronic kidney disease, unspecified: N18.9

## 2014-09-05 IMAGING — CR DG ABDOMEN 1V
2 series · 2 of 2 positions shown · non-contrast
Comparison: 09/02/2014

CLINICAL DATA: Pre lithotripsy.

EXAM:
ABDOMEN - 1 VIEW

[t abdomen supine (1 of 2)]
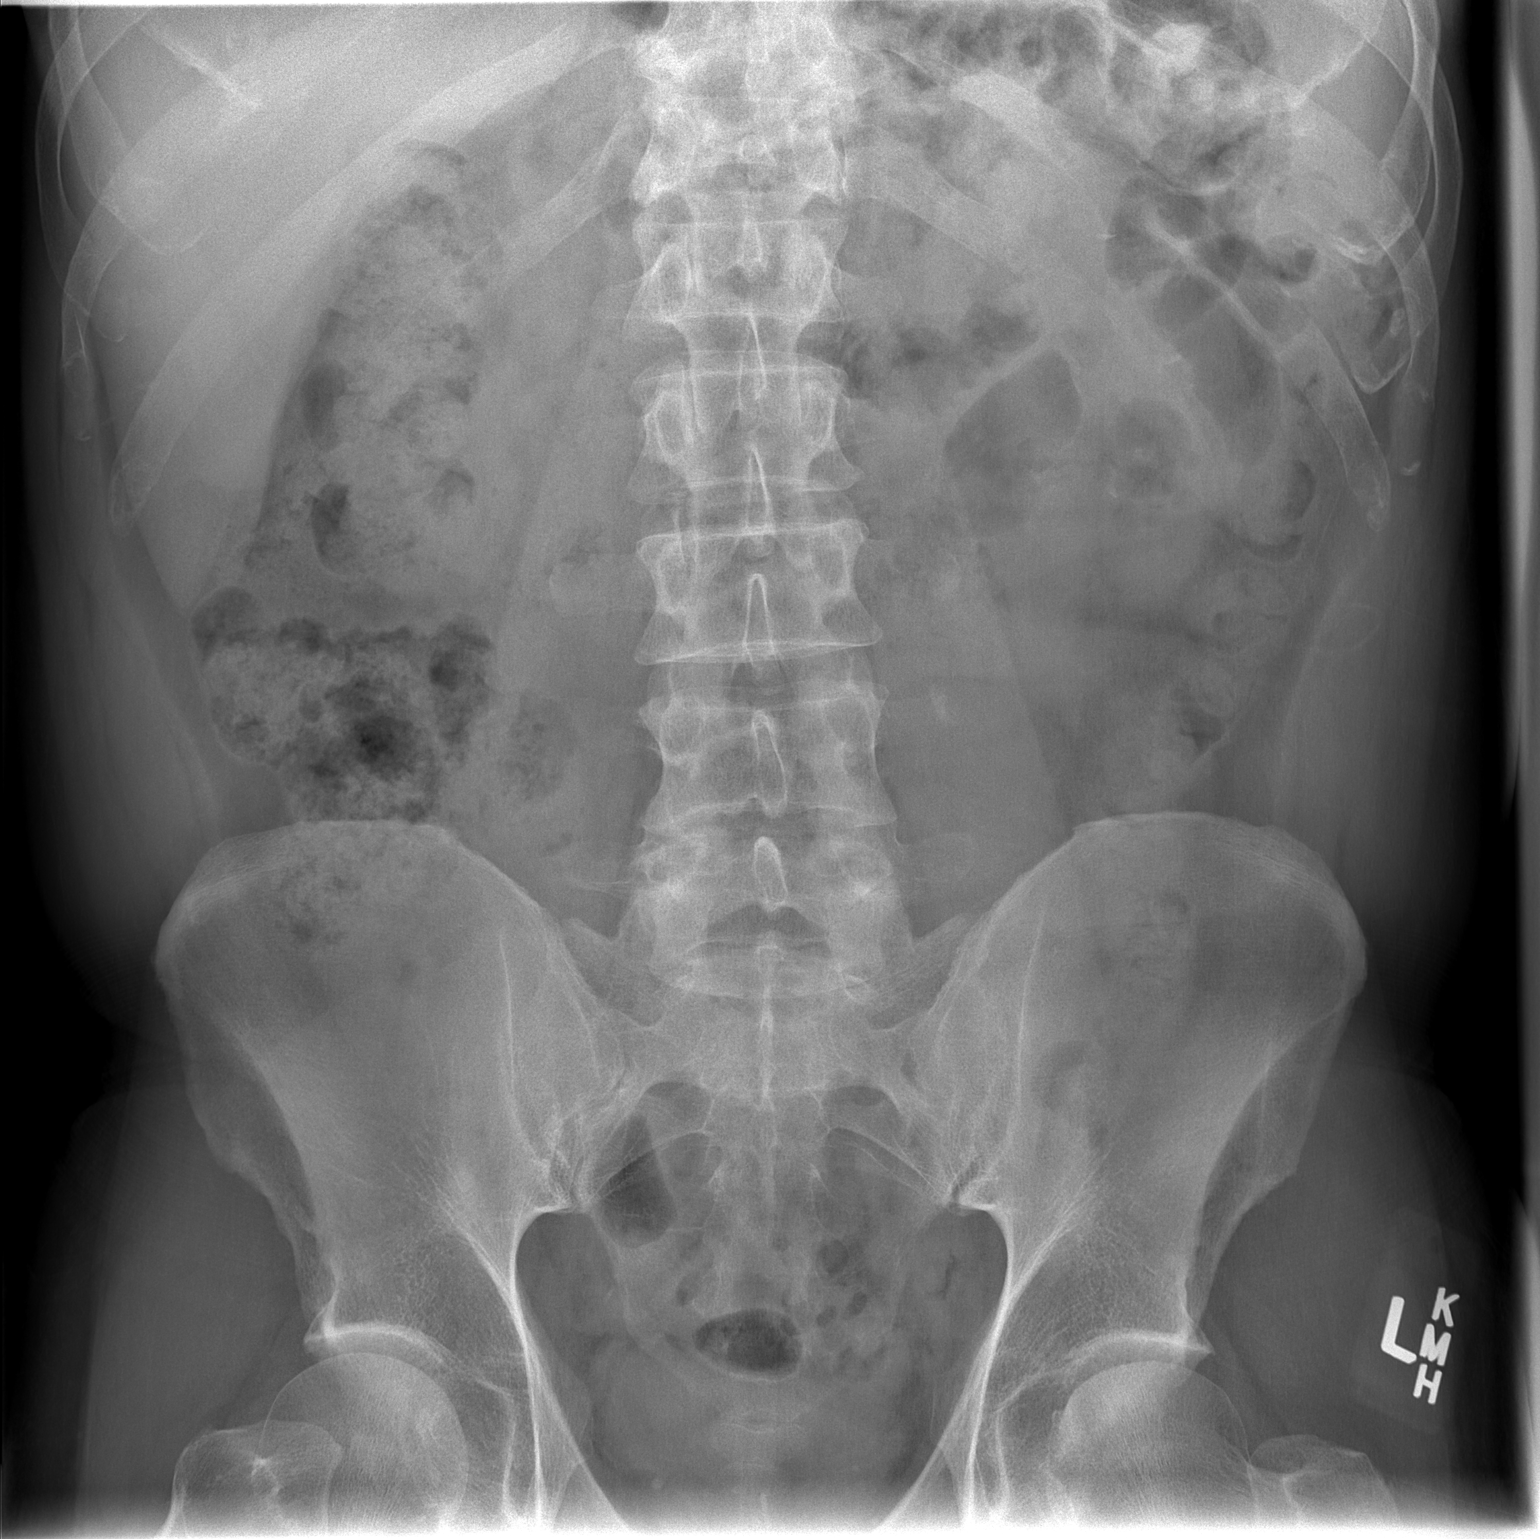

[t abdomen supine (2 of 2)]
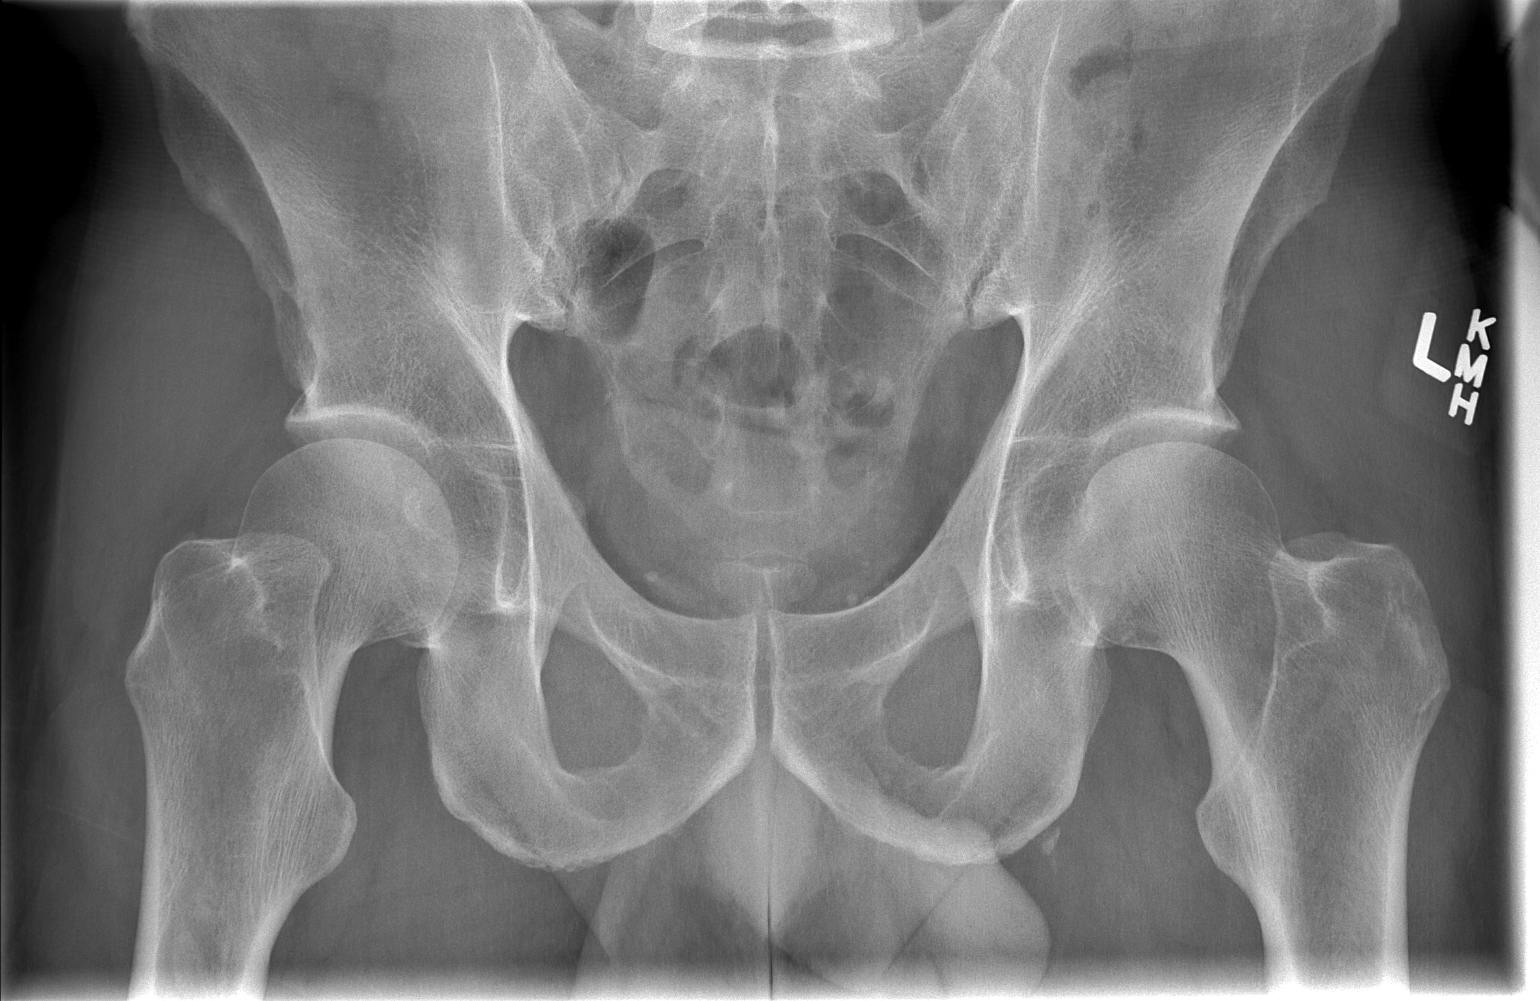

[2 of 2 positions shown; findings below may reference images not displayed]

FINDINGS: Again noted are small calculi in the left kidney. There is a poorly
defined calcification overlying the left L4 transverse process which
likely represents the known left ureter stone. This stone roughly
measures 1.1 cm in length. Nonobstructive bowel gas pattern with a
moderate amount of stool in the right colon. Small phleboliths in
the pelvis. No acute bone abnormality.
IMPRESSION: Stable position of the left ureter stone.

Left renal calculi.

## 2014-09-05 SURGERY — LITHOTRIPSY, ESWL
Anesthesia: LOCAL | Laterality: Left

## 2014-09-05 MED ORDER — SODIUM CHLORIDE 0.9 % IV SOLN
INTRAVENOUS | Status: DC
  Administered 2014-09-05: 09:00:00 via INTRAVENOUS

## 2014-09-05 MED ORDER — DIAZEPAM 5 MG PO TABS
10.0000 mg | ORAL_TABLET | ORAL | Status: AC
  Administered 2014-09-05: 10 mg via ORAL
  Filled 2014-09-05: qty 2

## 2014-09-05 MED ORDER — CIPROFLOXACIN HCL 500 MG PO TABS
500.0000 mg | ORAL_TABLET | ORAL | Status: AC
  Administered 2014-09-05: 500 mg via ORAL
  Filled 2014-09-05: qty 1

## 2014-09-05 MED ORDER — DIPHENHYDRAMINE HCL 25 MG PO CAPS
25.0000 mg | ORAL_CAPSULE | ORAL | Status: AC
  Administered 2014-09-05: 25 mg via ORAL
  Filled 2014-09-05: qty 1

## 2014-09-05 NOTE — Op Note (Signed)
Please see scanned notes for [facility] operative note.

## 2014-09-05 NOTE — Interval H&P Note (Signed)
History and Physical Interval Note:  09/05/2014 10:24 AM  Steven Torres  has presented today for surgery, with the diagnosis of left ureteral stone  The various methods of treatment have been discussed with the patient and family. After consideration of risks, benefits and other options for treatment, the patient has consented to  Procedure(s): LEFT EXTRACORPOREAL SHOCK WAVE LITHOTRIPSY (ESWL) (Left) as a surgical intervention .  The patient's history has been reviewed, patient examined, no change in status, stable for surgery.  I have reviewed the patient's chart and labs.  Questions were answered to the patient's satisfaction.     Alonzo Owczarzak,LES

## 2014-09-05 NOTE — Discharge Instructions (Signed)
1. You should strain your urine and collect all fragments and bring them to your follow up appointment.  2. You should take your pain medication as needed.  Please call if your pain is severe to the point that it is not controlled with your pain medication. 3. You should call if you develop fever > 101 or persistent nausea or vomiting. 4. Your doctor may prescribe tamsulosin to take to help facilitate stone passage.

## 2015-03-31 ENCOUNTER — Encounter: Payer: Self-pay | Admitting: Family Medicine

## 2015-03-31 ENCOUNTER — Ambulatory Visit (INDEPENDENT_AMBULATORY_CARE_PROVIDER_SITE_OTHER): Payer: BLUE CROSS/BLUE SHIELD | Admitting: Family Medicine

## 2015-03-31 VITALS — BP 137/78 | HR 66 | Ht 73.5 in | Wt 200.0 lb

## 2015-03-31 DIAGNOSIS — Z0000 Encounter for general adult medical examination without abnormal findings: Secondary | ICD-10-CM

## 2015-03-31 DIAGNOSIS — R7301 Impaired fasting glucose: Secondary | ICD-10-CM

## 2015-03-31 DIAGNOSIS — IMO0001 Reserved for inherently not codable concepts without codable children: Secondary | ICD-10-CM

## 2015-03-31 DIAGNOSIS — E785 Hyperlipidemia, unspecified: Secondary | ICD-10-CM

## 2015-03-31 NOTE — Patient Instructions (Signed)
Thank you for coming in today. Get fasting labs.  Return in 1 year or sooner if needed.  Call or go to the emergency room if you get worse, have trouble breathing, have chest pains, or palpitations.   Lipid Profile Test WHY AM I HAVING THIS TEST? The lipid profile test gives results that can help predict the likelihood of developing heart disease. The test is also used to monitor treatment for high cholesterol to see if you are reaching your goals. A lipid profile measures the following:  Total cholesterol. Cholesterol is a waxy fat in your blood. If your total cholesterol is elevated, this can increase your risk of coronary heart disease.  High-density lipoprotein (HDL). This is known as the good cholesterol. Having a high level of HDL is good. Your HDL level may be low if you smoke or do not get enough exercise.  Low-density lipoprotein (LDL). This is known as the bad cholesterol and is responsible for the formation of plaque in the arteries. Having a low level of LDL is best.  Cholesterol to HDL ratio. This is calculated by dividing the total cholesterol by the HDL cholesterol. The ratio is used by health care providers for determining your risk of heart disease. A low ratio is best.  Triglycerides. These are a type of fat in the blood responsible for providing energy to your cells. Low levels are best. WHAT KIND OF SAMPLE IS TAKEN? A blood sample is required for this test. It is usually collected by inserting a needle into a vein. HOW DO I PREPARE FOR THE TEST?  Do not eat or drink anything after midnight on the night before the test or as directed by your health care provider. WHAT ARE THE REFERENCE RANGES? Reference ranges are considered healthy ranges established after testing a large group of healthy people. Reference ranges may vary among different people, labs, and hospitals. It is your responsibility to obtain your test results. Ask the lab or department performing the test when and  how you will get your results. Reference ranges for the lipid profile test are as follows: Total Cholesterol  Adult or elderly: less than 200 mg/dL or less than 5.20 mmol/L (SI units).  Child: 120-200 mg/dL.  Infant: 70-175 mg/dL.  Newborns: 53-135 mg/dL. HDL  Male: greater than 45 mg/dL or greater than 0.75 mmol/L (SI units).  Male: greater than 55 mg/dL or greater than 0.91 mmol/L (SI units). HDL reference values based on risk of heart disease:  For low risk of heart disease:  Male: 60 mg/dL or 1.55 mmol/L.  Male: 70 mg/dL or 1.81 mmol/L.  For moderate risk of heart disease:  Male: 45 mg/dL or 1.17 mmol/L.  Male: 55 mg/dL or 1.42 mmol/L.  For high risk of heart disease:  Male: 25 mg/dL or 0.65 mmol/L.  Male: 35 mg/dL or 0.90 mmol/L. LDL  Adult: less than 130 mg/dL.  Children: less than 110 mg/dL. Cholesterol to HDL Ratio Reference values based on risk for coronary heart disease:  Risk that is one half average:  Male: 3.4.  Male: 3.3.  Average risk:  Male: 5.0.  Male: 4.4.  Risk that is two times average (moderate risk):  Male: 10.0.  Male: 7.0.  Risk that is three times average (high risk):  Male: 24.0.  Male: 11.0. Triglycerides  Adult or elderly:  Male: 40-160 mg/dL or 0.45-1.81 mmol/L (SI units).  Male: 35-135 mg/dL or 0.40-1.52 mmol/L (SI units).  Children 45-19 years old:  Male: 30-86 mg/dL.  Male:  32-99 mg/dL.  Children 71-41 years old:  Male: 31-108 mg/dL.  Male: 35-114 mg/dL.  Children 63-68 years old:  Male: 36-138 mg/dL.  Male: 41-138 mg/dL.  Children 76-3 years old:  Male: 40-163 mg/dL.  Male: 40-128 mg/dL. Triglycerides should be less than 400 mg/dL even when you are not fasting.  WHAT DO THE RESULTS MEAN?  Talk with your health care provider to discuss your results, treatment options, and if necessary, the need for more tests. Talk with your health care provider if you have any  questions about your results.   This information is not intended to replace advice given to you by your health care provider. Make sure you discuss any questions you have with your health care provider.   Document Released: 05/13/2004 Document Revised: 05/10/2014 Document Reviewed: 08/09/2013 Elsevier Interactive Patient Education [employer].

## 2015-03-31 NOTE — Assessment & Plan Note (Signed)
Doing very well. Patient declines vaccines as noted above. Check vitamin D, TSH, lipid panel, HIV, hepatitis C, CMP, and CBC today. Discussed likelihood of starting statins with lipid panel. Return in one year one month depending on lipid panel results.

## 2015-03-31 NOTE — Progress Notes (Signed)
Steven Torres is a 54 y.o. male who presents to [facility]  today for establish care perform wellness visit.  Patient feels well with no acute medical issues. He has a history of left-sided colitis several years ago with no firm diagnosis that resolved spontaneously. He does not take any medications and is asymptomatic. He denies any chest pains palpitations shortness of breath fevers or chills. He takes medications and supplements listed below. He denies any drug allergies. His last colonoscopy was 9 years ago. He receives prostate care via [facility]. He declines Tdap and influenza vaccines today.   Past Medical History  Diagnosis Date  . GERD (gastroesophageal reflux disease)   . Hyperlipidemia   . Ulcerative colitis ([place])   . Chronic kidney disease   . Renal stone    Past Surgical History  Procedure Laterality Date  . Back surgery    . Hernia repair    . Knee arthroscopy      right  . Wisdom tooth extraction     Social History  Substance Use Topics  . Smoking status: Never Smoker   . Smokeless tobacco: Never Used  . Alcohol Use: No   family history includes Colon polyps in his mother; Prostate cancer in his father. There is no history of Colon cancer, Rectal cancer, or Stomach cancer.  ROS as above Medications: Current Outpatient Prescriptions  Medication Sig Dispense Refill  . acetaminophen (TYLENOL) 500 MG tablet Take 500 mg by mouth every 6 (six) hours as needed for moderate pain or headache.    . Cholecalciferol (VITAMIN D) 2000 UNITS CAPS Take 1 capsule by mouth daily.    . fish oil-omega-3 fatty acids 1000 MG capsule Take 3,000 g by mouth daily with lunch.     . GuaiFENesin (MUCUS RELIEF ADULT PO) Take by mouth.    . Misc Natural Products (JOINT SUPPORT PO) Take by mouth.    . Misc Natural Products (PROSTATE SUPPORT PO) Take by mouth.    . Multiple Vitamin (MULTIVITAMIN) capsule Take 1 capsule by mouth 2 (two) times daily.        Current Facility-Administered Medications  Medication Dose Route Frequency Provider Last Rate Last Dose  . 0.9 %  sodium chloride infusion  500 mL Intravenous Continuous Irene Shipper, MD       No Known Allergies   Exam:  BP 137/78 mmHg  Pulse 66  Ht 6' 1.5" (1.867 m)  Wt 200 lb (90.719 kg)  BMI 26.03 kg/m2 Gen: Well NAD HEENT: EOMI,  MMM Lungs: Normal work of breathing. CTABL Heart: RRR no MRG Abd: NABS, Soft. Nondistended, Nontender Exts: Brisk capillary refill, warm and well perfused.   No results found for this or any previous visit (from the past 24 hour(s)). No results found.   Please see individual assessment and plan sections.

## 2015-04-11 LAB — CBC
HEMATOCRIT: 44.4 % (ref 39.0–52.0)
HEMOGLOBIN: 15.2 g/dL (ref 13.0–17.0)
MCH: 31.1 pg (ref 26.0–34.0)
MCHC: 34.2 g/dL (ref 30.0–36.0)
MCV: 91 fL (ref 78.0–100.0)
MPV: 9.4 fL (ref 8.6–12.4)
Platelets: 356 10*3/uL (ref 150–400)
RBC: 4.88 MIL/uL (ref 4.22–5.81)
RDW: 12.7 % (ref 11.5–15.5)
WBC: 6.1 10*3/uL (ref 4.0–10.5)

## 2015-04-12 LAB — HIV ANTIBODY (ROUTINE TESTING W REFLEX): HIV: NONREACTIVE

## 2015-04-12 LAB — COMPREHENSIVE METABOLIC PANEL
ALBUMIN: 4.6 g/dL (ref 3.6–5.1)
ALT: 26 U/L (ref 9–46)
AST: 21 U/L (ref 10–35)
Alkaline Phosphatase: 76 U/L (ref 40–115)
BUN: 20 mg/dL (ref 7–25)
CALCIUM: 9.9 mg/dL (ref 8.6–10.3)
CHLORIDE: 103 mmol/L (ref 98–110)
CO2: 29 mmol/L (ref 20–31)
CREATININE: 1.15 mg/dL (ref 0.70–1.33)
Glucose, Bld: 103 mg/dL — ABNORMAL HIGH (ref 65–99)
POTASSIUM: 5 mmol/L (ref 3.5–5.3)
SODIUM: 141 mmol/L (ref 135–146)
TOTAL PROTEIN: 7.3 g/dL (ref 6.1–8.1)
Total Bilirubin: 0.7 mg/dL (ref 0.2–1.2)

## 2015-04-12 LAB — LIPID PANEL
CHOLESTEROL: 213 mg/dL — AB (ref 125–200)
HDL: 58 mg/dL (ref >=40)
LDL Cholesterol: 140 mg/dL — ABNORMAL HIGH (ref <130)
Total CHOL/HDL Ratio: 3.7 Ratio (ref <=5.0)
Triglycerides: 73 mg/dL (ref <150)
VLDL: 15 mg/dL (ref <30)

## 2015-04-12 LAB — HEPATITIS C ANTIBODY: HCV AB: NEGATIVE

## 2015-04-12 LAB — TSH: TSH: 1.958 u[IU]/mL (ref 0.350–4.500)

## 2015-04-12 LAB — VITAMIN D 25 HYDROXY (VIT D DEFICIENCY, FRACTURES): Vit D, 25-Hydroxy: 71 ng/mL (ref 30–100)

## 2015-04-13 DIAGNOSIS — E785 Hyperlipidemia, unspecified: Secondary | ICD-10-CM | POA: Insufficient documentation

## 2015-04-13 DIAGNOSIS — R7301 Impaired fasting glucose: Secondary | ICD-10-CM | POA: Insufficient documentation

## 2015-04-13 MED ORDER — ATORVASTATIN CALCIUM 20 MG PO TABS: 20.0000 mg | ORAL_TABLET | Freq: Every day | ORAL | Status: DC

## 2015-04-13 NOTE — Progress Notes (Signed)
Quick Note:  Cholesterol is high. Plan to start lipitor.  Fasting blood sugar is slightly high.  Plan to recheck long term blood sugar control at the next check,  Return in 2 month fasting. ______

## 2015-04-13 NOTE — Addendum Note (Signed)
Addended by: Gregor Hams on: 04/13/2015 04:26 PM   Modules accepted: Orders

## 2015-09-09 ENCOUNTER — Telehealth: Payer: Self-pay | Admitting: Family Medicine

## 2015-09-09 NOTE — Telephone Encounter (Signed)
Pt would like to trans care to Dr Birdie Riddle, please advise ok to schedule.

## 2015-09-09 NOTE — Telephone Encounter (Signed)
Ok to establish 

## 2015-09-10 NOTE — Telephone Encounter (Signed)
Pt has been scheduled.  °

## 2015-09-15 ENCOUNTER — Ambulatory Visit (INDEPENDENT_AMBULATORY_CARE_PROVIDER_SITE_OTHER): Payer: 59 | Admitting: Family Medicine

## 2015-09-15 ENCOUNTER — Encounter: Payer: Self-pay | Admitting: Family Medicine

## 2015-09-15 VITALS — BP 122/76 | HR 76 | Temp 98.0°F | Resp 16 | Ht 74.0 in | Wt 195.0 lb

## 2015-09-15 DIAGNOSIS — J302 Other seasonal allergic rhinitis: Secondary | ICD-10-CM

## 2015-09-15 DIAGNOSIS — R7301 Impaired fasting glucose: Secondary | ICD-10-CM | POA: Diagnosis not present

## 2015-09-15 DIAGNOSIS — E785 Hyperlipidemia, unspecified: Secondary | ICD-10-CM

## 2015-09-15 NOTE — Progress Notes (Signed)
Pre visit review using our clinic review tool, if applicable. No additional management support is needed unless otherwise documented below in the visit note. 

## 2015-09-15 NOTE — Progress Notes (Signed)
   Subjective:    Patient ID: Steven Torres, male    DOB: 1960/10/16, 55 y.o.   MRN: [id number]  HPI New to establish.  Previous MD- Georgina Snell x1 visit and Dr Greta Doom prior to that  Hyperlipidemia- pt's total cholesterol was 213 and LDL was 140 in December.  Pt prefers to work on healthy diet and regular exercise rather than start Lipitor (which was recommended).  Pt has lost 6 lbs since labs were done.  No CP, SOB, HAs, visual changes, abd pain, N/V.  Elevated glucose- pt's fasting glucose was 103 in December.  Pt has been working on [employer] and regular exercise since this reading.  Denies feeling shaky or dizzy.  Allergies- pt is struggling w/ seasonal allergies.  Has tried 'everything' and is currently using Afrin.     Review of Systems For ROS see HPI     Objective:   Physical Exam  Constitutional: He is oriented to person, place, and time. He appears well-developed and well-nourished. No distress.  HENT:  Head: Normocephalic and atraumatic.  No TTP over sinuses + turbinate edema + PND TMs normal bilaterally  Eyes: Conjunctivae and EOM are normal. Pupils are equal, round, and reactive to light.  Neck: Normal range of motion. Neck supple.  Cardiovascular: Normal rate, regular rhythm and normal heart sounds.   Pulmonary/Chest: Effort normal and breath sounds normal. No respiratory distress. He has no wheezes.  Lymphadenopathy:    He has no cervical adenopathy.  Neurological: He is alert and oriented to person, place, and time.  Skin: Skin is warm and dry.  Psychiatric: He has a normal mood and affect. His behavior is normal. Thought content normal.  Vitals reviewed.         Assessment & Plan:

## 2015-09-15 NOTE — Patient Instructions (Signed)
Schedule a lab visit this week at your convenience- please arrive fasting!! Continue to work on healthy diet and regular exercise- you look great! Start daily OTC Zyrtec (Cetirizine) and Flonase in combination for the seasonal allergies Drink plenty of fluids Call with any questions or concerns Welcome!  We're glad to have you! [address]!

## 2015-09-16 NOTE — Assessment & Plan Note (Signed)
New to provider.  Pt's last glucose was 103.  He has worked on [employer] and getting more exercise.  He is not fasting today so I will have him return to get accurate fasting labs.  Pt expressed understanding and is in agreement w/ plan.

## 2015-09-16 NOTE — Assessment & Plan Note (Signed)
New to provider.  Pt's lipids were only mildly elevated in December.  No need to start statin at this time- pt has been working on diet and exercise.  Will repeat labs and determine if additional measures are required.  Pt expressed understanding and is in agreement w/ plan.

## 2015-09-16 NOTE — Assessment & Plan Note (Signed)
New to provider, ongoing for pt.  Suggested he stop Afrin and switch to Zyrtec and Flonase combo.  Pt expressed understanding and is in agreement w/ plan.

## 2015-09-17 ENCOUNTER — Other Ambulatory Visit (INDEPENDENT_AMBULATORY_CARE_PROVIDER_SITE_OTHER): Payer: 59

## 2015-09-17 DIAGNOSIS — E785 Hyperlipidemia, unspecified: Secondary | ICD-10-CM

## 2015-09-17 DIAGNOSIS — R7301 Impaired fasting glucose: Secondary | ICD-10-CM | POA: Diagnosis not present

## 2015-09-17 LAB — BASIC METABOLIC PANEL
BUN: 17 mg/dL (ref 6–23)
CALCIUM: 9.7 mg/dL (ref 8.4–10.5)
CO2: 28 mEq/L (ref 19–32)
CREATININE: 1.09 mg/dL (ref 0.40–1.50)
Chloride: 106 mEq/L (ref 96–112)
GFR: 74.7 mL/min (ref >60.00)
Glucose, Bld: 82 mg/dL (ref 70–99)
Potassium: 4.2 mEq/L (ref 3.5–5.1)
Sodium: 141 mEq/L (ref 135–145)

## 2015-09-17 LAB — LIPID PANEL
CHOL/HDL RATIO: 4
CHOLESTEROL: 200 mg/dL (ref 0–200)
HDL: 46.4 mg/dL (ref >39.00)
LDL CALC: 135 mg/dL — AB (ref 0–99)
NonHDL: 153.11
TRIGLYCERIDES: 89 mg/dL (ref 0.0–149.0)
VLDL: 17.8 mg/dL (ref 0.0–40.0)

## 2015-09-17 LAB — CBC WITH DIFFERENTIAL/PLATELET
BASOS PCT: 0.5 % (ref 0.0–3.0)
Basophils Absolute: 0 10*3/uL (ref 0.0–0.1)
Eosinophils Absolute: 0.2 10*3/uL (ref 0.0–0.7)
Eosinophils Relative: 2.7 % (ref 0.0–5.0)
HEMATOCRIT: 43.3 % (ref 39.0–52.0)
Hemoglobin: 14.9 g/dL (ref 13.0–17.0)
LYMPHS ABS: 1.8 10*3/uL (ref 0.7–4.0)
LYMPHS PCT: 25.2 % (ref 12.0–46.0)
MCHC: 34.4 g/dL (ref 30.0–36.0)
MCV: 92.6 fl (ref 78.0–100.0)
MONOS PCT: 7.3 % (ref 3.0–12.0)
Monocytes Absolute: 0.5 10*3/uL (ref 0.1–1.0)
NEUTROS PCT: 64.3 % (ref 43.0–77.0)
Neutro Abs: 4.5 10*3/uL (ref 1.4–7.7)
Platelets: 346 10*3/uL (ref 150.0–400.0)
RBC: 4.68 Mil/uL (ref 4.22–5.81)
RDW: 12.8 % (ref 11.5–15.5)
WBC: 7 10*3/uL (ref 4.0–10.5)

## 2015-09-17 LAB — HEPATIC FUNCTION PANEL
ALT: 20 U/L (ref 0–53)
AST: 19 U/L (ref 0–37)
Albumin: 4.7 g/dL (ref 3.5–5.2)
Alkaline Phosphatase: 74 U/L (ref 39–117)
BILIRUBIN DIRECT: 0.1 mg/dL (ref 0.0–0.3)
BILIRUBIN TOTAL: 0.9 mg/dL (ref 0.2–1.2)
Total Protein: 7.4 g/dL (ref 6.0–8.3)

## 2015-12-11 ENCOUNTER — Ambulatory Visit: Payer: BLUE CROSS/BLUE SHIELD | Admitting: Family Medicine

## 2015-12-29 DIAGNOSIS — Z125 Encounter for screening for malignant neoplasm of prostate: Secondary | ICD-10-CM | POA: Diagnosis not present

## 2016-01-06 DIAGNOSIS — N200 Calculus of kidney: Secondary | ICD-10-CM | POA: Diagnosis not present

## 2016-03-02 DIAGNOSIS — H5213 Myopia, bilateral: Secondary | ICD-10-CM | POA: Diagnosis not present

## 2016-03-02 DIAGNOSIS — H02401 Unspecified ptosis of right eyelid: Secondary | ICD-10-CM | POA: Diagnosis not present

## 2016-04-07 ENCOUNTER — Encounter: Payer: Self-pay | Admitting: Gastroenterology

## 2016-04-14 ENCOUNTER — Encounter: Payer: Self-pay | Admitting: General Practice

## 2016-04-14 ENCOUNTER — Ambulatory Visit (INDEPENDENT_AMBULATORY_CARE_PROVIDER_SITE_OTHER): Payer: 59 | Admitting: Family Medicine

## 2016-04-14 ENCOUNTER — Encounter: Payer: Self-pay | Admitting: Family Medicine

## 2016-04-14 DIAGNOSIS — F411 Generalized anxiety disorder: Secondary | ICD-10-CM

## 2016-04-14 MED ORDER — ALPRAZOLAM 0.5 MG PO TABS: 0.5000 mg | ORAL_TABLET | Freq: Two times a day (BID) | ORAL | 3 refills | Status: DC | PRN

## 2016-04-14 NOTE — Assessment & Plan Note (Signed)
New to provider, recurrent problem for pt.  He has a prescription from 2015 and is just now out of medication.  He is not interested in using this regularly and declines a daily SSRI.  Reviewed risks of these medications- including sedation, addiction, respiratory depression (especially when mixed w/ ETOH), and even death.  Pt aware and plans to use sparingly.

## 2016-04-14 NOTE — Progress Notes (Signed)
   Subjective:    Patient ID: Steven Torres, male    DOB: 14-Feb-1961, 55 y.o.   MRN: [id number]  HPI Anxiety- 'i need some xanax for stress'.  Pt reports increased stress at work.  Has had xanax in the past.  sxs typically start as indigestion.  In the past has had 'severe HAs'.  If sxs get severe enough he will have flares of UC.  Pt reports work situation has changed which is more stressful.  Stressful days are 'more days than not'.  Pt's previous MD told him that 'any one over 14 should be on Xanax'.  Pt usually works out to manage stress but has not been able to recently.   Review of Systems For ROS see HPI     Objective:   Physical Exam  Constitutional: He is oriented to person, place, and time. He appears well-developed and well-nourished. No distress.  HENT:  Head: Normocephalic and atraumatic.  Neurological: He is alert and oriented to person, place, and time.  Psychiatric: He has a normal mood and affect. His behavior is normal. Thought content normal.  Vitals reviewed.         Assessment & Plan:

## 2016-04-14 NOTE — Progress Notes (Signed)
Pre visit review using our clinic review tool, if applicable. No additional management support is needed unless otherwise documented below in the visit note. 

## 2016-04-14 NOTE — Patient Instructions (Addendum)
Schedule your complete physical at your convenience Start the Alprazolam as needed for those panicked moments Make sure you have a stress outlet- you deserve it!! Call with any questions or concerns Hang in there! Happy Holidays!!!

## 2016-04-16 MED FILL — ALPRAZolam 0.5 MG TABS: 0.5 | 30 days supply | Qty: 60 | Fill #0

## 2016-05-13 ENCOUNTER — Ambulatory Visit (AMBULATORY_SURGERY_CENTER): Payer: Self-pay | Admitting: *Deleted

## 2016-05-13 VITALS — Ht 73.5 in | Wt 204.6 lb

## 2016-05-13 DIAGNOSIS — Z1211 Encounter for screening for malignant neoplasm of colon: Secondary | ICD-10-CM

## 2016-05-13 MED ORDER — NA SULFATE-K SULFATE-MG SULF 17.5-3.13-1.6 GM/177ML PO SOLN: 1.0000 | Freq: Once | ORAL | 0 refills | Status: AC

## 2016-05-13 MED FILL — SUPREP BOWEL PREP KIT: 17.5-3.13-1 | 1 days supply | Qty: 354 | Fill #0

## 2016-05-13 NOTE — Progress Notes (Signed)
Denies allergies to eggs or soy products. Denies complications with sedation or anesthesia. Denies O2 use. Denies use of diet or weight loss medications.  Emmi instructions given for colonoscopy.  

## 2016-05-19 ENCOUNTER — Encounter: Payer: Self-pay | Admitting: Gastroenterology

## 2016-06-02 ENCOUNTER — Encounter: Payer: Self-pay | Admitting: Gastroenterology

## 2016-06-02 ENCOUNTER — Ambulatory Visit (AMBULATORY_SURGERY_CENTER): Payer: 59 | Admitting: Gastroenterology

## 2016-06-02 VITALS — BP 105/67 | HR 67 | Temp 99.8°F | Resp 14 | Ht 73.5 in | Wt 204.0 lb

## 2016-06-02 DIAGNOSIS — K529 Noninfective gastroenteritis and colitis, unspecified: Secondary | ICD-10-CM | POA: Diagnosis not present

## 2016-06-02 DIAGNOSIS — Z1211 Encounter for screening for malignant neoplasm of colon: Secondary | ICD-10-CM | POA: Insufficient documentation

## 2016-06-02 DIAGNOSIS — K633 Ulcer of intestine: Secondary | ICD-10-CM | POA: Diagnosis not present

## 2016-06-02 DIAGNOSIS — D123 Benign neoplasm of transverse colon: Secondary | ICD-10-CM

## 2016-06-02 DIAGNOSIS — Z1212 Encounter for screening for malignant neoplasm of rectum: Secondary | ICD-10-CM | POA: Diagnosis not present

## 2016-06-02 DIAGNOSIS — K51811 Other ulcerative colitis with rectal bleeding: Secondary | ICD-10-CM

## 2016-06-02 DIAGNOSIS — K629 Disease of anus and rectum, unspecified: Secondary | ICD-10-CM | POA: Diagnosis not present

## 2016-06-02 DIAGNOSIS — E669 Obesity, unspecified: Secondary | ICD-10-CM | POA: Diagnosis not present

## 2016-06-02 DIAGNOSIS — K5090 Crohn's disease, unspecified, without complications: Secondary | ICD-10-CM | POA: Diagnosis not present

## 2016-06-02 DIAGNOSIS — F419 Anxiety disorder, unspecified: Secondary | ICD-10-CM | POA: Diagnosis not present

## 2016-06-02 DIAGNOSIS — K5000 Crohn's disease of small intestine without complications: Secondary | ICD-10-CM | POA: Diagnosis not present

## 2016-06-02 MED ORDER — MESALAMINE 1000 MG RE SUPP: 1000.0000 mg | RECTAL | 3 refills | Status: DC | PRN

## 2016-06-02 MED ORDER — MESALAMINE 1.2 G PO TBEC: 2.4000 g | DELAYED_RELEASE_TABLET | Freq: Every day | ORAL | 3 refills | Status: DC

## 2016-06-02 MED ORDER — SODIUM CHLORIDE 0.9 % IV SOLN: 500.0000 mL | INTRAVENOUS | Status: DC

## 2016-06-02 MED FILL — LIALDA 1.2 GM TABLET SA: 1.2 | 90 days supply | Qty: 180 | Fill #0

## 2016-06-02 NOTE — Op Note (Signed)
[place] Patient Name: Steven Torres Procedure Date: 06/02/2016 7:43 AM MRN: [id number] Endoscopist: Remo Lipps P. Nivek Powley MD, MD Age: 56 Referring MD:  Date of Birth: [birth year] Gender: Male Account #: [id number] Procedure:                Colonoscopy Indications:              High risk colon cancer surveillance: Ulcerative                            left sided colitis, Incidental - Hematochezia Medicines:                Monitored Anesthesia Care Procedure:                Pre-Anesthesia Assessment:                           - Prior to the procedure, a History and Physical                            was performed, and patient medications and                            allergies were reviewed. The patient's tolerance of                            previous anesthesia was also reviewed. The risks                            and benefits of the procedure and the sedation                            options and risks were discussed with the patient.                            All questions were answered, and informed consent                            was obtained. Prior Anticoagulants: The patient has                            taken no previous anticoagulant or antiplatelet                            agents. ASA Grade Assessment: II - A patient with                            mild systemic disease. After reviewing the risks                            and benefits, the patient was deemed in                            satisfactory condition to undergo the procedure.  After obtaining informed consent, the colonoscope                            was passed under direct vision. Throughout the                            procedure, the patient's blood pressure, pulse, and                            oxygen saturations were monitored continuously. The                            Model CF-HQ190L [id number]) scope was introduced                            through the  anus and advanced to the the terminal                            ileum, with identification of the appendiceal                            orifice and IC valve. The colonoscopy was                            technically difficult and complex due to                            significant looping. The patient tolerated the                            procedure well. The quality of the bowel                            preparation was good. The terminal ileum, ileocecal                            valve, appendiceal orifice, and rectum were                            photographed. Scope In: 7:56:31 AM Scope Out: 8:20:51 AM Scope Withdrawal Time: 0 hours 15 minutes 9 seconds  Total Procedure Duration: 0 hours 24 minutes 20 seconds  Findings:                 The perianal and digital rectal examinations were                            normal.                           The terminal ileum contained a single (solitary)                            ulcer. Biopsies were taken with a cold forceps for  histology.                           Diffuse mild to moderate inflammation characterized                            by congestion (edema), erythema, friability,                            granularity and loss of vascularity was found in                            the rectum, in the recto-sigmoid colon and in the                            cecum. Cecal inflammation is localized to the AO                            (cecal patch). Biopsies were taken with a cold                            forceps for histology.                           A 5 mm polyp was found in the transverse colon. The                            polyp was sessile. The polyp was removed with a                            cold snare. Resection and retrieval were complete.                           A few medium-mouthed diverticula were found in the                            left colon.                            Internal hemorrhoids were found during retroflexion.                           The exam was otherwise without abnormality. Complications:            No immediate complications. Estimated blood loss:                            Minimal. Estimated Blood Loss:     Estimated blood loss was minimal. Impression:               - A single (solitary) ulcer in the terminal ileum.                            Biopsied.                           -  Diffuse inflammation was found in the rectum, in                            the recto-sigmoid colon with cecal patch. Biopsied.                           - One 5 mm polyp in the transverse colon, removed                            with a cold snare. Resected and retrieved.                           - Diverticulosis in the left colon.                           - Internal hemorrhoids.                           - The examination was otherwise normal. Recommendation:           - Patient has a contact number available for                            emergencies. The signs and symptoms of potential                            delayed complications were discussed with the                            patient. Return to normal activities tomorrow.                            Written discharge instructions were provided to the                            patient.                           - Resume previous diet.                           - Continue present medications.                           - No ibuprofen, naproxen, or other non-steroidal                            anti-inflammatory drugs.                           - Start Lialda 2.4gm / day                           - Start Canasa suppositories PRN                           - Await pathology  results.                           - Repeat colonoscopy is recommended for                            surveillance. The colonoscopy date will be                            determined after pathology results from today's                             exam become available for review. Remo Lipps P. Harvest Stanco MD, MD 06/02/2016 8:34:14 AM This report has been signed electronically.

## 2016-06-02 NOTE — Progress Notes (Signed)
Pt's states no medical or surgical changes since previsit or office visit. 

## 2016-06-02 NOTE — Patient Instructions (Signed)
YOU HAD AN ENDOSCOPIC PROCEDURE TODAY AT [facility]:   Refer to the procedure report that was given to you for any specific questions about what was found during the examination.  If the procedure report does not answer your questions, please call your gastroenterologist to clarify.  If you requested that your care partner not be given the details of your procedure findings, then the procedure report has been included in a sealed envelope for you to review at your convenience later.  YOU SHOULD EXPECT: Some feelings of bloating in the abdomen. Passage of more gas than usual.  Walking can help get rid of the air that was put into your GI tract during the procedure and reduce the bloating. If you had a lower endoscopy (such as a colonoscopy or flexible sigmoidoscopy) you may notice spotting of blood in your stool or on the toilet paper. If you underwent a bowel prep for your procedure, you may not have a normal bowel movement for a few days.  Please Note:  You might notice some irritation and congestion in your nose or some drainage.  This is from the oxygen used during your procedure.  There is no need for concern and it should clear up in a day or so.  SYMPTOMS TO REPORT IMMEDIATELY:   Following lower endoscopy (colonoscopy or flexible sigmoidoscopy):  Excessive amounts of blood in the stool  Significant tenderness or worsening of abdominal pains  Swelling of the abdomen that is new, acute  Fever of 100F or higher   For urgent or emergent issues, a gastroenterologist can be reached at any hour by calling [phone].   DIET:  We do recommend a small meal at first, but then you may proceed to your regular diet.  Drink plenty of fluids but you should avoid alcoholic beverages for 24 hours.  ACTIVITY:  You should plan to take it easy for the rest of today and you should NOT DRIVE or use heavy machinery until tomorrow (because of the sedation medicines used during the test).     FOLLOW UP: Our staff will call the number listed on your records the next business day following your procedure to check on you and address any questions or concerns that you may have regarding the information given to you following your procedure. If we do not reach you, we will leave a message.  However, if you are feeling well and you are not experiencing any problems, there is no need to return our call.  We will assume that you have returned to your regular daily activities without incident.  If any biopsies were taken you will be contacted by phone or by letter within the next 1-3 weeks.  Please call us at [phone] if you have not heard about the biopsies in 3 weeks.    SIGNATURES/CONFIDENTIALITY: You and/or your care partner have signed paperwork which will be entered into your electronic medical record.  These signatures attest to the fact that that the information above on your After Visit Summary has been reviewed and is understood.  Full responsibility of the confidentiality of this discharge information lies with you and/or your care-partner.  Polyp, diverticulosis, hemorrhoid information given.  Ulcerative colitis information given.  No ibuprofen, naproxen, or other non-steroidal anti-inflammatory medications.  Start Lialda 2.4 gm daily.  Start Canasa suppositories as needed.

## 2016-06-02 NOTE — Progress Notes (Signed)
Per Dr. Havery Moros change pt's dx from screening colon z12.11 to ulcerative colitis with rectal bleeding k51.811.  Dx changed by MAW.  Corky Sing

## 2016-06-02 NOTE — Progress Notes (Signed)
Report to PACU, RN, vss, BBS= Clear.  

## 2016-06-02 NOTE — Progress Notes (Signed)
Called to room to assist during endoscopic procedure.  Patient ID and intended procedure confirmed with present staff. Received instructions for my participation in the procedure from the performing physician.  

## 2016-06-03 ENCOUNTER — Telehealth: Payer: Self-pay | Admitting: *Deleted

## 2016-06-03 ENCOUNTER — Telehealth: Payer: Self-pay | Admitting: Gastroenterology

## 2016-06-03 NOTE — Telephone Encounter (Signed)
  Follow up Call-  Call back number 06/02/2016  Post procedure Call Back phone  # [phone]  Permission to leave phone message Yes  Some recent data might be hidden     Patient questions:  Do you have a fever, pain , or abdominal swelling? No. Pain Score  0 *  Have you tolerated food without any problems? Yes.    Have you been able to return to your normal activities? Yes.    Do you have any questions about your discharge instructions: Diet   No. Medications  No. Follow up visit  No.  Do you have questions or concerns about your Care? No.  Actions: * If pain score is 4 or above: No action needed, pain <4.  Stated I did fine.

## 2016-06-03 NOTE — Telephone Encounter (Signed)
Called patient to let him know that I have a couple sample boxes of Canasa rectal suppositories. Let him know directions, to use as needed. Samples left at our front desk, his wife will come by to pick these up.

## 2016-06-03 NOTE — Telephone Encounter (Signed)
I got a call from [place] about this patient this morning. Yesterday we did a colonoscopy and he had left sided UC, mostly proctitis. I had ordered Canasa in addition to [employer] but there is a Oceanographer apparently and not stocked in the pharmacy.  Can you check to see if we have any Canasa samples we can give him? If not, we can order him Rowasa enemas to use PRN. Thanks much

## 2016-07-13 ENCOUNTER — Telehealth: Payer: 59 | Admitting: Family

## 2016-07-13 DIAGNOSIS — J019 Acute sinusitis, unspecified: Secondary | ICD-10-CM | POA: Diagnosis not present

## 2016-07-13 DIAGNOSIS — B9689 Other specified bacterial agents as the cause of diseases classified elsewhere: Secondary | ICD-10-CM

## 2016-07-13 MED ORDER — AMOXICILLIN-POT CLAVULANATE 875-125 MG PO TABS: 1.0000 | ORAL_TABLET | Freq: Two times a day (BID) | ORAL | 0 refills | Status: DC

## 2016-07-13 MED FILL — AMOX TR-K CLV 875-125 MG TA: 875-125 | 7 days supply | Qty: 14 | Fill #0

## 2016-07-13 NOTE — Progress Notes (Signed)

## 2016-07-27 ENCOUNTER — Other Ambulatory Visit (INDEPENDENT_AMBULATORY_CARE_PROVIDER_SITE_OTHER): Payer: 59

## 2016-07-27 ENCOUNTER — Encounter: Payer: Self-pay | Admitting: Gastroenterology

## 2016-07-27 ENCOUNTER — Ambulatory Visit (INDEPENDENT_AMBULATORY_CARE_PROVIDER_SITE_OTHER): Payer: 59 | Admitting: Gastroenterology

## 2016-07-27 DIAGNOSIS — K51311 Ulcerative (chronic) rectosigmoiditis with rectal bleeding: Secondary | ICD-10-CM

## 2016-07-27 DIAGNOSIS — K51811 Other ulcerative colitis with rectal bleeding: Secondary | ICD-10-CM

## 2016-07-27 LAB — COMPREHENSIVE METABOLIC PANEL
ALT: 33 U/L (ref 0–53)
AST: 22 U/L (ref 0–37)
Albumin: 4.5 g/dL (ref 3.5–5.2)
Alkaline Phosphatase: 81 U/L (ref 39–117)
BUN: 13 mg/dL (ref 6–23)
CHLORIDE: 103 meq/L (ref 96–112)
CO2: 30 meq/L (ref 19–32)
CREATININE: 1.05 mg/dL (ref 0.40–1.50)
Calcium: 9.6 mg/dL (ref 8.4–10.5)
GFR: 77.75 mL/min (ref >60.00)
GLUCOSE: 91 mg/dL (ref 70–99)
Potassium: 4 mEq/L (ref 3.5–5.1)
Sodium: 139 mEq/L (ref 135–145)
Total Bilirubin: 0.6 mg/dL (ref 0.2–1.2)
Total Protein: 7.3 g/dL (ref 6.0–8.3)

## 2016-07-27 LAB — CBC WITH DIFFERENTIAL/PLATELET
BASOS PCT: 0.4 % (ref 0.0–3.0)
Basophils Absolute: 0 10*3/uL (ref 0.0–0.1)
EOS ABS: 0.3 10*3/uL (ref 0.0–0.7)
Eosinophils Relative: 2.8 % (ref 0.0–5.0)
HCT: 42.6 % (ref 39.0–52.0)
Hemoglobin: 14.6 g/dL (ref 13.0–17.0)
Lymphocytes Relative: 24 % (ref 12.0–46.0)
Lymphs Abs: 2.2 10*3/uL (ref 0.7–4.0)
MCHC: 34.3 g/dL (ref 30.0–36.0)
MCV: 91.7 fl (ref 78.0–100.0)
Monocytes Absolute: 0.7 10*3/uL (ref 0.1–1.0)
Monocytes Relative: 7.3 % (ref 3.0–12.0)
NEUTROS ABS: 5.9 10*3/uL (ref 1.4–7.7)
NEUTROS PCT: 65.5 % (ref 43.0–77.0)
PLATELETS: 371 10*3/uL (ref 150.0–400.0)
RBC: 4.64 Mil/uL (ref 4.22–5.81)
RDW: 12.3 % (ref 11.5–15.5)
WBC: 9 10*3/uL (ref 4.0–10.5)

## 2016-07-27 LAB — VITAMIN D 25 HYDROXY (VIT D DEFICIENCY, FRACTURES): VITD: 57.47 ng/mL (ref 30.00–100.00)

## 2016-07-27 LAB — HIGH SENSITIVITY CRP: CRP, High Sensitivity: 0.4 mg/L (ref 0.000–5.000)

## 2016-07-27 MED ORDER — MESALAMINE 1.2 G PO TBEC: 2.4000 g | DELAYED_RELEASE_TABLET | Freq: Every day | ORAL | 3 refills | Status: DC

## 2016-07-27 NOTE — Patient Instructions (Signed)
If you are age 56 or older, your body mass index should be between 23-30. Your Body mass index is 27.31 kg/m. If this is out of the aforementioned range listed, please consider follow up with your Primary Care Provider.  If you are age 9 or younger, your body mass index should be between 19-25. Your Body mass index is 27.31 kg/m. If this is out of the aformentioned range listed, please consider follow up with your Primary Care Provider.    Your physician has requested that you go to the basement for lab work before leaving today.  We have sent the following medications to your pharmacy for you to pick up at your convenience:  Lialda  Please follow up in 1 year with Dr. Havery Moros. You will be contacted by letter when it is time to schedule.  Thank you.

## 2016-07-27 NOTE — Progress Notes (Signed)
HPI :  56 y/o male with a history of left sided ulcerative colitis, here for a follow up visit. Previously followed by Dr. Deatra Ina. I met him for his colonoscopy on January 31st. He was on on any therapy at the time of the procedure and following it we started Lialda 2.4gm / day along with suppositories PRN.   He has a history of left sided colitis diagnosed in 2013-2014 or so. No prior hospitalization. He had been taking Lialda historically, but had been off it since roughly 2015 or so. Prior to the colonoscopy, he was having 2-3 BMs / day, no abdominal pains, but having some bleeding. Since resuming the Lialda he is feeling well. Bleeding has stopped. He is having roughly same stool frequency. No abdominal pains. No urgency  He was taking ibuprofen at the time of the colonoscopy, few times per week.   Colonoscopy 06/02/16 - isolated ileal ulceration, mild to moderate inflammation in the rectum / sigmoid colon, with cecal patch. 44m polyp, diverticulosis. Path with chronic active inflammation. Polyp was adenoma   Past Medical History:  Diagnosis Date  . Anxiety   . Chronic kidney disease   . GERD (gastroesophageal reflux disease)   . Hyperlipidemia   . Renal stone   . Ulcerative colitis ([facility]      Past Surgical History:  Procedure Laterality Date  . BACK SURGERY    . HERNIA REPAIR    . KNEE ARTHROSCOPY     2- both on right knee  . WISDOM TOOTH EXTRACTION     Family History  Problem Relation Age of Onset  . Prostate cancer Father   . Colon polyps Mother     benign  . Heart attack Mother   . Colon cancer Neg Hx   . Rectal cancer Neg Hx   . Stomach cancer Neg Hx    Social History  Substance Use Topics  . Smoking status: Never Smoker  . Smokeless tobacco: Never Used  . Alcohol use No   Current Outpatient Prescriptions  Medication Sig Dispense Refill  . ALPRAZolam (XANAX) 0.5 MG tablet Take 1 tablet (0.5 mg total) by mouth 2 (two) times daily as needed for anxiety. 60  tablet 3  . aspirin EC 81 MG tablet Take 81 mg by mouth daily.    . cetirizine (ZYRTEC) 10 MG tablet Take 10 mg by mouth daily.    . Cholecalciferol (VITAMIN D) 2000 UNITS CAPS Take 1 capsule by mouth daily.    . fish oil-omega-3 fatty acids 1000 MG capsule Take 3,000 g by mouth daily with lunch.     .Marland KitchenMAGNESIUM CARBONATE PO Take by mouth.    . mesalamine (CANASA) 1000 MG suppository Place 1 suppository (1,000 mg total) rectally as needed. 30 suppository 3  . mesalamine (LIALDA) 1.2 g EC tablet Take 2 tablets (2.4 g total) by mouth daily with breakfast. 180 tablet 3  . Misc Natural Products (JOINT SUPPORT PO) Take by mouth.    . Misc Natural Products (PROSTATE SUPPORT PO) Take by mouth.    . Multiple Vitamin (MULTIVITAMIN) capsule Take 1 capsule by mouth 2 (two) times daily.       Current Facility-Administered Medications  Medication Dose Route Frequency Provider Last Rate Last Dose  . 0.9 %  sodium chloride infusion  500 mL Intravenous Continuous SManus Gunning MD       No Known Allergies   Review of Systems: All systems reviewed and negative except where noted in HPI.  Physical Exam: BP 138/78   Pulse 70   Ht _0  (1.854 m)   Wt 207 lb (93.9 kg)   BMI 27.31 kg/m  Constitutional: Pleasant,well-developed, male in no acute distress. HEENT: Normocephalic and atraumatic. Conjunctivae are normal. No scleral icterus. Neck supple.  Cardiovascular: Normal rate, regular rhythm.  Pulmonary/chest: Effort normal and breath sounds normal. No wheezing, rales or rhonchi. Abdominal: Soft, nondistended, nontender. There are no masses palpable. No hepatomegaly. Extremities: no edema Lymphadenopathy: No cervical adenopathy noted. Neurological: Alert and oriented to person place and time. Skin: Skin is warm and dry. No rashes noted. Psychiatric: Normal mood and affect. Behavior is normal.   ASSESSMENT AND PLAN: 56 year old male with suspected left-sided ulcerative colitis here  for follow-up. He had been off all therapy for a few years and presented for screening colonoscopy with mild active colitis in the left colon. Incidentally also had a small cecal patch, as well as 1 small aphthous ulceration in the ileum. Biopsies of the small bowel showed chronic active inflammation which raises the possibility of Crohn's disease. At the time of this exam he did admit to recent NSAID use which could've caused the small bowel finding. We discussed the implications of Crohn's disease of the small bowel if he had it, and that his current medical therapy is not treating this area. However he feels really well, and I'm not convinced he has Crohn's of the small bowel given his NSAID use at the time of colonoscopy. We discussed consideration for capsule endoscopy or enterography study OFF NSAIDs to evaluate for small bowel disease. He wished to hold off on small bowel evaluation for now, as if he had mild small bowel inflammation he states he would probably decline therapy for it anyway given he feels so well. Otherwise, his colonic disease has symptomatically responded to treatment with Lialda, currently asymptomatic.  At this time recommend the following: - continue Lialda 2.4 gm / day - labs today - CBC, CMET (check kidney function on Lialda), CRP, baseline vitamin D - no NSAIDs, use only tylenol PRN for pain - patient declined small bowel evaluation at this time given he feels well which is reasonable, but will await labs - follow up in 6-12 months or sooner if symptoms  [place] Cellar, MD [facility] Pager [phone]

## 2016-09-02 MED FILL — LIALDA 1.2 GM TABLET SA: 1.2 | 90 days supply | Qty: 180 | Fill #1

## 2016-10-13 MED FILL — CLINDAMYCIN HCL 150 MG CAPS: 150 | 1 days supply | Qty: 4 | Fill #0

## 2016-10-19 MED FILL — HYDROCODON-APAP 10-325: 10-325 | 2 days supply | Qty: 15 | Fill #0

## 2016-10-19 MED FILL — AMOXICILLIN 500 MG CAPSULE: 500 | 7 days supply | Qty: 21 | Fill #0

## 2016-11-23 DIAGNOSIS — N132 Hydronephrosis with renal and ureteral calculous obstruction: Secondary | ICD-10-CM | POA: Diagnosis not present

## 2016-11-23 DIAGNOSIS — R1084 Generalized abdominal pain: Secondary | ICD-10-CM | POA: Diagnosis not present

## 2016-11-23 DIAGNOSIS — R109 Unspecified abdominal pain: Secondary | ICD-10-CM | POA: Diagnosis not present

## 2016-11-23 DIAGNOSIS — R1111 Vomiting without nausea: Secondary | ICD-10-CM | POA: Diagnosis not present

## 2016-11-23 DIAGNOSIS — N201 Calculus of ureter: Secondary | ICD-10-CM | POA: Diagnosis not present

## 2016-11-23 MED FILL — OXYCODONE-ACETAMINOPHEN 5-3: 5-325 | 2 days supply | Qty: 20 | Fill #0

## 2016-11-23 MED FILL — ONDANSETRON HCL 8 MG TAB: 8 | 5 days supply | Qty: 15 | Fill #0

## 2016-11-23 MED FILL — TAMSULOSIN HCL 0.4 MG CAP: 0.4 | 30 days supply | Qty: 30 | Fill #0

## 2016-11-24 ENCOUNTER — Other Ambulatory Visit: Payer: Self-pay | Admitting: Urology

## 2016-11-25 ENCOUNTER — Encounter (HOSPITAL_COMMUNITY): Payer: Self-pay | Admitting: *Deleted

## 2016-11-26 NOTE — H&P (Signed)
Office Visit Report     11/23/2016   --------------------------------------------------------------------------------   Steven Torres  MRN: [id number]  PRIMARY CARE:  Aundra Millet. Birdie Riddle, MD  DOB: [birth year], 56 year old Male  REFERRING:  Eino Farber, MD  SSN: -[id number]  PROVIDER:  Louis Meckel, M.D.    TREATING:  Jimmey Ralph    LOCATION:  [facility] [phone]   --------------------------------------------------------------------------------   CC: I have pain in the flank.  HPI: Steven Torres is a 56 year-old male established patient who is here for flank pain.  The problem is on the left side. His pain started about approximately 11/22/2016. The pain is dull. The intensity of his pain is rated as a 6. The pain is intermittent. The pain does radiate.   Pain medications< makes the pain better. Nothing causes the pain to become worse. He was treated with the following pain medication(s): Hydrocodone.   He has had this same pain previously. He has had kidney stones.     CC: Problems/Symptoms  HPI: His problem is located in back. The intensity of his pain is rated as a 6. He first noticed the symptoms approximately 11/22/2016. Pain medication makes the symptoms better. His symptoms have been present for have been present for days. Associated symptoms do occur at the same time as the symptoms. Associated symptoms occurring with this problem include: nausea. The problem is intermittent and dull. The symptoms are not aggravated or occur with a specific activity.     ALLERGIES: No Allergies    MEDICATIONS: Aspirin 81 MG TABS Oral  Fish Oil CAPS Oral  Glucosamine Chondroitin TABS Oral  Lialda 1.2 gram tablet, delayed release 2 tablet PO Daily  Magnesium  Multi Vitamin/Minerals TABS Oral  Vitamin D TABS Oral     GU PSH: ESWL - 2016 Vasectomy - 2008    NON-GU PSH: Back surgery Hernia Repair - 2013 Knee Arthroscopy, Right    GU PMH: Encounter for  Prostate Cancer screening - 01/06/2016, Prostate cancer screening, - 01/03/2015 Renal calculus, Left nephrolithiasis - 01/03/2015 Ureteral calculus, Left ureteral stone - 2016 Abdominal Pain Unspec, Left flank pain - 2016 Other microscopic hematuria, Microscopic hematuria - 0932 Renal colic, Renal colic - 3557 BPH w/o LUTS, Benign prostatic hypertrophy without lower urinary tract symptoms - 2014 History of urolithiasis, Nephrolithiasis - 2014 Inflammatory Disease Prostate, Unspec, Prostatitis - 2014    NON-GU PMH: Encounter for general adult medical examination without abnormal findings, Encounter for preventive health examination - 2015 Muscle weakness (generalized), Muscle weakness - 2014 Other lack of coordination, Other lack of coordination - 2014 Other muscle spasm, Muscle spasm - 2014 Other specified diseases of anus and rectum, Proctalgia - 2014 Personal history of other diseases of the digestive system, History of ulcerative colitis - 2014, History of esophageal reflux, - 2014 Personal history of other endocrine, nutritional and metabolic disease, History of hypercholesterolemia - 2014    FAMILY HISTORY: Family Health Status - Father alive at age 81 - 74 In Family Family Health Status - Mother's Age - Runs In Family Family Health Status Number - Runs In Family Prostate Cancer - Father   SOCIAL HISTORY: Marital Status: Married Preferred Language: English; Ethnicity: Not Hispanic Or Latino; Race: White Current Smoking Status: Patient has never smoked.  Drinks 1 caffeinated drink per day.    REVIEW OF SYSTEMS:    GU Review Male:   Patient denies frequent urination, hard to postpone urination, burning/ pain with urination, get up at night  to urinate, leakage of urine, stream starts and stops, trouble starting your stream, have to strain to urinate , erection problems, and penile pain.  Gastrointestinal (Upper):   Patient reports nausea. Patient denies vomiting and indigestion/  heartburn.  Gastrointestinal (Lower):   Patient denies diarrhea and constipation.  Constitutional:   Patient denies fever, night sweats, weight loss, and fatigue.  Skin:   Patient denies itching and skin rash/ lesion.  Eyes:   Patient denies blurred vision and double vision.  Ears/ Nose/ Throat:   Patient reports sinus problems. Patient denies sore throat.  Hematologic/Lymphatic:   Patient denies swollen glands and easy bruising.  Cardiovascular:   Patient denies leg swelling and chest pains.  Respiratory:   Patient denies cough and shortness of breath.  Endocrine:   Patient denies excessive thirst.  Musculoskeletal:   Patient reports back pain. Patient denies joint pain.  Neurological:   Patient denies headaches and dizziness.  Psychologic:   Patient denies depression and anxiety.   VITAL SIGNS:      11/23/2016 08:38 AM  Weight 190 lb / 86.18 kg  Height 73 in / 185.42 cm  BP 125/77 mmHg  Pulse 66 /min  BMI 25.1 kg/m   MULTI-SYSTEM PHYSICAL EXAMINATION:    Constitutional: Well-nourished. No physical deformities. Normally developed. Good grooming.   Respiratory: No labored breathing, no use of accessory muscles.   Cardiovascular: Normal temperature, normal extremity pulses, no swelling, no varicosities.   Skin: No paleness, no jaundice, no cyanosis. No lesion, no ulcer, no rash.   Neurologic / Psychiatric: Oriented to time, oriented to place, oriented to person. No depression, no anxiety, no agitation.   Gastrointestinal: No mass, no tenderness, no rigidity, non obese abdomen.      PAST DATA REVIEWED:  Source Of History:  Patient, Family/Caregiver  Records Review:   Previous Patient Records  Urine Test Review:   Urinalysis   12/29/15 12/31/14 01/17/14 12/22/12 12/31/11  PSA  Total PSA 1.04 ng/dl 1.16  0.94  0.86  0.98     PROCEDURES:         C.T. Urogram - [id number]      Moderate/severe left hydronephrosis and dilated to ureter to level of proximal 8-9 mm ureteral stone.  Several stones still noted w/in left renal pelvis. No stones noted in right kidney or along right ureter. No distal left ureteral stones noted.   IMPRESSION:  Moderate left hydronephrosis shows mild increase since previous  study, with 7 mm calculus again seen in proximal left ureter.   Multiple small left renal calculi.          Urinalysis w/Scope - 81001 Dipstick Dipstick Cont'd Micro  Specimen: Voided Bilirubin: Neg WBC/hpf: NS (Not Seen)  Color: Yellow Ketones: Neg RBC/hpf: 3 - 10/hpf  Appearance: Clear Blood: Trace Bacteria: Rare (0-9/hpf)  Specific Gravity: 1.020 Protein: 1+ Cystals: NS (Not Seen)  pH: 6.0 Urobilinogen: 0.2 Casts: NS (Not Seen)  Glucose: Neg Nitrites: Neg Trichomonas: Not Present    Leukocyte Esterase: Neg Mucous: Not Present      Epithelial Cells: 0 - 5/hpf      Yeast: NS (Not Seen)      Sperm: Not Present         Ketoralac 68m - [id number]: 60 Adm. By: DAlferd Patee Unit: mg Lot No [id number]  Route: IM Exp. Date 03/03/2018  Freq: None Mfgr.:   Site: Left Buttock         Phenergan 224m- [id number]: 25  Adm. By: Alferd Patee  Unit: mg Lot No [id number]  Route: IM Exp. Date 08/01/2017  Freq: None Mfgr.:   Site: Right Buttock   ASSESSMENT:      ICD-10 Details  1 GU:   Flank Pain - R10.84 Left, Acute - Ketorolac 60 mg IM. Promethazine 25 mg IM  2   Ureteral calculus - N20.1 Left, Acute - Large 8-9 mm proximal ureteral stone. Oxycodone 5/325 mg 1-2 po Q6 hrs prn. Ondansetron 8 mg 1 po Q8 hrs prn. Tamsulosin 0.4 mg 1 po daily.   3   Ureteral obstruction secondary to calculous - N13.2 Left, Acute - Secondary to proximal left ureteral stone   PLAN:            Medications New Meds: Tamsulosin Hcl 0.4 mg capsule, ext release 24 hr 1 capsule PO Daily   #30  0 Refill(s)  Ondansetron Hcl 8 mg tablet 1 tablet PO Q 8 H PRN   #15  0 Refill(s)  Oxycodone-Acetaminophen 5 mg-325 mg tablet 1-2 tablet PO Q 6 H PRN   #20  0 Refill(s)             Orders X-Rays: C.T. Stone Protocol Without Contrast  X-Ray Notes: History:  Hematuria: Yes/No  Patient to see MD after exam: Yes/No  Previous exam: CT / IVP/ US/ KUB/ None  When:  Where:  Diabetic: Yes/ No  BUN/ Creatinine:  Date of last BUN Creatinine:  Weight in pounds:  Allergy- IV Contrast: Yes/ No  Conflicting diabetic meds: Yes/ No  Diabetic Meds:  Prior Authorization #: [id number]           Schedule Procedure: 11/23/2016 at [facility] - [place] 73m (Toradol Per 15 Mg) - [id number] [facility]: [phone]- Phenergan 252m(Phenergan Per 50 Mg) - [id number]        Document Letter(s):  Created for Patient: Clinical Summary    * Signed by DiJimmey Ralphn 11/23/16 at 2:39 PM (EDT)*

## 2016-11-29 ENCOUNTER — Encounter (HOSPITAL_COMMUNITY): Payer: Self-pay | Admitting: *Deleted

## 2016-11-29 ENCOUNTER — Encounter (HOSPITAL_COMMUNITY)
Admission: RE | Disposition: A | Discharge status: Home / Self Care | Payer: Self-pay | Source: Ambulatory Visit | Attending: Urology

## 2016-11-29 ENCOUNTER — Ambulatory Visit (HOSPITAL_COMMUNITY): Payer: 59

## 2016-11-29 ENCOUNTER — Ambulatory Visit (HOSPITAL_COMMUNITY)
Admission: RE | Admit: 2016-11-29 | Discharge: 2016-11-29 | Disposition: A | Discharge status: Home / Self Care | Payer: 59 | Source: Ambulatory Visit | Attending: Urology | Admitting: Urology

## 2016-11-29 DIAGNOSIS — R109 Unspecified abdominal pain: Secondary | ICD-10-CM | POA: Diagnosis present

## 2016-11-29 DIAGNOSIS — N132 Hydronephrosis with renal and ureteral calculous obstruction: Secondary | ICD-10-CM | POA: Insufficient documentation

## 2016-11-29 DIAGNOSIS — Z7982 Long term (current) use of aspirin: Secondary | ICD-10-CM | POA: Diagnosis not present

## 2016-11-29 DIAGNOSIS — Z87442 Personal history of urinary calculi: Secondary | ICD-10-CM | POA: Insufficient documentation

## 2016-11-29 DIAGNOSIS — N201 Calculus of ureter: Secondary | ICD-10-CM

## 2016-11-29 HISTORY — DX: Personal history of urinary calculi: Z87.442

## 2016-11-29 HISTORY — PX: EXTRACORPOREAL SHOCK WAVE LITHOTRIPSY: SHX1557

## 2016-11-29 IMAGING — CR DG ABDOMEN 1V
1 series · 1 of 1 positions shown · non-contrast
Comparison: CT abdomen pelvis 11/23/2016

CLINICAL DATA: Left ureteral stone pre lithotripsy

EXAM:
ABDOMEN - 1 VIEW

[t abdomen supine]
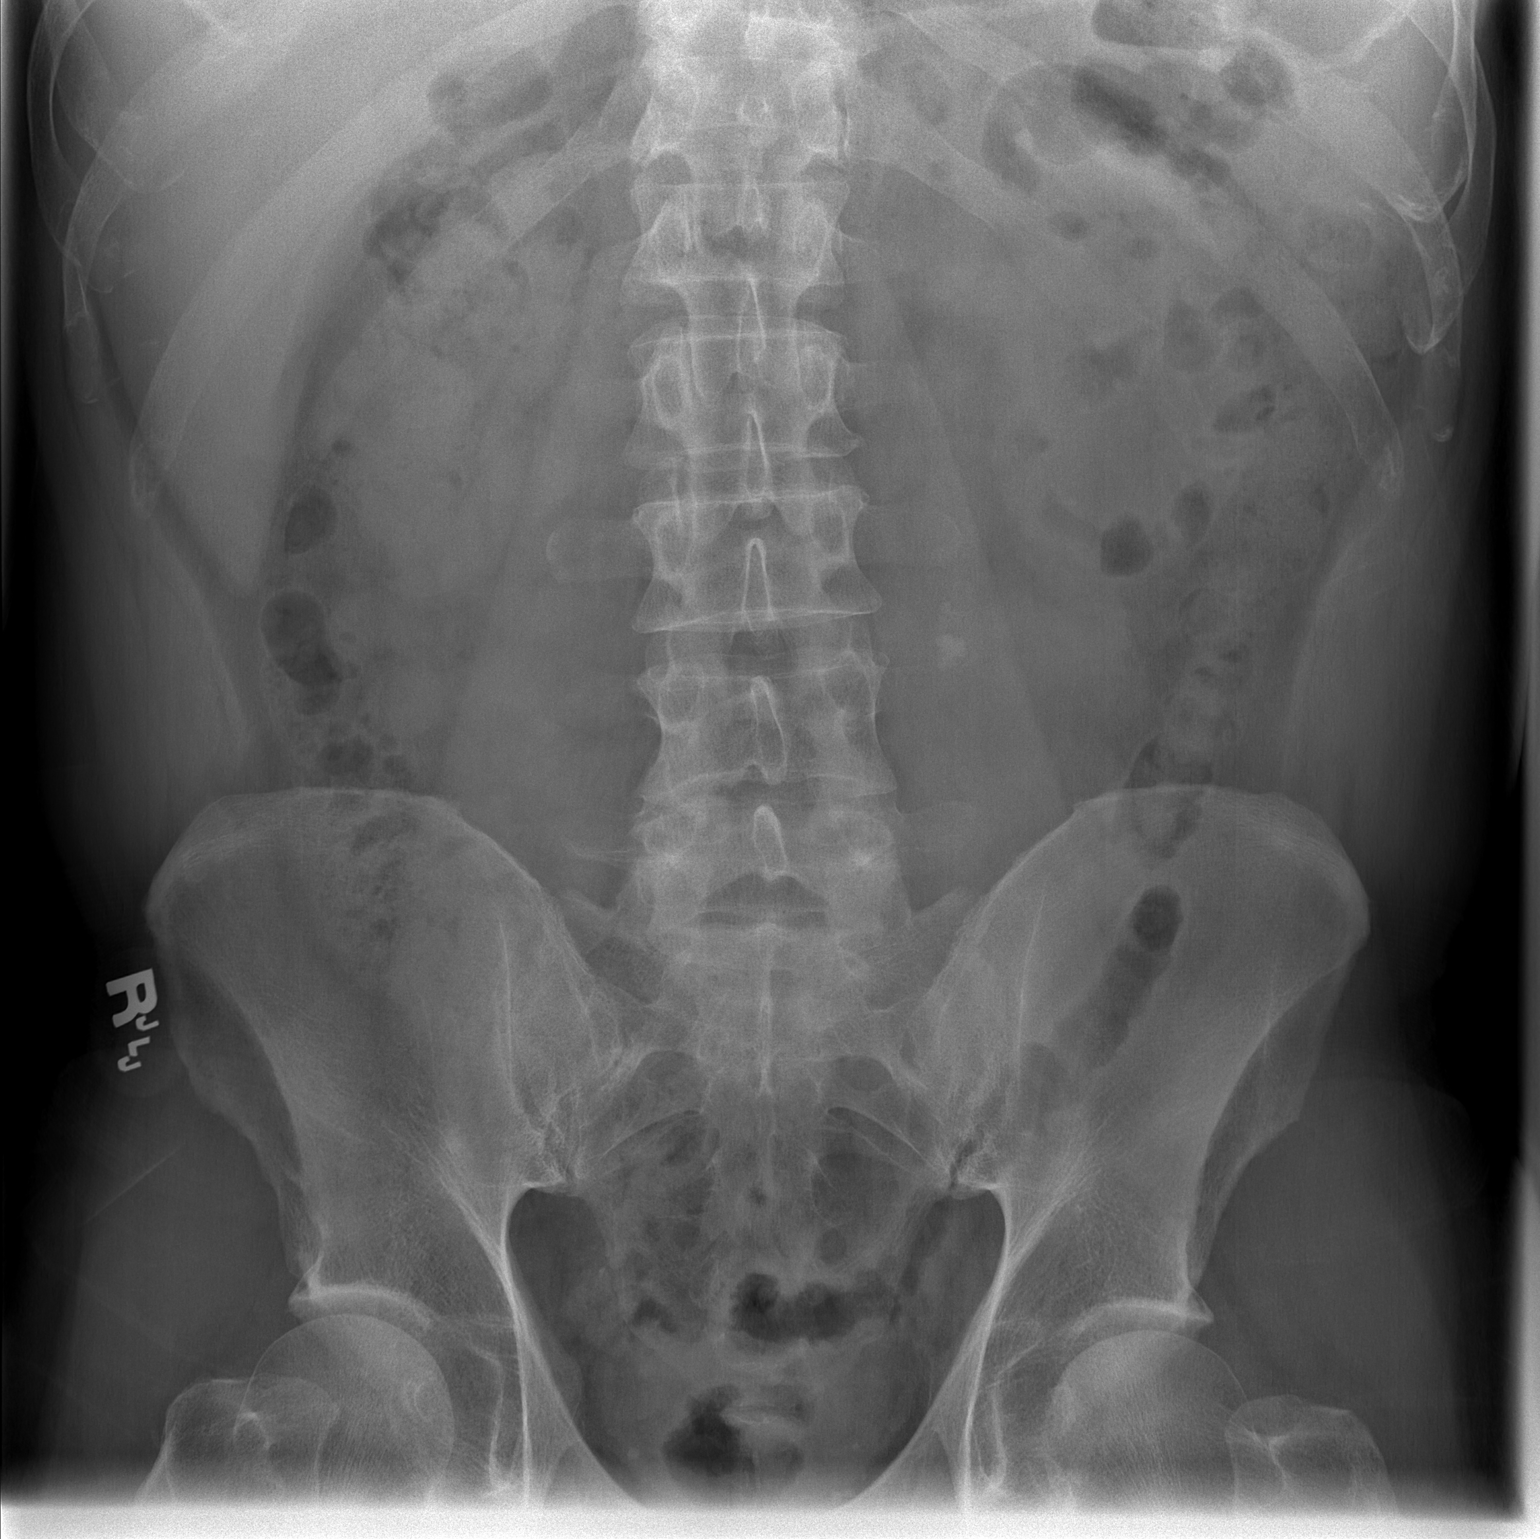

[1 of 1 positions shown; findings below may reference images not displayed]

FINDINGS: At least 2 stones in the left mid ureter similar in position to the
prior study. The calculi are at the level of the L3-4 disc space.
The larger stone measures approximately 7 mm.

Small left lower pole calculus best seen on CT and difficult to see
on the KUB. Normal bowel gas pattern. No skeletal abnormality.
IMPRESSION: At least 2 calculi in the mid left ureter unchanged from recent CT.

## 2016-11-29 SURGERY — LITHOTRIPSY, ESWL
Anesthesia: LOCAL | Laterality: Left

## 2016-11-29 MED ORDER — DIAZEPAM 5 MG PO TABS
10.0000 mg | ORAL_TABLET | ORAL | Status: AC
  Administered 2016-11-29: 10 mg via ORAL
  Filled 2016-11-29: qty 2

## 2016-11-29 MED ORDER — DIPHENHYDRAMINE HCL 25 MG PO CAPS
25.0000 mg | ORAL_CAPSULE | ORAL | Status: AC
  Administered 2016-11-29: 25 mg via ORAL
  Filled 2016-11-29: qty 1

## 2016-11-29 MED ORDER — CIPROFLOXACIN HCL 500 MG PO TABS
500.0000 mg | ORAL_TABLET | ORAL | Status: AC
  Administered 2016-11-29: 500 mg via ORAL
  Filled 2016-11-29: qty 1

## 2016-11-29 MED ORDER — SODIUM CHLORIDE 0.9 % IV SOLN
INTRAVENOUS | Status: DC
  Administered 2016-11-29: 11:00:00 via INTRAVENOUS

## 2016-11-29 NOTE — Interval H&P Note (Signed)
History and Physical Interval Note:  11/29/2016 12:14 PM  Steven Torres  has presented today for surgery, with the diagnosis of LEFT URETERAL STONE  The various methods of treatment have been discussed with the patient and family. After consideration of risks, benefits and other options for treatment, the patient has consented to  Procedure(s): LEFT EXTRACORPOREAL SHOCK WAVE LITHOTRIPSY (ESWL) (Left) as a surgical intervention .  The patient's history has been reviewed, patient examined, no change in status, stable for surgery.  I have reviewed the patient's chart and labs.  Questions were answered to the patient's satisfaction.     Steven Torres,LES

## 2016-11-29 NOTE — Op Note (Signed)
See scanned chart for ESWL operative note.

## 2016-11-29 NOTE — Discharge Instructions (Signed)
1. You should strain your urine and collect all fragments and bring them to your follow up appointment.  2. You should take your pain medication as needed.  Please call if your pain is severe to the point that it is not controlled with your pain medication. 3. You should call if you develop fever > 101 or persistent nausea or vomiting. 4. Your doctor may prescribe tamsulosin to take to help facilitate stone passage.

## 2016-11-30 ENCOUNTER — Encounter (HOSPITAL_COMMUNITY): Payer: Self-pay | Admitting: Urology

## 2016-12-13 MED FILL — LIALDA 1.2 GM TABLET SA: 1.2 | 90 days supply | Qty: 180 | Fill #2

## 2016-12-14 DIAGNOSIS — N132 Hydronephrosis with renal and ureteral calculous obstruction: Secondary | ICD-10-CM | POA: Diagnosis not present

## 2017-01-04 DIAGNOSIS — N202 Calculus of kidney with calculus of ureter: Secondary | ICD-10-CM | POA: Diagnosis not present

## 2017-01-04 DIAGNOSIS — Z125 Encounter for screening for malignant neoplasm of prostate: Secondary | ICD-10-CM | POA: Diagnosis not present

## 2017-03-01 DIAGNOSIS — H5213 Myopia, bilateral: Secondary | ICD-10-CM | POA: Diagnosis not present

## 2017-03-01 DIAGNOSIS — H02401 Unspecified ptosis of right eyelid: Secondary | ICD-10-CM | POA: Diagnosis not present

## 2017-03-18 MED FILL — LIALDA 1.2 GM TABLET SA: 1.2 | 90 days supply | Qty: 180 | Fill #3

## 2017-05-06 ENCOUNTER — Other Ambulatory Visit: Payer: Self-pay

## 2017-05-06 ENCOUNTER — Ambulatory Visit (INDEPENDENT_AMBULATORY_CARE_PROVIDER_SITE_OTHER): Payer: 59 | Admitting: Family Medicine

## 2017-05-06 ENCOUNTER — Encounter: Payer: Self-pay | Admitting: Family Medicine

## 2017-05-06 VITALS — BP 132/84 | HR 92 | Resp 16 | Ht 73.0 in | Wt 201.0 lb

## 2017-05-06 DIAGNOSIS — M2560 Stiffness of unspecified joint, not elsewhere classified: Secondary | ICD-10-CM

## 2017-05-06 DIAGNOSIS — M7989 Other specified soft tissue disorders: Secondary | ICD-10-CM

## 2017-05-06 LAB — CBC WITH DIFFERENTIAL/PLATELET
BASOS ABS: 0.1 10*3/uL (ref 0.0–0.1)
Basophils Relative: 0.9 % (ref 0.0–3.0)
Eosinophils Absolute: 0.2 10*3/uL (ref 0.0–0.7)
Eosinophils Relative: 2.7 % (ref 0.0–5.0)
HCT: 45 % (ref 39.0–52.0)
HEMOGLOBIN: 15.3 g/dL (ref 13.0–17.0)
LYMPHS ABS: 1.2 10*3/uL (ref 0.7–4.0)
Lymphocytes Relative: 18.7 % (ref 12.0–46.0)
MCHC: 33.9 g/dL (ref 30.0–36.0)
MCV: 94.5 fl (ref 78.0–100.0)
MONO ABS: 0.5 10*3/uL (ref 0.1–1.0)
MONOS PCT: 7 % (ref 3.0–12.0)
Neutro Abs: 4.7 10*3/uL (ref 1.4–7.7)
Neutrophils Relative %: 70.7 % (ref 43.0–77.0)
Platelets: 350 10*3/uL (ref 150.0–400.0)
RBC: 4.76 Mil/uL (ref 4.22–5.81)
RDW: 12.3 % (ref 11.5–15.5)
WBC: 6.6 10*3/uL (ref 4.0–10.5)

## 2017-05-06 LAB — BASIC METABOLIC PANEL
BUN: 16 mg/dL (ref 6–23)
CO2: 29 mEq/L (ref 19–32)
CREATININE: 1.06 mg/dL (ref 0.40–1.50)
Calcium: 9.8 mg/dL (ref 8.4–10.5)
Chloride: 102 mEq/L (ref 96–112)
GFR: 76.69 mL/min (ref >60.00)
GLUCOSE: 100 mg/dL — AB (ref 70–99)
POTASSIUM: 4.4 meq/L (ref 3.5–5.1)
Sodium: 141 mEq/L (ref 135–145)

## 2017-05-06 LAB — HEPATIC FUNCTION PANEL
ALBUMIN: 4.7 g/dL (ref 3.5–5.2)
ALT: 28 U/L (ref 0–53)
AST: 19 U/L (ref 0–37)
Alkaline Phosphatase: 96 U/L (ref 39–117)
Bilirubin, Direct: 0.1 mg/dL (ref 0.0–0.3)
Total Bilirubin: 0.9 mg/dL (ref 0.2–1.2)
Total Protein: 7.3 g/dL (ref 6.0–8.3)

## 2017-05-06 LAB — TSH: TSH: 1.5 u[IU]/mL (ref 0.35–4.50)

## 2017-05-06 NOTE — Patient Instructions (Signed)
Schedule your complete physical in 6 months We'll notify you of your lab results and make any changes if needed Limit your salt intake- processed foods, soups, gravy/sauces, etc Increase your water intake! Call with any questions or concerns Happy New Year!

## 2017-05-06 NOTE — Progress Notes (Signed)
   Subjective:    Patient ID: Steven Torres, male    DOB: 05/02/1961, 57 y.o.   MRN: [id number]  HPI Swelling of hands/feet- pt reports that he was 'really stressed out' in August and at that time developed heart palpitations and 'tingling in my hands and feet'.  'shortly after Thanksgiving developed swelling in hands and feet overnight'.  'they're not huge but stiff when I wake up in the AM'.  No swelling during the day.  Wedding band still fits, able to close fists.  Pt reports his feet 'just feel different' in the AM but 'once I get up and start moving around they go back to normal'.  Pt stopped working out in August when he was stressed and diet has also changed (gained 11 lbs).  sxs are occurring daily.  Occasional pain of hands/feet.  No CP, SOB, palpitations since August.  No orthopnea, PND   Review of Systems For ROS see HPI     Objective:   Physical Exam  Constitutional: He is oriented to person, place, and time. He appears well-developed and well-nourished. No distress.  HENT:  Head: Normocephalic and atraumatic.  Eyes: Conjunctivae and EOM are normal. Pupils are equal, round, and reactive to light.  Neck: Normal range of motion. Neck supple. No thyromegaly present.  Cardiovascular: Normal rate, regular rhythm, normal heart sounds and intact distal pulses.  No murmur heard. Pulmonary/Chest: Effort normal and breath sounds normal. No respiratory distress.  Abdominal: Soft. Bowel sounds are normal. He exhibits no distension.  Musculoskeletal: He exhibits no edema (no notable swelling of hands/feet), tenderness or deformity (no joint deformities of hands or wrists).  Lymphadenopathy:    He has no cervical adenopathy.  Neurological: He is alert and oriented to person, place, and time. No cranial nerve deficit.  Skin: Skin is warm and dry.  Psychiatric: He has a normal mood and affect. His behavior is normal.  Vitals reviewed.         Assessment & Plan:  Joint  stiffness/swelling of hands/feet- new.  sxs started around Thanksgiving (which pt admits that his diet has changed and he is no longer exercising- having gained 11 lbs since Sept) and are very mild in nature.  No obvious swelling, no pitting edema.  Pt describes it as more of an AM stiffness.  No joint deformities to suggest RA but will get labs to assess for autoimmune process (pt has hx of IBD).  Check labs to assess for metabolic cause- thyroid, anemia, electrolyte disturbance.  Suspect this is more lifestyle issues and encouraged low Na diet and increased water intake.  Will follow.

## 2017-05-09 DIAGNOSIS — M5431 Sciatica, right side: Secondary | ICD-10-CM | POA: Diagnosis not present

## 2017-05-09 DIAGNOSIS — M9903 Segmental and somatic dysfunction of lumbar region: Secondary | ICD-10-CM | POA: Diagnosis not present

## 2017-05-09 DIAGNOSIS — M9904 Segmental and somatic dysfunction of sacral region: Secondary | ICD-10-CM | POA: Diagnosis not present

## 2017-05-09 DIAGNOSIS — M9905 Segmental and somatic dysfunction of pelvic region: Secondary | ICD-10-CM | POA: Diagnosis not present

## 2017-05-09 LAB — RHEUMATOID FACTOR: Rhuematoid fact SerPl-aCnc: <14 IU/mL (ref <14)

## 2017-05-09 LAB — ANTI-NUCLEAR AB-TITER (ANA TITER): ANA Titer 1: 1:80 {titer} — ABNORMAL HIGH

## 2017-05-09 LAB — ANA: ANA: POSITIVE — AB

## 2017-05-10 ENCOUNTER — Other Ambulatory Visit: Payer: Self-pay | Admitting: Family Medicine

## 2017-05-10 DIAGNOSIS — R768 Other specified abnormal immunological findings in serum: Secondary | ICD-10-CM

## 2017-05-11 DIAGNOSIS — M9905 Segmental and somatic dysfunction of pelvic region: Secondary | ICD-10-CM | POA: Diagnosis not present

## 2017-05-11 DIAGNOSIS — M9904 Segmental and somatic dysfunction of sacral region: Secondary | ICD-10-CM | POA: Diagnosis not present

## 2017-05-11 DIAGNOSIS — M5431 Sciatica, right side: Secondary | ICD-10-CM | POA: Diagnosis not present

## 2017-05-11 DIAGNOSIS — M9903 Segmental and somatic dysfunction of lumbar region: Secondary | ICD-10-CM | POA: Diagnosis not present

## 2017-05-13 DIAGNOSIS — M9905 Segmental and somatic dysfunction of pelvic region: Secondary | ICD-10-CM | POA: Diagnosis not present

## 2017-05-13 DIAGNOSIS — M9904 Segmental and somatic dysfunction of sacral region: Secondary | ICD-10-CM | POA: Diagnosis not present

## 2017-05-13 DIAGNOSIS — M5431 Sciatica, right side: Secondary | ICD-10-CM | POA: Diagnosis not present

## 2017-05-13 DIAGNOSIS — M9903 Segmental and somatic dysfunction of lumbar region: Secondary | ICD-10-CM | POA: Diagnosis not present

## 2017-05-16 DIAGNOSIS — M9903 Segmental and somatic dysfunction of lumbar region: Secondary | ICD-10-CM | POA: Diagnosis not present

## 2017-05-16 DIAGNOSIS — M9905 Segmental and somatic dysfunction of pelvic region: Secondary | ICD-10-CM | POA: Diagnosis not present

## 2017-05-16 DIAGNOSIS — M5431 Sciatica, right side: Secondary | ICD-10-CM | POA: Diagnosis not present

## 2017-05-16 DIAGNOSIS — M9904 Segmental and somatic dysfunction of sacral region: Secondary | ICD-10-CM | POA: Diagnosis not present

## 2017-05-18 DIAGNOSIS — M5431 Sciatica, right side: Secondary | ICD-10-CM | POA: Diagnosis not present

## 2017-05-18 DIAGNOSIS — M9904 Segmental and somatic dysfunction of sacral region: Secondary | ICD-10-CM | POA: Diagnosis not present

## 2017-05-18 DIAGNOSIS — M9905 Segmental and somatic dysfunction of pelvic region: Secondary | ICD-10-CM | POA: Diagnosis not present

## 2017-05-18 DIAGNOSIS — M9903 Segmental and somatic dysfunction of lumbar region: Secondary | ICD-10-CM | POA: Diagnosis not present

## 2017-05-20 DIAGNOSIS — M9905 Segmental and somatic dysfunction of pelvic region: Secondary | ICD-10-CM | POA: Diagnosis not present

## 2017-05-20 DIAGNOSIS — M5431 Sciatica, right side: Secondary | ICD-10-CM | POA: Diagnosis not present

## 2017-05-20 DIAGNOSIS — M9903 Segmental and somatic dysfunction of lumbar region: Secondary | ICD-10-CM | POA: Diagnosis not present

## 2017-05-20 DIAGNOSIS — M9904 Segmental and somatic dysfunction of sacral region: Secondary | ICD-10-CM | POA: Diagnosis not present

## 2017-05-30 ENCOUNTER — Other Ambulatory Visit: Payer: Self-pay

## 2017-05-30 ENCOUNTER — Ambulatory Visit (INDEPENDENT_AMBULATORY_CARE_PROVIDER_SITE_OTHER): Payer: 59 | Admitting: Family Medicine

## 2017-05-30 ENCOUNTER — Encounter: Payer: Self-pay | Admitting: Family Medicine

## 2017-05-30 VITALS — BP 124/78 | HR 78 | Resp 16 | Ht 73.0 in | Wt 206.5 lb

## 2017-05-30 DIAGNOSIS — M5431 Sciatica, right side: Secondary | ICD-10-CM | POA: Diagnosis not present

## 2017-05-30 MED ORDER — PREDNISONE 10 MG PO TABS
ORAL_TABLET | ORAL | 0 refills | Status: DC
Start: 2017-05-30 — End: 2017-06-10

## 2017-05-30 MED FILL — predniSONE 10 MG TABS: 10 | 9 days supply | Qty: 18 | Fill #0

## 2017-05-30 NOTE — Patient Instructions (Signed)
Follow up as needed or as scheduled Start the Prednisone as directed- take w/ food HEAT!! Do some gentle stretching to avoid stiffness Call with any questions or concerns Hang in there!!!

## 2017-05-30 NOTE — Progress Notes (Signed)
   Subjective:    Patient ID: Steven Torres, male    DOB: 08-25-1960, 57 y.o.   MRN: [id number]  HPI Sciatica- R sided.  sxs started ~6 weeks ago.  Radiating from R buttock to just below R knee.  Pain is worse w/ activity.  Not taking ibuprofen due to hx of UC.  Pt has hx of similar intermittently since age 7.  Typically 'able to stretch this out but this time I can't.  Went to chiropractor w/o relief.  No pain on L.  No loss of bowel or bladder control.  No numbness of groin or legs.   Review of Systems For ROS see HPI     Objective:   Physical Exam  Constitutional: He is oriented to person, place, and time. He appears well-developed and well-nourished. No distress.  HENT:  Head: Normocephalic and atraumatic.  Musculoskeletal: He exhibits tenderness (TTP over R sciatic notch.  no TTP over LS spine or paraspinal muscles). He exhibits no edema or deformity.  Neurological: He is alert and oriented to person, place, and time. He has normal reflexes. No cranial nerve deficit. Coordination normal.  (-) SLR on L, (+) SLR on R  Skin: Skin is warm and dry. No rash noted. No erythema.  Psychiatric: He has a normal mood and affect. His behavior is normal. Thought content normal.  Vitals reviewed.         Assessment & Plan:  R sciatica- new.  Pt has hx of similar.  Unable to take NSAIDs due to hx of UC.  Start Pred taper.  Reviewed supportive care and red flags that should prompt return.  Pt expressed understanding and is in agreement w/ plan.

## 2017-06-06 ENCOUNTER — Telehealth: Payer: Self-pay | Admitting: *Deleted

## 2017-06-06 DIAGNOSIS — M5431 Sciatica, right side: Secondary | ICD-10-CM

## 2017-06-06 NOTE — Telephone Encounter (Signed)
Per pt prednisone did not help. Referral placed to sports med.

## 2017-06-06 NOTE — Telephone Encounter (Signed)
Copied from [place]. Topic: Inquiry >> Jun 06, 2017 12:26 PM Moton, Steven Torres, [place] wrote: Reason for CRM: Patient calling because he is still having pain in his right side due to sciatica. He would like to speak with Dr.Tabori or her nurse to see what he should do about this. If someone could give him a call back about this at [phone]

## 2017-06-06 NOTE — Telephone Encounter (Signed)
Did the prednisone help his pain at all?  If yes, we may need to extend his course of prednisone.  If not, he will need a referral to Sports Med or Ortho for complete evaluation

## 2017-06-06 NOTE — Addendum Note (Signed)
Addended by: Davis Gourd on: 06/06/2017 03:17 PM   Modules accepted: Orders

## 2017-06-06 NOTE — Telephone Encounter (Signed)
Please advise 

## 2017-06-10 ENCOUNTER — Encounter: Payer: Self-pay | Admitting: Sports Medicine

## 2017-06-10 ENCOUNTER — Ambulatory Visit (INDEPENDENT_AMBULATORY_CARE_PROVIDER_SITE_OTHER): Payer: 59 | Admitting: Sports Medicine

## 2017-06-10 ENCOUNTER — Ambulatory Visit (INDEPENDENT_AMBULATORY_CARE_PROVIDER_SITE_OTHER): Payer: 59

## 2017-06-10 DIAGNOSIS — M5416 Radiculopathy, lumbar region: Secondary | ICD-10-CM

## 2017-06-10 DIAGNOSIS — M545 Low back pain: Secondary | ICD-10-CM | POA: Diagnosis not present

## 2017-06-10 IMAGING — DX DG LUMBAR SPINE COMPLETE 4+V
5 series · 5 of 5 positions shown · non-contrast
Comparison: CT abdomen and pelvis 11/23/2016.

CLINICAL DATA: Low back pain extending into the right lower
extremity for 2 months.

EXAM:
LUMBAR SPINE - COMPLETE 4+ VIEW

[l-spine ap]
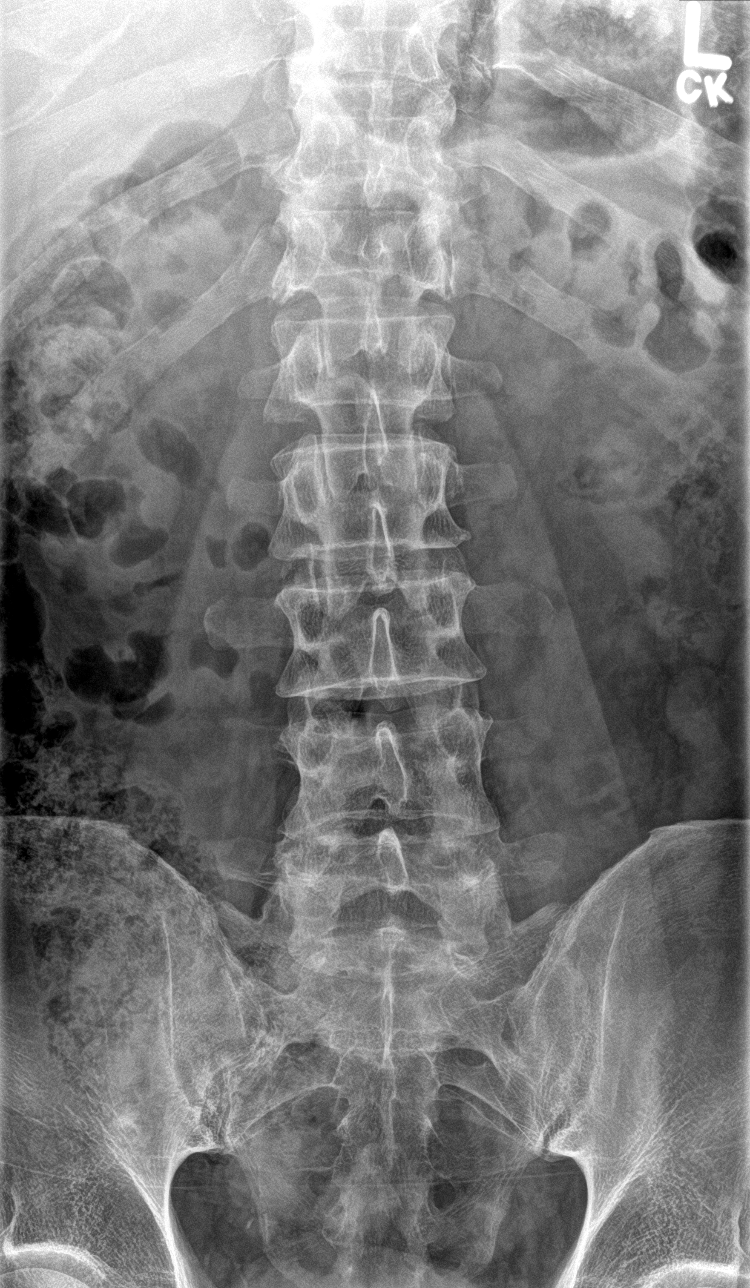

[l-spine obl (1 of 2)]
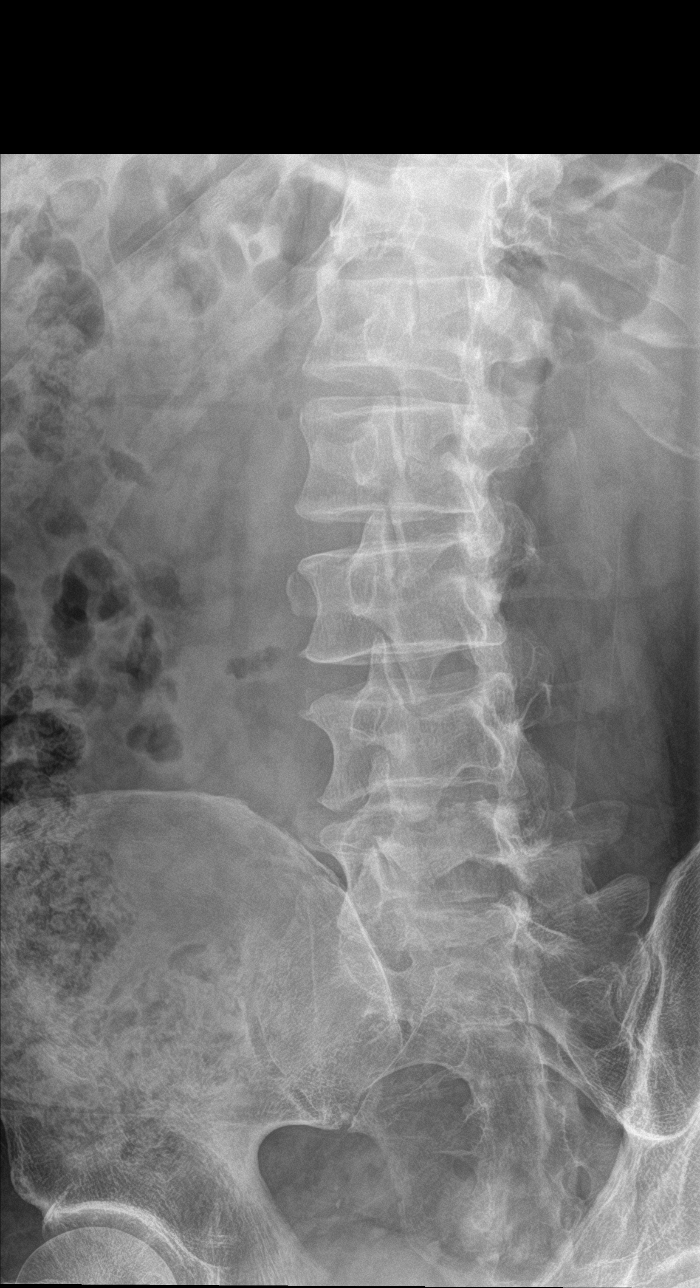

[l-spine obl (2 of 2)]
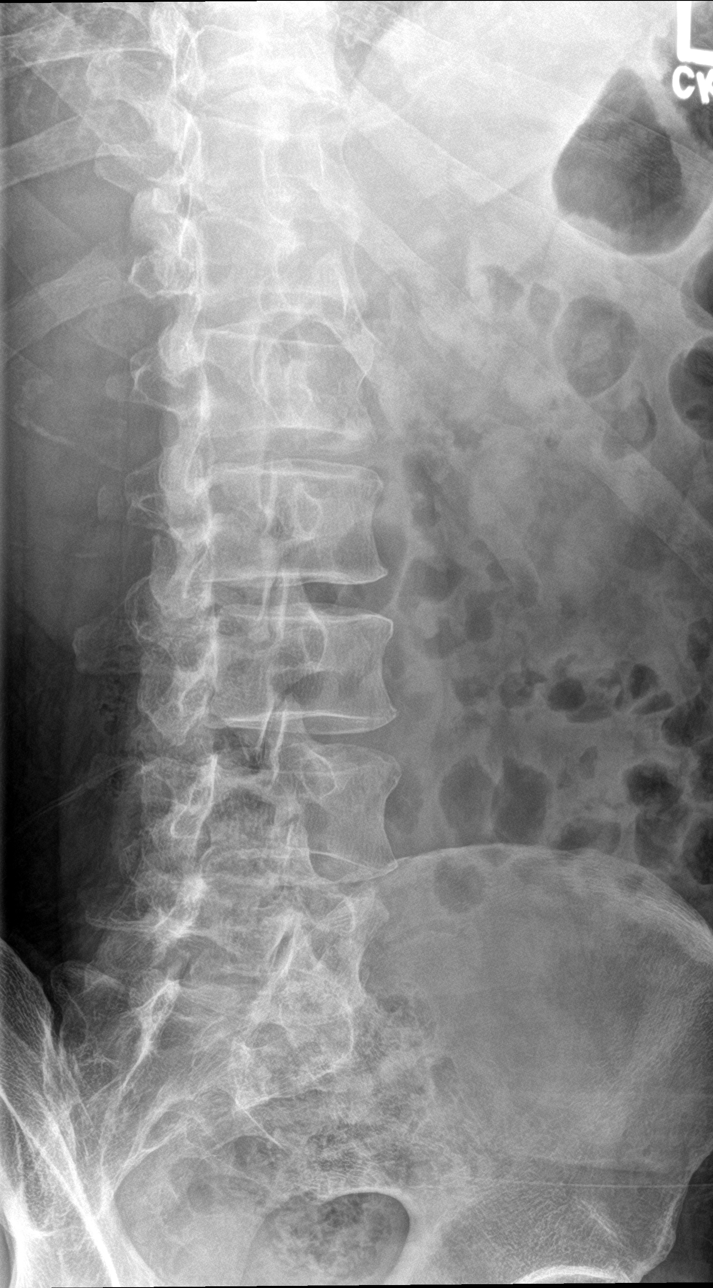

[l-spine lat]
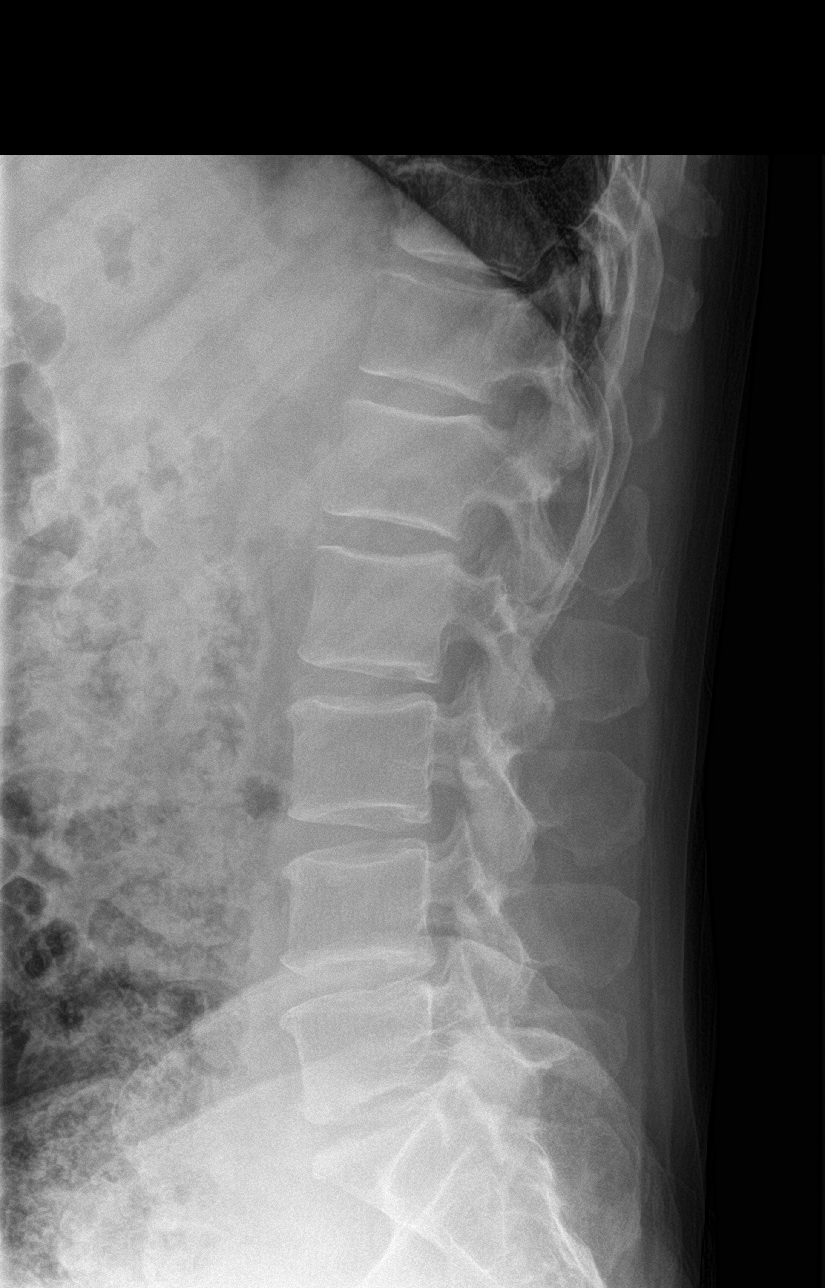

[l-spine spot]
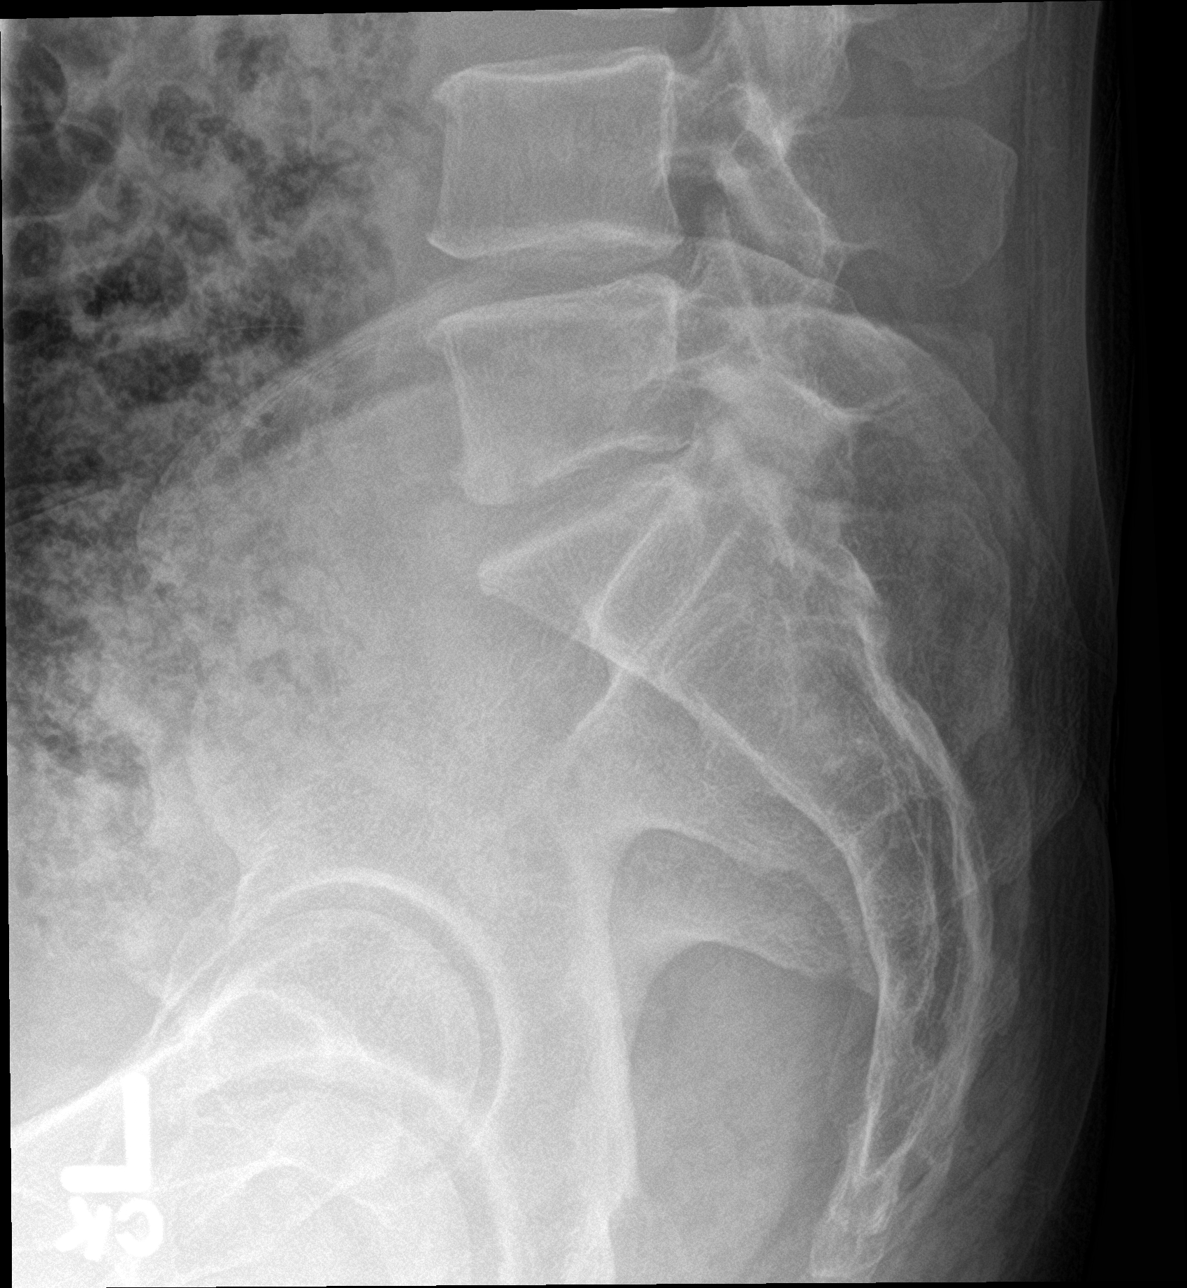

[5 of 5 positions shown; findings below may reference images not displayed]

FINDINGS: Slight leftward curvature of the lumbar spine is centered at L4-5.
Bilateral facet degenerative changes at L5-S1 are worse on the
right. Vertebral body heights are maintained. Slight retrolisthesis
at L5-S1 is stable.
IMPRESSION: Asymmetric facet degenerative changes at L5-S1 with known foraminal
narrowing. This could contribute to right lower extremity radicular
symptoms.

No acute abnormalities.

## 2017-06-10 MED ORDER — DIAZEPAM 5 MG PO TABS: ORAL_TABLET | ORAL | 0 refills | Status: DC

## 2017-06-10 NOTE — Progress Notes (Signed)
 Subjective:    I'm seeing this patient as a consultation for: Dr. Dimple Nanas  CC: Low back pain  HPI: This is a pleasant 57 year old male, on and off for years he has had pain in his back with radiation down the right leg, to the bottom of the foot and the outside toe.  He historically has had what sounds to be an L4-L5 microdiscectomy with laminectomy, immediately when he woke up he had relief of his radicular symptoms.  Unfortunately he had a slow recurrence of symptoms, with axial discogenic back pain and radiation down the right leg in a different distribution from before.  More to the outside of the foot and bottom, i.e. an L5 and/or S1 distribution.  He has failed greater than 6 weeks of physician directed conservative measures at this point and is agreeable to proceed with a more aggressive approach, no bowel or bladder dysfunction, saddle numbness, constitutional symptoms.   I reviewed the past medical history, family history, social history, surgical history, and allergies today and no changes were needed.  Please see the problem list section below in epic for further details.  Past Medical History: Past Medical History:  Diagnosis Date  . Anxiety   . Chronic kidney disease   . GERD (gastroesophageal reflux disease)   . History of kidney stones   . Hyperlipidemia   . Renal stone   . Ulcerative colitis [facility])    Past Surgical History: Past Surgical History:  Procedure Laterality Date  . BACK SURGERY    . EXTRACORPOREAL SHOCK WAVE LITHOTRIPSY Left 11/29/2016   Procedure: LEFT EXTRACORPOREAL SHOCK WAVE LITHOTRIPSY (ESWL);  Surgeon: Raynelle Bring, MD;  Location: [facility];  Service: Urology;  Laterality: Left;  . HERNIA REPAIR    . KNEE ARTHROSCOPY     2- both on right knee  . WISDOM TOOTH EXTRACTION     Social History: Social History   Socioeconomic History  . Marital status: Married    Spouse name: None  . Number of children: 2  . Years of education: None  . Highest  education level: None  Social Needs  . Financial resource strain: None  . Food insecurity - worry: None  . Food insecurity - inability: None  . Transportation needs - medical: None  . Transportation needs - non-medical: None  Occupational History  . Occupation: Passenger transport manager  Tobacco Use  . Smoking status: Never Smoker  . Smokeless tobacco: Never Used  Substance and Sexual Activity  . Alcohol use: No  . Drug use: No  . Sexual activity: None  Other Topics Concern  . None  Social History Narrative  . None   Family History: Family History  Problem Relation Age of Onset  . Prostate cancer Father   . Colon polyps Mother        benign  . Heart attack Mother   . Colon cancer Neg Hx   . Rectal cancer Neg Hx   . Stomach cancer Neg Hx    Allergies: No Known Allergies Medications: See med rec.  Review of Systems: No headache, visual changes, nausea, vomiting, diarrhea, constipation, dizziness, abdominal pain, skin rash, fevers, chills, night sweats, weight loss, swollen lymph nodes, body aches, joint swelling, muscle aches, chest pain, shortness of breath, mood changes, visual or auditory hallucinations.   Objective:   General: Well Developed, well nourished, and in no acute distress.  Neuro:  Extra-ocular muscles intact, able to move all 4 extremities, sensation grossly intact.  Deep tendon reflexes tested  were normal. Psych: Alert and oriented, mood congruent with affect. ENT:  Ears and nose appear unremarkable.  Hearing grossly normal. Neck: Unremarkable overall appearance, trachea midline.  No visible thyroid enlargement. Eyes: Conjunctivae and lids appear unremarkable.  Pupils equal and round. Skin: Warm and dry, no rashes noted.  Cardiovascular: Pulses palpable, no extremity edema. Back Exam:  Inspection: Unremarkable  Motion: Flexion 45 deg, Extension 45 deg, Side Bending to 45 deg bilaterally,  Rotation to 45 deg bilaterally  SLR laying: Negative  XSLR laying:  Negative  Palpable tenderness: None. FABER: negative. Sensory change: Gross sensation intact to all lumbar and sacral dermatomes.  Reflexes: 2+ at both patellar tendons, 2+ at achilles tendons, Babinski's downgoing.  Strength at foot  Plantar-flexion: 5/5 Dorsi-flexion: 5/5 Eversion: 5/5 Inversion: 5/5  Leg strength  Quad: 5/5 Hamstring: 5/5 Hip flexor: 5/5 Hip abductors: 5/5  Gait unremarkable.  Impression and Recommendations:   This case required medical decision making of moderate complexity.  Right lumbar radiculitis Post L4-L5 microdiscectomy. Initial radicular symptoms have resolved and current radicular pain is in a different distribution than prior. More of an L5 and S1 distribution. At this point he is failed greater than 6 weeks of physician directed conservative measures. Adding x-rays, and an MRI with and without contrast considering prior back surgery. We will probably proceed with an epidural pending on where the neuroforaminal stenosis is. I did explain to him the pathophysiology of lumbar radiculitis and limitations that we have been treating it. He will return to see me to go over MRI results. Valium for preprocedural anxiolysis in the MRI scanner.  ___________________________________________ Gwen Her. Dianah Field, M.D., ABFM., CAQSM. Primary Care and Dent Instructor of [employer] [facility] [employer]

## 2017-06-10 NOTE — Assessment & Plan Note (Signed)
Post L4-L5 microdiscectomy. Initial radicular symptoms have resolved and current radicular pain is in a different distribution than prior. More of an L5 and S1 distribution. At this point he is failed greater than 6 weeks of physician directed conservative measures. Adding x-rays, and an MRI with and without contrast considering prior back surgery. We will probably proceed with an epidural pending on where the neuroforaminal stenosis is. I did explain to him the pathophysiology of lumbar radiculitis and limitations that we have been treating it. He will return to see me to go over MRI results. Valium for preprocedural anxiolysis in the MRI scanner.

## 2017-06-15 NOTE — Progress Notes (Deleted)
Office Visit Note  Patient: Steven Torres             Date of Birth: 1961-01-11           MRN: [id number]             PCP: Midge Minium, MD Referring: Midge Minium, MD Visit Date: 06/27/2017 Occupation: @GUAROCC @    Subjective:  No chief complaint on file.   History of Present Illness: [name] is a 57 y.o. male ***   Activities of Daily Living:  Patient reports morning stiffness for *** {minute/hour:19697}.   Patient {ACTIONS;DENIES/REPORTS:21021675::"Denies"} nocturnal pain.  Difficulty dressing/grooming: {ACTIONS;DENIES/REPORTS:21021675::"Denies"} Difficulty climbing stairs: {ACTIONS;DENIES/REPORTS:21021675::"Denies"} Difficulty getting out of chair: {ACTIONS;DENIES/REPORTS:21021675::"Denies"} Difficulty using hands for taps, buttons, cutlery, and/or writing: {ACTIONS;DENIES/REPORTS:21021675::"Denies"}   No Rheumatology ROS completed.   PMFS History:  Patient Active Problem List   Diagnosis Date Noted  . Right lumbar radiculitis 06/10/2017  . Special screening for malignant neoplasms, colon 06/02/2016  . Anxiety state 04/14/2016  . Seasonal allergies 09/15/2015  . Dyslipidemia 04/13/2015  . IFG (impaired fasting glucose) 04/13/2015  . Well adult 03/31/2015  . Left sided colitis ([place]) 03/13/2012    Past Medical History:  Diagnosis Date  . Anxiety   . Chronic kidney disease   . GERD (gastroesophageal reflux disease)   . History of kidney stones   . Hyperlipidemia   . Renal stone   . Ulcerative colitis ([place])     Family History  Problem Relation Age of Onset  . Prostate cancer Father   . Colon polyps Mother        benign  . Heart attack Mother   . Colon cancer Neg Hx   . Rectal cancer Neg Hx   . Stomach cancer Neg Hx    Past Surgical History:  Procedure Laterality Date  . BACK SURGERY    . EXTRACORPOREAL SHOCK WAVE LITHOTRIPSY Left 11/29/2016   Procedure: LEFT EXTRACORPOREAL SHOCK WAVE LITHOTRIPSY (ESWL);  Surgeon: Raynelle Bring, MD;   Location: [facility];  Service: Urology;  Laterality: Left;  . HERNIA REPAIR    . KNEE ARTHROSCOPY     2- both on right knee  . WISDOM TOOTH EXTRACTION     Social History   Social History Narrative  . Not on file     Objective: Vital Signs: There were no vitals taken for this visit.   Physical Exam   Musculoskeletal Exam: ***  CDAI Exam: No CDAI exam completed.    Investigation: No additional findings. Component     Latest Ref Rng & Units 05/06/2017  WBC     4.0 - 10.5 K/uL 6.6  RBC     4.22 - 5.81 Mil/uL 4.76  Hemoglobin     13.0 - 17.0 g/dL 15.3  HCT     39.0 - 52.0 % 45.0  MCV     78.0 - 100.0 fl 94.5  MCHC     30.0 - 36.0 g/dL 33.9  RDW     11.5 - 15.5 % 12.3  Platelets     150.0 - 400.0 K/uL 350.0  Neutrophils     43.0 - 77.0 % 70.7  Lymphocytes     12.0 - 46.0 % 18.7  Monocytes Relative     3.0 - 12.0 % 7.0  Eosinophil     0.0 - 5.0 % 2.7  Basophil     0.0 - 3.0 % 0.9  NEUT#     1.4 - 7.7 K/uL 4.7  Lymphocyte #  0.7 - 4.0 K/uL 1.2  Monocyte #     0.1 - 1.0 K/uL 0.5  Eosinophils Absolute     0.0 - 0.7 K/uL 0.2  Basophils Absolute     0.0 - 0.1 K/uL 0.1  Sodium     135 - 145 mEq/L 141  Potassium     3.5 - 5.1 mEq/L 4.4  Chloride     96 - 112 mEq/L 102  CO2     19 - 32 mEq/L 29  Glucose     70 - 99 mg/dL 100 (H)  BUN     6 - 23 mg/dL 16  Creatinine     0.40 - 1.50 mg/dL 1.06  Calcium     8.4 - 10.5 mg/dL 9.8  GFR     >60.00 mL/min 76.69  Total Bilirubin     0.2 - 1.2 mg/dL 0.9  Bilirubin, Direct     0.0 - 0.3 mg/dL 0.1  Alkaline Phosphatase     39 - 117 U/L 96  AST     0 - 37 U/L 19  ALT     0 - 53 U/L 28  Total Protein     6.0 - 8.3 g/dL 7.3  Albumin     3.5 - 5.2 g/dL 4.7  ANA Pattern 1      HOMOGENEOUS (A)  ANA Titer 1     titer 1:80 (H)  TSH     0.35 - 4.50 uIU/mL 1.50  Anit Nuclear Antibody(ANA)     NEGATIVE POSITIVE (A)  RA Latex Turbid.     <14 IU/mL <14    Imaging: Dg Lumbar Spine Complete  Result  Date: 06/10/2017 CLINICAL DATA:  Low back pain extending into the right lower extremity for 2 months. EXAM: LUMBAR SPINE - COMPLETE 4+ VIEW COMPARISON:  CT abdomen and pelvis 11/23/2016. FINDINGS: Slight leftward curvature of the lumbar spine is centered at L4-5. Bilateral facet degenerative changes at L5-S1 are worse on the right. Vertebral body heights are maintained. Slight retrolisthesis at L5-S1 is stable. IMPRESSION: Asymmetric facet degenerative changes at L5-S1 with known foraminal narrowing. This could contribute to right lower extremity radicular symptoms. No acute abnormalities. Electronically Signed   By: San Morelle M.D.   On: 06/10/2017 12:53    Speciality Comments: No specialty comments available.    Procedures:  No procedures performed Allergies: Patient has no known allergies.   Assessment / Plan:     Visit Diagnoses: No diagnosis found.    Orders: No orders of the defined types were placed in this encounter.  No orders of the defined types were placed in this encounter.   Face-to-face time spent with patient was *** minutes. 50% of time was spent in counseling and coordination of care.  Follow-Up Instructions: No Follow-up on file.   Earnestine Mealing, CMA  Note - This record has been created using Editor, commissioning.  Chart creation errors have been sought, but may not always  have been located. Such creation errors do not reflect on  the standard of medical care.

## 2017-06-17 MED FILL — diazePAM 5 MG TABS: 5 | 2 days supply | Qty: 2 | Fill #0

## 2017-06-20 ENCOUNTER — Ambulatory Visit (INDEPENDENT_AMBULATORY_CARE_PROVIDER_SITE_OTHER): Payer: 59

## 2017-06-20 DIAGNOSIS — M48061 Spinal stenosis, lumbar region without neurogenic claudication: Secondary | ICD-10-CM | POA: Diagnosis not present

## 2017-06-20 DIAGNOSIS — M5116 Intervertebral disc disorders with radiculopathy, lumbar region: Secondary | ICD-10-CM

## 2017-06-20 DIAGNOSIS — M5117 Intervertebral disc disorders with radiculopathy, lumbosacral region: Secondary | ICD-10-CM | POA: Diagnosis not present

## 2017-06-20 DIAGNOSIS — M5106 Intervertebral disc disorders with myelopathy, lumbar region: Secondary | ICD-10-CM

## 2017-06-20 DIAGNOSIS — M5416 Radiculopathy, lumbar region: Secondary | ICD-10-CM

## 2017-06-20 IMAGING — MR MR LUMBAR SPINE WO/W CM
4 of 7 series · 25 of 48 positions shown · IV contrast (19 ML MULTIHANCE)
Comparison: Radiographs dated 06/10/2017 and CT urogram dated
11/23/2016

CLINICAL DATA: Low back pain and right leg pain.

EXAM:
MRI LUMBAR SPINE WITHOUT AND WITH CONTRAST
TECHNIQUE: Multiplanar and multiecho pulse sequences of the lumbar spine were
obtained without and with intravenous contrast.
CONTRAST:  19mL MULTIHANCE GADOBENATE DIMEGLUMINE 529 MG/ML IV SOLN

[Series 2: T2 · sagittal · 4.0mm · 0.81mm/px · 5 of 17 slices shown (1 of 2)]
[im 1/17]
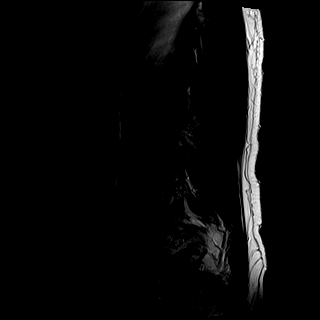
[im 5/17]
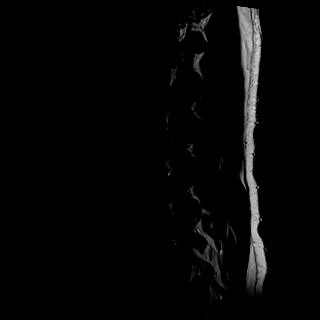
[im 9/17]
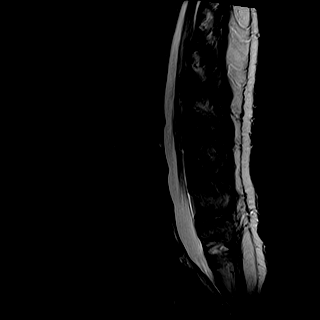
[im 13/17]
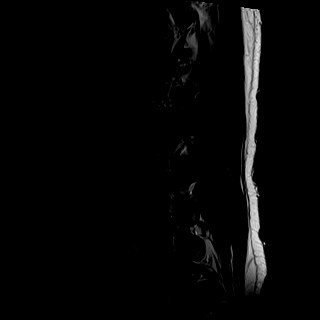
[im 17/17]
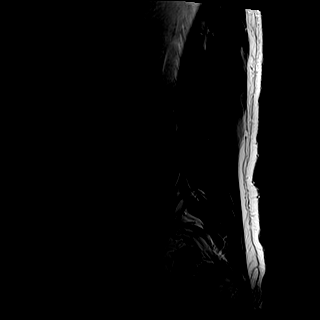

[Series 3: T1 · sagittal · 4.0mm · 0.41mm/px · 5 of 17 slices shown (1 of 2)]
[im 1/17]
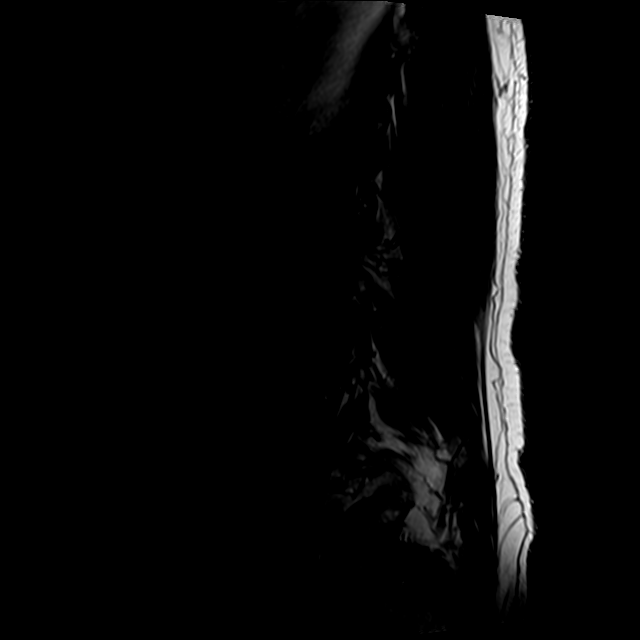
[im 5/17]
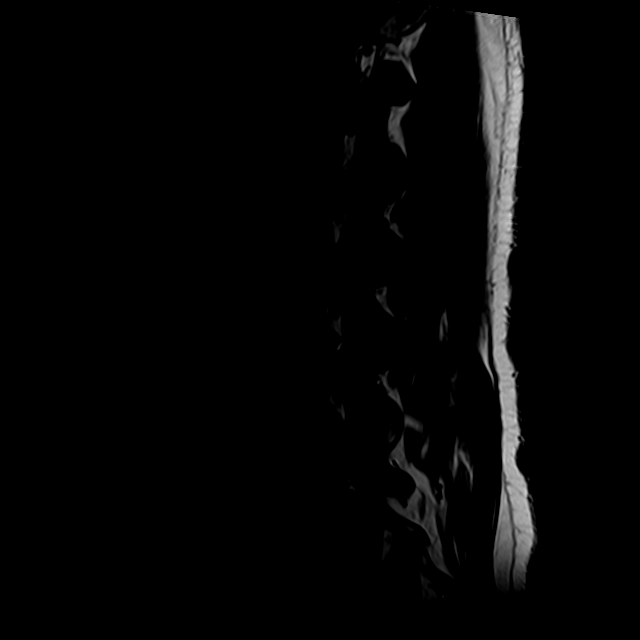
[im 9/17]
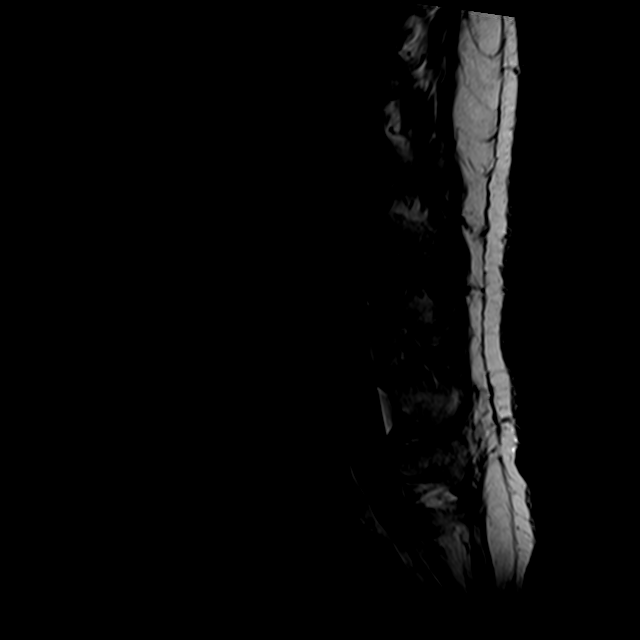
[im 13/17]
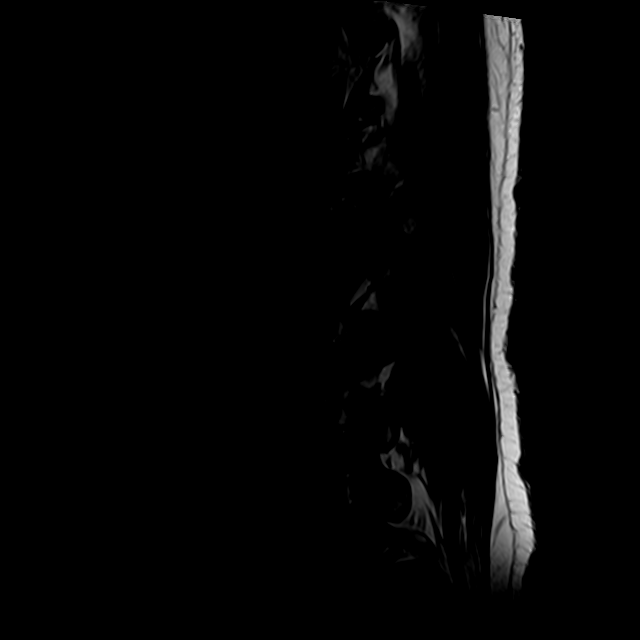
[im 17/17]
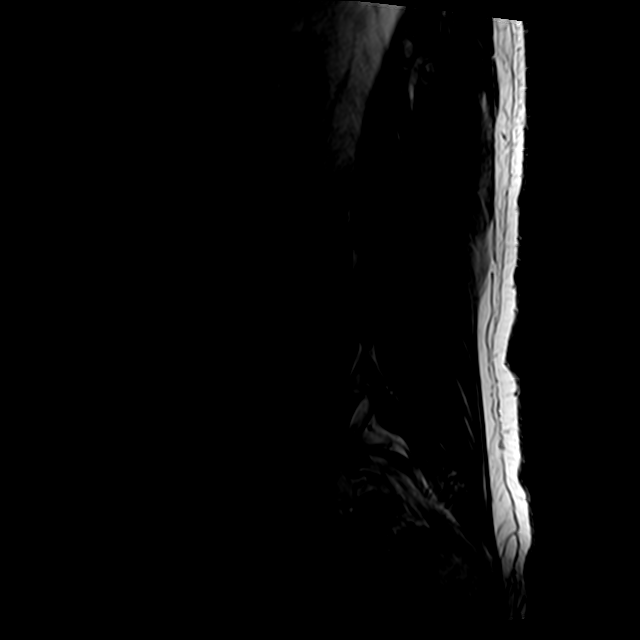

[Series 5: T2 · axial · 4.0mm · 0.78mm/px · z∈[-97,+114]mm · 8 of 40 slices shown (2 of 2)]
[im 1/40]
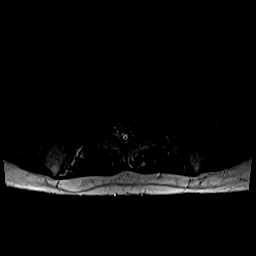
[im 5/40]
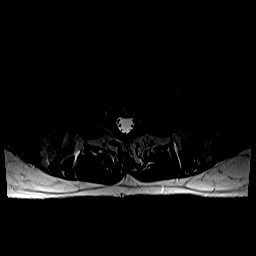
[im 14/40]
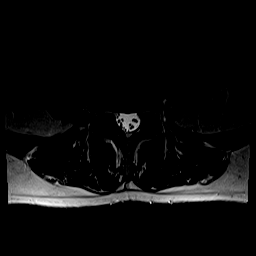
[im 18/40]
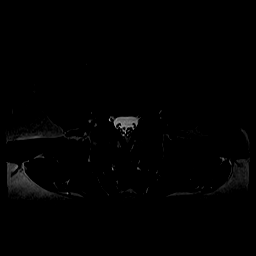
[im 22/40]
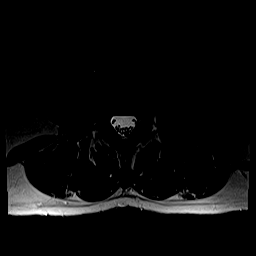
[im 27/40]
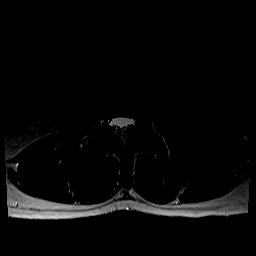
[im 35/40]
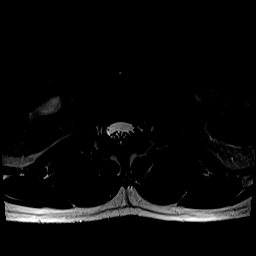
[im 40/40]
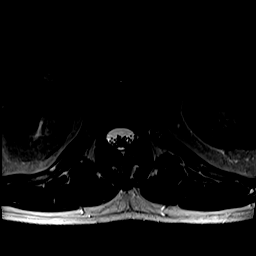

[Series 6: T1 · axial · 4.0mm · 0.39mm/px · z∈[-97,+89]mm · 7 of 40 slices shown (2 of 2)]
[im 1/40]
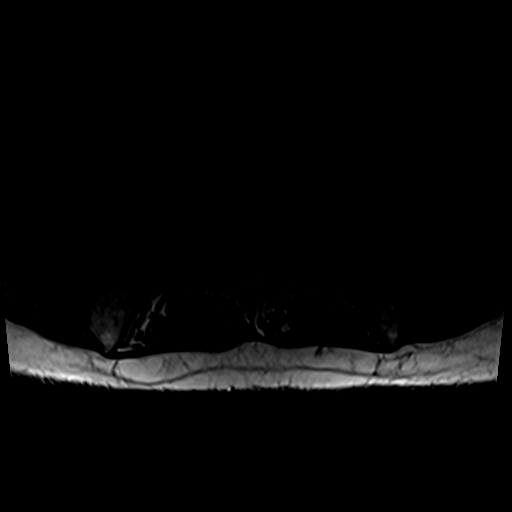
[im 5/40]
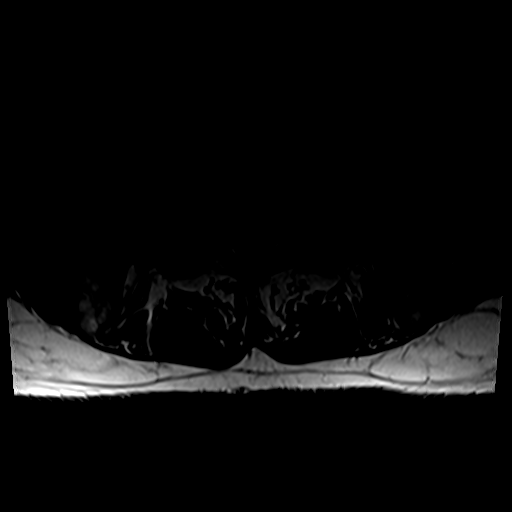
[im 14/40]
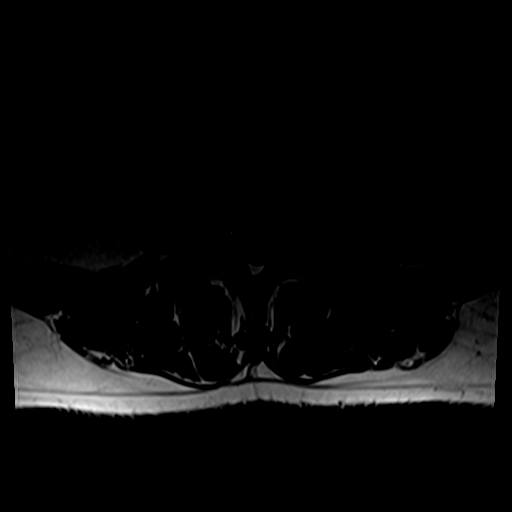
[im 18/40]
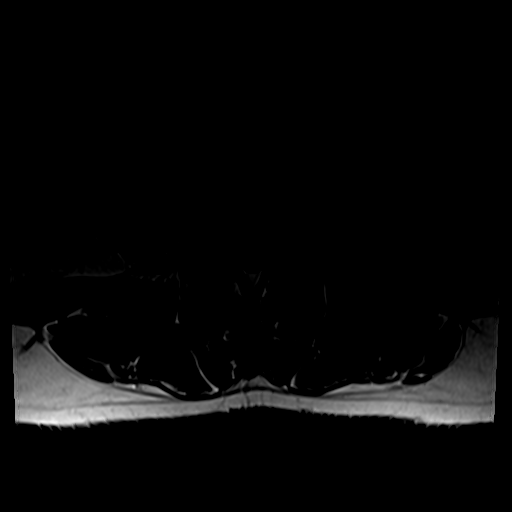
[im 22/40]
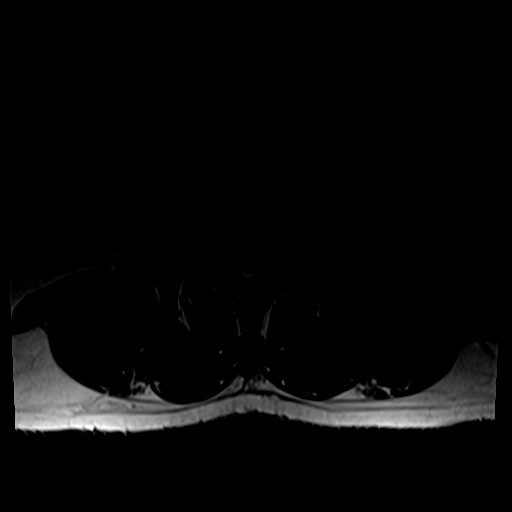
[im 27/40]
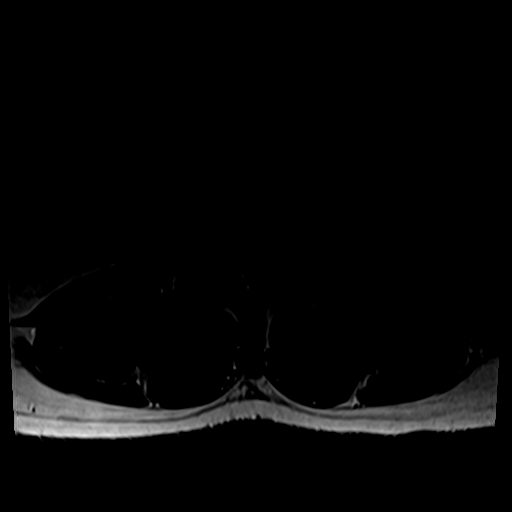
[im 35/40]
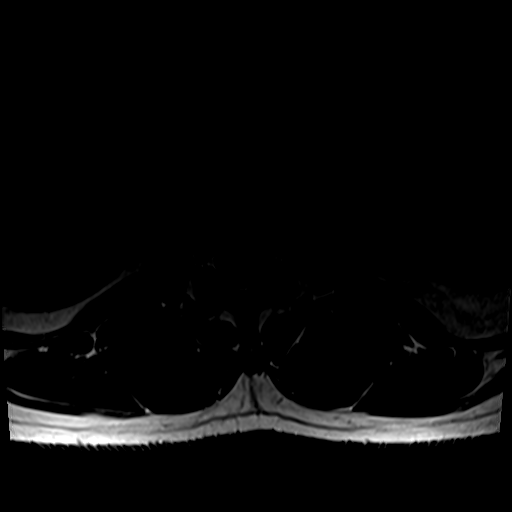

[25 of 48 positions shown; findings below may reference images not displayed]

FINDINGS: Segmentation:  Standard.

Alignment:  4 mm retrolisthesis of L5 on S1.  Otherwise negative.

Vertebrae:  No fracture, evidence of discitis, or bone lesion.

Conus medullaris and cauda equina: Conus extends to the L1-2 level.
Conus and cauda equina appear normal.

Paraspinal and other soft tissues: Negative.

Disc levels:

L1-2: Normal.

L2-3: Tiny broad-based disc bulge with no neural impingement.

L3-4: Minimal broad-based disc bulge with no neural impingement.

L4-5: Disc desiccation with slight disc space narrowing. No disc
bulging or protrusion. No foraminal stenosis.

L5-S1: Focal soft disc extrusion to the right of midline with a mass
effect upon the right S1 nerve. Slight enhancement around the
extrusion after contrast administration. Small benign Schmorl's node
in the anterior aspect of the inferior endplate of L5.
IMPRESSION: 1. Focal soft disc extrusion at L5-S1 to the right with a mass
effect upon the right S1 nerve.
2. Chronic degenerative disc disease at L4-5 without neural
impingement.

## 2017-06-20 MED ORDER — GADOBENATE DIMEGLUMINE 529 MG/ML IV SOLN
19.0000 mL | Freq: Once | INTRAVENOUS | Status: AC | PRN
  Administered 2017-06-20: 19 mL via INTRAVENOUS

## 2017-06-21 ENCOUNTER — Encounter: Payer: Self-pay | Admitting: Sports Medicine

## 2017-06-21 ENCOUNTER — Ambulatory Visit (INDEPENDENT_AMBULATORY_CARE_PROVIDER_SITE_OTHER): Payer: 59 | Admitting: Sports Medicine

## 2017-06-21 DIAGNOSIS — M5416 Radiculopathy, lumbar region: Secondary | ICD-10-CM

## 2017-06-21 NOTE — Assessment & Plan Note (Signed)
S1 distribution on the right, failed greater than 6 weeks of conservative measures, MRI does confirm an L5-S1 soft disc extrusion contacting the intraspinal S1 nerve root on the right. This is concordant with his symptoms, we are going to proceed with a right selective S1 epidural, if insufficient relief, considering how clear this protrusion is we will have neurosurgery consulted for micro-discectomy. Return to see me 1 month after the injection to evaluate response.

## 2017-06-21 NOTE — Progress Notes (Signed)
Subjective:    CC: MRI results  HPI: This is a pleasant 57 year old male, right S1 radiculopathy.  History of what he says is an L4-L5 microdiscectomy for radicular pain, with rapid relief of his radicular pain postop.  He then developed a new distribution, right leg, outer toe.  Failed greater than 6 weeks of physician directed conservative measures so we proceeded with an MRI, the results of which will be dictated below, no bowel or bladder dysfunction, saddle numbness, constitutional symptoms.  I reviewed the past medical history, family history, social history, surgical history, and allergies today and no changes were needed.  Please see the problem list section below in epic for further details.  Past Medical History: Past Medical History:  Diagnosis Date  . Anxiety   . Chronic kidney disease   . GERD (gastroesophageal reflux disease)   . History of kidney stones   . Hyperlipidemia   . Renal stone   . Ulcerative colitis [facility])    Past Surgical History: Past Surgical History:  Procedure Laterality Date  . BACK SURGERY    . EXTRACORPOREAL SHOCK WAVE LITHOTRIPSY Left 11/29/2016   Procedure: LEFT EXTRACORPOREAL SHOCK WAVE LITHOTRIPSY (ESWL);  Surgeon: Raynelle Bring, MD;  Location: [facility];  Service: Urology;  Laterality: Left;  . HERNIA REPAIR    . KNEE ARTHROSCOPY     2- both on right knee  . WISDOM TOOTH EXTRACTION     Social History: Social History   Socioeconomic History  . Marital status: Married    Spouse name: None  . Number of children: 2  . Years of education: None  . Highest education level: None  Social Needs  . Financial resource strain: None  . Food insecurity - worry: None  . Food insecurity - inability: None  . Transportation needs - medical: None  . Transportation needs - non-medical: None  Occupational History  . Occupation: Passenger transport manager  Tobacco Use  . Smoking status: Never Smoker  . Smokeless tobacco: Never Used  Substance and Sexual Activity    . Alcohol use: No  . Drug use: No  . Sexual activity: None  Other Topics Concern  . None  Social History Narrative  . None   Family History: Family History  Problem Relation Age of Onset  . Prostate cancer Father   . Colon polyps Mother        benign  . Heart attack Mother   . Colon cancer Neg Hx   . Rectal cancer Neg Hx   . Stomach cancer Neg Hx    Allergies: No Known Allergies Medications: See med rec.  Review of Systems: No fevers, chills, night sweats, weight loss, chest pain, or shortness of breath.   Objective:    General: Well Developed, well nourished, and in no acute distress.  Neuro: Alert and oriented x3, extra-ocular muscles intact, sensation grossly intact.  HEENT: Normocephalic, atraumatic, pupils equal round reactive to light, neck supple, no masses, no lymphadenopathy, thyroid nonpalpable.  Skin: Warm and dry, no rashes. Cardiac: Regular rate and rhythm, no murmurs rubs or gallops, no lower extremity edema.  Respiratory: Clear to auscultation bilaterally. Not using accessory muscles, speaking in full sentences.  MRI personally reviewed, there is lesser degenerative disc disease L4-L5, he does have a fairly large soft disc extrusion at L5-S1 clear contact to the right S1 nerve root.  Impression and Recommendations:    Right lumbar radiculitis S1 distribution on the right, failed greater than 6 weeks of conservative measures, MRI does confirm  an L5-S1 soft disc extrusion contacting the intraspinal S1 nerve root on the right. This is concordant with his symptoms, we are going to proceed with a right selective S1 epidural, if insufficient relief, considering how clear this protrusion is we will have neurosurgery consulted for micro-discectomy. Return to see me 1 month after the injection to evaluate response. ___________________________________________ Gwen Her. Dianah Field, M.D., ABFM., CAQSM. Primary Care and [place] Instructor of [place] [employer] [facility] of [employer]

## 2017-06-27 ENCOUNTER — Ambulatory Visit: Payer: Self-pay | Admitting: Rheumatology

## 2017-06-29 ENCOUNTER — Other Ambulatory Visit: Payer: 59

## 2017-06-29 ENCOUNTER — Ambulatory Visit
Admission: RE | Admit: 2017-06-29 | Discharge: 2017-06-29 | Disposition: A | Discharge status: Home / Self Care | Payer: 59 | Source: Ambulatory Visit | Attending: Sports Medicine | Admitting: Sports Medicine

## 2017-06-29 ENCOUNTER — Encounter: Payer: Self-pay | Admitting: Gastroenterology

## 2017-06-29 DIAGNOSIS — M5126 Other intervertebral disc displacement, lumbar region: Secondary | ICD-10-CM | POA: Diagnosis not present

## 2017-06-29 IMAGING — XA DG EPIDURAL/NERVE ROOT
2 series · 2 of 2 positions shown · non-contrast
Comparison: none

CLINICAL DATA: Right leg radiculopathy. Posterior pain extending
from the right buttock to the right foot. Focal disc extrusion at
L5-S1 affecting the right S1 nerve.

[Series 4: ortho standard · 1 of 1 slices shown (1 of 2)]
[im 1/1]
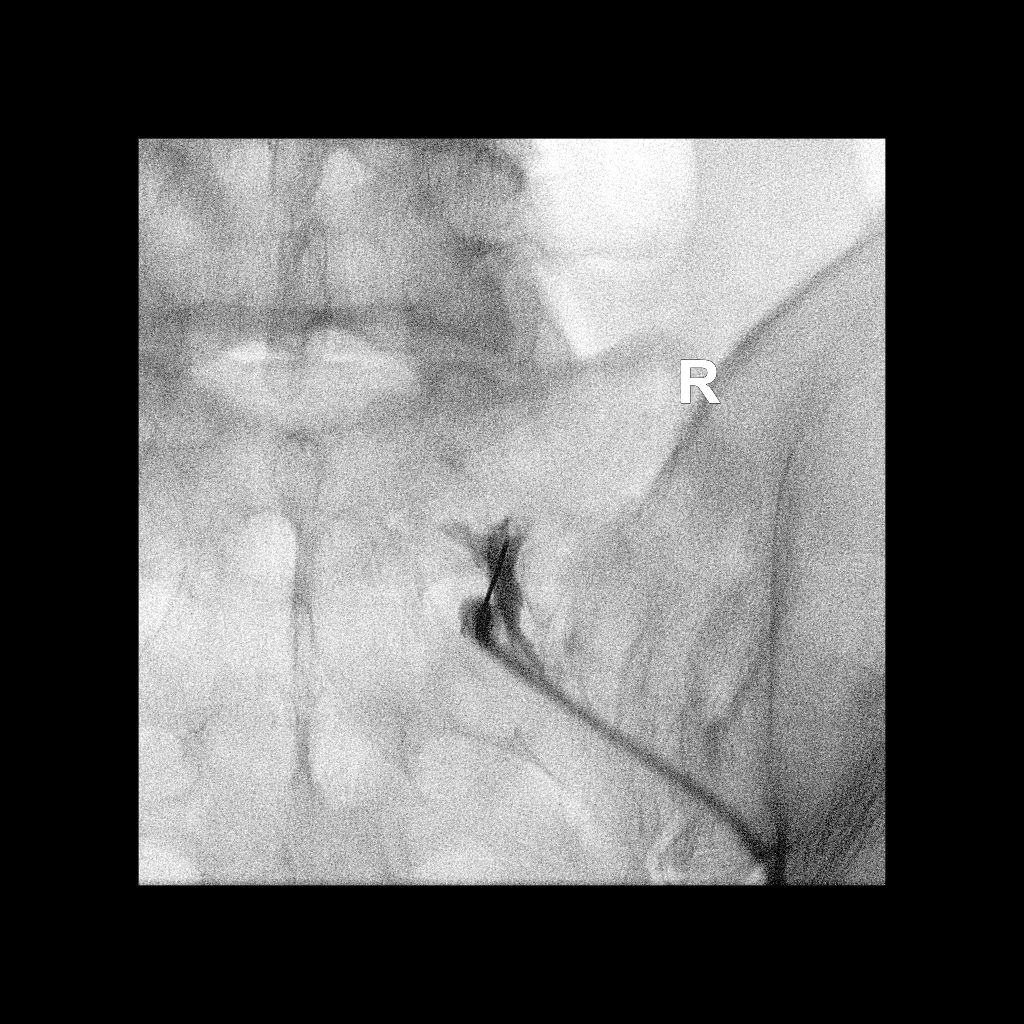

[Series 5: ortho standard · 1 of 1 slices shown (2 of 2)]
[im 1/1]
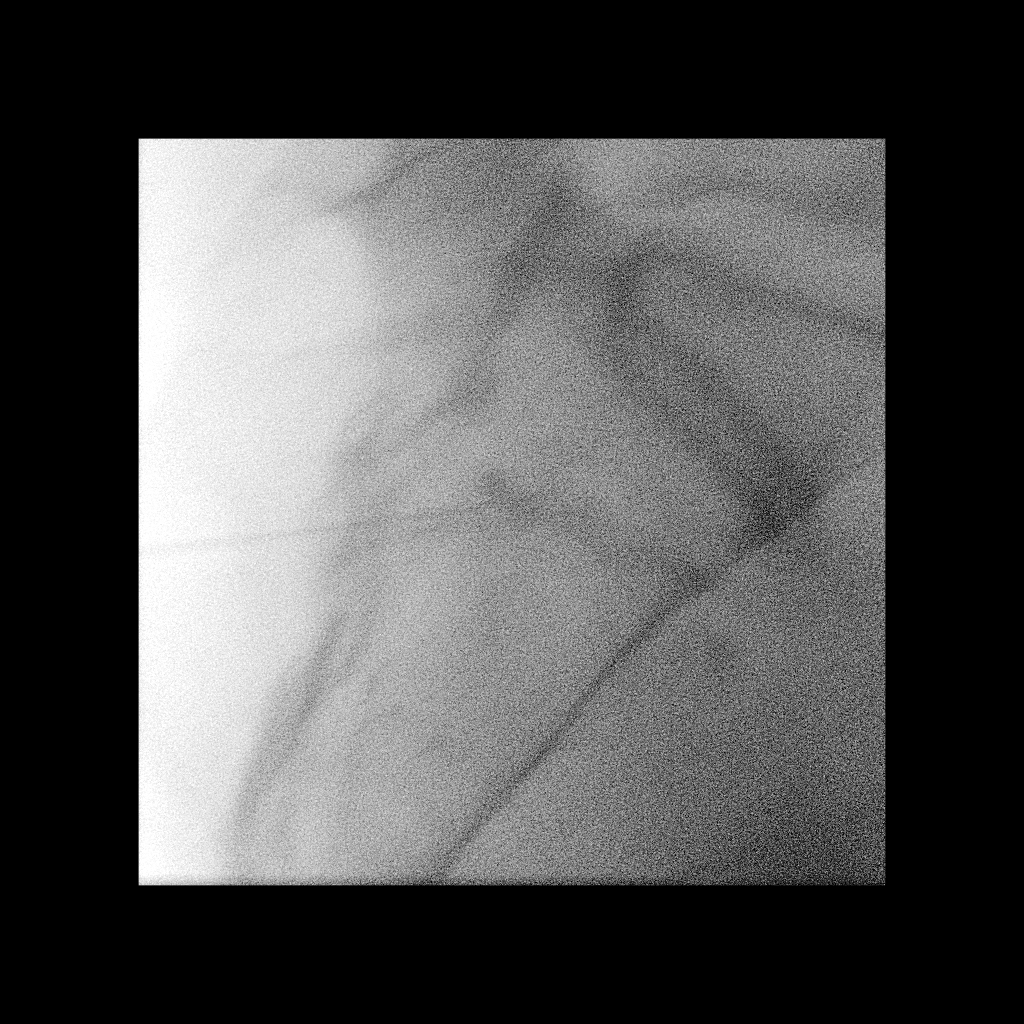

[2 of 2 positions shown; findings below may reference images not displayed]

EXAM:
EPIDURAL/NERVE ROOT

FLUOROSCOPY TIME:  Radiation Exposure Index (as provided by the
fluoroscopic device): 52.82 Gy*m2

If the device does not provide the exposure index:

Fluoroscopy Time:  41 seconds.

Number of Acquired Images:  0

PROCEDURE:
The procedure, risks, benefits, and alternatives were explained to
the patient. Questions regarding the procedure were encouraged and
answered. The patient understands and consents to the procedure.

RIGHT S1 NERVE ROOT BLOCK AND TRANSFORAMINAL EPIDURAL: A posterior
oblique approach was taken to the S1 neural foramen on the right
using a 3.5 inch 22 gauge spinal needle. Injection of Isovue M 200
outlined the right S1 nerve root and showed good epidural spread. No
vascular opacification is seen. 120 mg of Depo-Medrol mixed with 2
mL of 1% lidocaine were instilled. The procedure was well-tolerated,
and the patient was discharged thirty minutes following the
injection in good condition.

COMPLICATIONS:
None immediate.
IMPRESSION: Technically successful injection consisting of a right S1 nerve root
block and transforaminal epidural.

## 2017-06-29 MED ORDER — IOPAMIDOL (ISOVUE-M 200) INJECTION 41%
1.0000 mL | Freq: Once | INTRAMUSCULAR | Status: AC
  Administered 2017-06-29: 1 mL via EPIDURAL

## 2017-06-29 MED ORDER — METHYLPREDNISOLONE ACETATE 40 MG/ML INJ SUSP (RADIOLOG
120.0000 mg | Freq: Once | INTRAMUSCULAR | Status: AC
  Administered 2017-06-29: 120 mg via EPIDURAL

## 2017-06-29 MED FILL — LIALDA 1.2 GM TABLET SA: 1.2 | 90 days supply | Qty: 180 | Fill #0

## 2017-06-29 NOTE — Discharge Instructions (Signed)
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
## 2017-07-12 ENCOUNTER — Encounter: Payer: Self-pay | Admitting: Sports Medicine

## 2017-07-12 ENCOUNTER — Ambulatory Visit (INDEPENDENT_AMBULATORY_CARE_PROVIDER_SITE_OTHER): Payer: 59 | Admitting: Sports Medicine

## 2017-07-12 DIAGNOSIS — M5416 Radiculopathy, lumbar region: Secondary | ICD-10-CM | POA: Diagnosis not present

## 2017-07-12 NOTE — Assessment & Plan Note (Signed)
Right S1 distribution radiculopathy, he failed conservative measures, the disc extrusion does contact the intraspinal S1 nerve root on the right. He did not respond, not even temporarily to a right selective S1 epidural. Because his protruding disc is right at the L5-S1 foramen I am going to proceed with a right L5-S1 transforaminal epidural. I would however like him to get a second opinion from [place] [facility] downstairs.

## 2017-07-12 NOTE — Progress Notes (Signed)
Subjective:    CC: Follow-up  HPI: Lumbar radiculopathy: With clear contact of the L5-S1 disc with the right intraspinal S1 nerve root, after failing physical therapy, we proceeded with a selective S1 epidural that did not provide any relief, not even temporary.  No bowel or bladder dysfunction, saddle numbness, constitutional symptoms.  I reviewed the past medical history, family history, social history, surgical history, and allergies today and no changes were needed.  Please see the problem list section below in epic for further details.  Past Medical History: Past Medical History:  Diagnosis Date  . Anxiety   . Chronic kidney disease   . GERD (gastroesophageal reflux disease)   . History of kidney stones   . Hyperlipidemia   . Renal stone   . Ulcerative colitis [facility])    Past Surgical History: Past Surgical History:  Procedure Laterality Date  . BACK SURGERY    . EXTRACORPOREAL SHOCK WAVE LITHOTRIPSY Left 11/29/2016   Procedure: LEFT EXTRACORPOREAL SHOCK WAVE LITHOTRIPSY (ESWL);  Surgeon: Raynelle Bring, MD;  Location: [facility];  Service: Urology;  Laterality: Left;  . HERNIA REPAIR    . KNEE ARTHROSCOPY     2- both on right knee  . WISDOM TOOTH EXTRACTION     Social History: Social History   Socioeconomic History  . Marital status: Married    Spouse name: None  . Number of children: 2  . Years of education: None  . Highest education level: None  Social Needs  . Financial resource strain: None  . Food insecurity - worry: None  . Food insecurity - inability: None  . Transportation needs - medical: None  . Transportation needs - non-medical: None  Occupational History  . Occupation: Passenger transport manager  Tobacco Use  . Smoking status: Never Smoker  . Smokeless tobacco: Never Used  Substance and Sexual Activity  . Alcohol use: No  . Drug use: No  . Sexual activity: None  Other Topics Concern  . None  Social History Narrative  . None   Family History: Family  History  Problem Relation Age of Onset  . Prostate cancer Father   . Colon polyps Mother        benign  . Heart attack Mother   . Colon cancer Neg Hx   . Rectal cancer Neg Hx   . Stomach cancer Neg Hx    Allergies: No Known Allergies Medications: See med rec.  Review of Systems: No fevers, chills, night sweats, weight loss, chest pain, or shortness of breath.   Objective:    General: Well Developed, well nourished, and in no acute distress.  Neuro: Alert and oriented x3, extra-ocular muscles intact, sensation grossly intact.  HEENT: Normocephalic, atraumatic, pupils equal round reactive to light, neck supple, no masses, no lymphadenopathy, thyroid nonpalpable.  Skin: Warm and dry, no rashes. Cardiac: Regular rate and rhythm, no murmurs rubs or gallops, no lower extremity edema.  Respiratory: Clear to auscultation bilaterally. Not using accessory muscles, speaking in full sentences.  Impression and Recommendations:    Right lumbar radiculitis Right S1 distribution radiculopathy, he failed conservative measures, the disc extrusion does contact the intraspinal S1 nerve root on the right. He did not respond, not even temporarily to a right selective S1 epidural. Because his protruding disc is right at the L5-S1 foramen I am going to proceed with a right L5-S1 transforaminal epidural. I would however like him to get a second opinion from [place] [facility] downstairs. ___________________________________________ Gwen Her. Dianah Field, M.D., ABFM., CAQSM. Primary  Care and [place] Instructor of [place] of [facility] of Medicine

## 2017-07-18 ENCOUNTER — Ambulatory Visit (INDEPENDENT_AMBULATORY_CARE_PROVIDER_SITE_OTHER): Payer: 59

## 2017-07-18 ENCOUNTER — Ambulatory Visit (INDEPENDENT_AMBULATORY_CARE_PROVIDER_SITE_OTHER): Payer: Self-pay

## 2017-07-18 ENCOUNTER — Ambulatory Visit: Payer: 59 | Admitting: Rheumatology

## 2017-07-18 ENCOUNTER — Encounter: Payer: Self-pay | Admitting: Rheumatology

## 2017-07-18 VITALS — BP 137/79 | HR 67 | Resp 17 | Ht 73.0 in | Wt 208.5 lb

## 2017-07-18 DIAGNOSIS — M5136 Other intervertebral disc degeneration, lumbar region: Secondary | ICD-10-CM | POA: Diagnosis not present

## 2017-07-18 DIAGNOSIS — Z8719 Personal history of other diseases of the digestive system: Secondary | ICD-10-CM | POA: Diagnosis not present

## 2017-07-18 DIAGNOSIS — R768 Other specified abnormal immunological findings in serum: Secondary | ICD-10-CM

## 2017-07-18 DIAGNOSIS — M79642 Pain in left hand: Secondary | ICD-10-CM

## 2017-07-18 DIAGNOSIS — M79672 Pain in left foot: Secondary | ICD-10-CM

## 2017-07-18 DIAGNOSIS — M79671 Pain in right foot: Secondary | ICD-10-CM

## 2017-07-18 DIAGNOSIS — Z8639 Personal history of other endocrine, nutritional and metabolic disease: Secondary | ICD-10-CM

## 2017-07-18 DIAGNOSIS — M79641 Pain in right hand: Secondary | ICD-10-CM | POA: Diagnosis not present

## 2017-07-18 NOTE — Patient Instructions (Signed)
Natural anti-inflammatories  You can purchase these at [employer], [employer] or online.  . Turmeric (capsules)  . Ginger (ginger root or capsules)  . Omega 3 (Fish, flax seeds, chia seeds, walnuts, almonds)  . Tart cherry (dried or extract)   Patient should be under the care of a physician while taking these supplements. This may not be reproduced without the permission of Dr. Bo Merino.     Hand Exercises Hand exercises can be helpful to almost anyone. These exercises can strengthen the hands, improve flexibility and movement, and increase blood flow to the hands. These results can make work and daily tasks easier. Hand exercises can be especially helpful for people who have joint pain from arthritis or have nerve damage from overuse (carpal tunnel syndrome). These exercises can also help people who have injured a hand. Most of these hand exercises are fairly gentle stretching routines. You can do them often throughout the day. Still, it is a good idea to ask your health care provider which exercises would be best for you. Warming your hands before exercise may help to reduce stiffness. You can do this with gentle massage or by placing your hands in warm water for 15 minutes. Also, make sure you pay attention to your level of hand pain as you begin an exercise routine. Exercises Knuckle Bend Repeat this exercise 5-10 times with each hand. 1. Stand or sit with your arm, hand, and all five fingers pointed straight up. Make sure your wrist is straight. 2. Gently and slowly bend your fingers down and inward until the tips of your fingers are touching the tops of your palm. 3. Hold this position for a few seconds. 4. Extend your fingers out to their original position, all pointing straight up again.  Finger Fan Repeat this exercise 5-10 times with each hand. 1. Hold your arm and hand out in front of you. Keep your wrist straight. 2. Squeeze your hand into a fist. 3. Hold this  position for a few seconds. 4. Edison Simon out, or spread apart, your hand and fingers as much as possible, stretching every joint fully.  Tabletop Repeat this exercise 5-10 times with each hand. 1. Stand or sit with your arm, hand, and all five fingers pointed straight up. Make sure your wrist is straight. 2. Gently and slowly bend your fingers at the knuckles where they meet the hand until your hand is making an upside-down L shape. Your fingers should form a tabletop. 3. Hold this position for a few seconds. 4. Extend your fingers out to their original position, all pointing straight up again.  Making Os Repeat this exercise 5-10 times with each hand. 1. Stand or sit with your arm, hand, and all five fingers pointed straight up. Make sure your wrist is straight. 2. Make an O shape by touching your pointer finger to your thumb. Hold for a few seconds. Then open your hand wide. 3. Repeat this motion with each finger on your hand.  Table Spread Repeat this exercise 5-10 times with each hand. 1. Place your hand on a table with your palm facing down. Make sure your wrist is straight. 2. Spread your fingers out as much as possible. Hold this position for a few seconds. 3. Slide your fingers back together again. Hold for a few seconds.  Ball Grip  Repeat this exercise 10-15 times with each hand. 1. Hold a tennis ball or another soft ball in your hand. 2. While slowly increasing pressure, squeeze the ball  as hard as possible. 3. Squeeze as hard as you can for 3-5 seconds. 4. Relax and repeat.  Wrist Curls Repeat this exercise 10-15 times with each hand. 1. Sit in a chair that has armrests. 2. Hold a light weight in your hand, such as a dumbbell that weighs 1-3 pounds (0.5-1.4 kg). Ask your health care provider what weight would be best for you. 3. Rest your hand just over the end of the chair arm with your palm facing up. 4. Gently pivot your wrist up and down while holding the weight. Do not  twist your wrist from side to side.  Contact a health care provider if:  Your hand pain or discomfort gets much worse when you do an exercise.  Your hand pain or discomfort does not improve within 2 hours after you exercise. If you have any of these problems, stop doing these exercises right away. Do not do them again unless your health care provider says that you can. Get help right away if:  You develop sudden, severe hand pain. If this happens, stop doing these exercises right away. Do not do them again unless your health care provider says that you can. This information is not intended to replace advice given to you by your health care provider. Make sure you discuss any questions you have with your health care provider. Document Released: 03/31/2015 Document Revised: 09/25/2015 Document Reviewed: 10/28/2014 Elsevier Interactive Patient Education  [employer].

## 2017-07-19 LAB — SJOGRENS SYNDROME-A EXTRACTABLE NUCLEAR ANTIBODY: SSA (RO) (ENA) ANTIBODY, IGG: NEGATIVE AI

## 2017-07-19 LAB — TSH: TSH: 1.29 m[IU]/L (ref 0.40–4.50)

## 2017-07-19 LAB — URIC ACID: Uric Acid, Serum: 5.3 mg/dL (ref 4.0–8.0)

## 2017-07-19 LAB — C3 AND C4
C3 COMPLEMENT: 138 mg/dL (ref 82–185)
C4 COMPLEMENT: 39 mg/dL (ref 15–53)

## 2017-07-19 LAB — RNP ANTIBODY: Ribonucleic Protein(ENA) Antibody, IgG: <1 AI

## 2017-07-19 LAB — ANTI-DNA ANTIBODY, DOUBLE-STRANDED

## 2017-07-19 LAB — CYCLIC CITRUL PEPTIDE ANTIBODY, IGG: Cyclic Citrullin Peptide Ab: <16 UNITS

## 2017-07-19 LAB — ANTI-SCLERODERMA ANTIBODY: Scleroderma (Scl-70) (ENA) Antibody, IgG: <1 AI

## 2017-07-19 LAB — SJOGRENS SYNDROME-B EXTRACTABLE NUCLEAR ANTIBODY: SSB (La) (ENA) Antibody, IgG: <1 AI

## 2017-07-19 LAB — ANTI-SMITH ANTIBODY: ENA SM Ab Ser-aCnc: <1 AI

## 2017-07-26 DIAGNOSIS — R030 Elevated blood-pressure reading, without diagnosis of hypertension: Secondary | ICD-10-CM | POA: Diagnosis not present

## 2017-07-26 DIAGNOSIS — M5126 Other intervertebral disc displacement, lumbar region: Secondary | ICD-10-CM | POA: Diagnosis not present

## 2017-07-26 DIAGNOSIS — Z6827 Body mass index (BMI) 27.0-27.9, adult: Secondary | ICD-10-CM | POA: Diagnosis not present

## 2017-07-28 ENCOUNTER — Ambulatory Visit: Payer: Self-pay | Admitting: Rheumatology

## 2017-08-17 ENCOUNTER — Ambulatory Visit (INDEPENDENT_AMBULATORY_CARE_PROVIDER_SITE_OTHER): Payer: 59 | Admitting: Rheumatology

## 2017-08-17 ENCOUNTER — Ambulatory Visit (INDEPENDENT_AMBULATORY_CARE_PROVIDER_SITE_OTHER): Payer: Self-pay

## 2017-08-17 DIAGNOSIS — M79641 Pain in right hand: Secondary | ICD-10-CM

## 2017-08-17 DIAGNOSIS — M79642 Pain in left hand: Secondary | ICD-10-CM

## 2017-08-17 NOTE — Progress Notes (Signed)
Ultrasound examination of bilateral hands was performed per EULAR recommendations. Using 12 MHz transducer, grayscale and power Doppler bilateral second, third, and fifth MCP joints and bilateral wrist joints both dorsal and volar aspects were evaluated to look for synovitis or tenosynovitis. The findings were there was no synovitis or tenosynovitis on ultrasound examination. Right median nerve was 0.11 cm squares which was within normal limits and left median nerve was 0.12 cm squares which was normal limits.  Impression: Ultrasound examination did not show any synovitis.  Bilateral median nerves were within normal limits. Bo Merino, MD

## 2017-08-18 DIAGNOSIS — Z8639 Personal history of other endocrine, nutritional and metabolic disease: Secondary | ICD-10-CM | POA: Insufficient documentation

## 2017-08-18 DIAGNOSIS — M19042 Primary osteoarthritis, left hand: Secondary | ICD-10-CM

## 2017-08-18 DIAGNOSIS — Z8719 Personal history of other diseases of the digestive system: Secondary | ICD-10-CM | POA: Insufficient documentation

## 2017-08-18 DIAGNOSIS — M19071 Primary osteoarthritis, right ankle and foot: Secondary | ICD-10-CM | POA: Insufficient documentation

## 2017-08-18 DIAGNOSIS — M19072 Primary osteoarthritis, left ankle and foot: Secondary | ICD-10-CM

## 2017-08-18 DIAGNOSIS — M5136 Other intervertebral disc degeneration, lumbar region: Secondary | ICD-10-CM | POA: Insufficient documentation

## 2017-08-18 DIAGNOSIS — M19041 Primary osteoarthritis, right hand: Secondary | ICD-10-CM | POA: Insufficient documentation

## 2017-08-18 NOTE — Progress Notes (Deleted)
Office Visit Note  Patient: Steven Torres             Date of Birth: June 15, 1960           MRN: [id number]             PCP: Midge Minium, MD Referring: Midge Minium, MD Visit Date: 08/25/2017 Occupation: @GUAROCC @    Subjective:  No chief complaint on file.   History of Present Illness: Steven Torres is a 57 y.o. male ***   Activities of Daily Living:  Patient reports morning stiffness for *** {minute/hour:19697}.   Patient {ACTIONS;DENIES/REPORTS:21021675::"Denies"} nocturnal pain.  Difficulty dressing/grooming: {ACTIONS;DENIES/REPORTS:21021675::"Denies"} Difficulty climbing stairs: {ACTIONS;DENIES/REPORTS:21021675::"Denies"} Difficulty getting out of chair: {ACTIONS;DENIES/REPORTS:21021675::"Denies"} Difficulty using hands for taps, buttons, cutlery, and/or writing: {ACTIONS;DENIES/REPORTS:21021675::"Denies"}   No Rheumatology ROS completed.   PMFS History:  Patient Active Problem List   Diagnosis Date Noted  . Right lumbar radiculitis 06/10/2017  . Special screening for malignant neoplasms, colon 06/02/2016  . Anxiety state 04/14/2016  . Seasonal allergies 09/15/2015  . Dyslipidemia 04/13/2015  . IFG (impaired fasting glucose) 04/13/2015  . Well adult 03/31/2015  . Left sided colitis ([place]) 03/13/2012    Past Medical History:  Diagnosis Date  . Anxiety   . Chronic kidney disease   . GERD (gastroesophageal reflux disease)   . History of kidney stones   . Hyperlipidemia   . Renal stone   . Ulcerative colitis ([place])     Family History  Problem Relation Age of Onset  . Prostate cancer Father   . Colon polyps Mother        benign  . Heart attack Mother   . Colon cancer Neg Hx   . Rectal cancer Neg Hx   . Stomach cancer Neg Hx    Past Surgical History:  Procedure Laterality Date  . BACK SURGERY    . EXTRACORPOREAL SHOCK WAVE LITHOTRIPSY Left 11/29/2016   Procedure: LEFT EXTRACORPOREAL SHOCK WAVE LITHOTRIPSY (ESWL);  Surgeon: Raynelle Bring, MD;   Location: [facility];  Service: Urology;  Laterality: Left;  . HERNIA REPAIR    . KNEE ARTHROSCOPY     2- both on right knee  . WISDOM TOOTH EXTRACTION     Social History   Social History Narrative  . Not on file     Objective: Vital Signs: There were no vitals taken for this visit.   Physical Exam   Musculoskeletal Exam: ***  CDAI Exam: No CDAI exam completed.    Investigation: No additional findings.  May 06, 2017 CBC normal, CMP normal, ANA 1: 80 homogeneous, RF negative 07/18/2017 ENA negative, C3-C4 normal, anti-CCP normal, uric acid 5.3, TSH normal  Imaging: [place] Extrem Up Bilat Comp  Result Date: 08/17/2017 Ultrasound examination of bilateral hands was performed per EULAR recommendations. Using 12 MHz transducer, grayscale and power Doppler bilateral second, third, and fifth MCP joints and bilateral wrist joints both dorsal and volar aspects were evaluated to look for synovitis or tenosynovitis. The findings were there was no synovitis or tenosynovitis on ultrasound examination. Right median nerve was 0.11 cm squares which was within normal limits and left median nerve was 0.12 cm squares which was normal limits. Impression: Ultrasound examination did not show any synovitis.  Bilateral median nerves were within normal limits.   Speciality Comments: No specialty comments available.    Procedures:  No procedures performed Allergies: Patient has no known allergies.   Assessment / Plan:     Visit Diagnoses: No diagnosis found.  Orders: No orders of the defined types were placed in this encounter.  No orders of the defined types were placed in this encounter.   Face-to-face time spent with patient was *** minutes. 50% of time was spent in counseling and coordination of care.  Follow-Up Instructions: No follow-ups on file.   Bo Merino, MD  Note - This record has been created using Editor, commissioning.  Chart creation errors have been sought, but may  not always  have been located. Such creation errors do not reflect on  the standard of medical care.

## 2017-08-22 ENCOUNTER — Ambulatory Visit
Admission: RE | Admit: 2017-08-22 | Discharge: 2017-08-22 | Disposition: A | Discharge status: Home / Self Care | Payer: 59 | Source: Ambulatory Visit | Attending: Sports Medicine | Admitting: Sports Medicine

## 2017-08-22 DIAGNOSIS — M4727 Other spondylosis with radiculopathy, lumbosacral region: Secondary | ICD-10-CM | POA: Diagnosis not present

## 2017-08-22 IMAGING — XA DG EPIDURAL/NERVE ROOT
2 series · 2 of 2 positions shown · non-contrast
Comparison: none

CLINICAL DATA: Lumbosacral spondylosis without myelopathy with
radiculopathy. Right buttock and right lower extremity pain. Mild
improvement following the right S1 nerve root block 2 months ago.
Right-sided L5-S1 transforaminal injection requested today.

[Series 4: ortho adipose · 1 of 1 slices shown (1 of 2)]
[im 1/1]
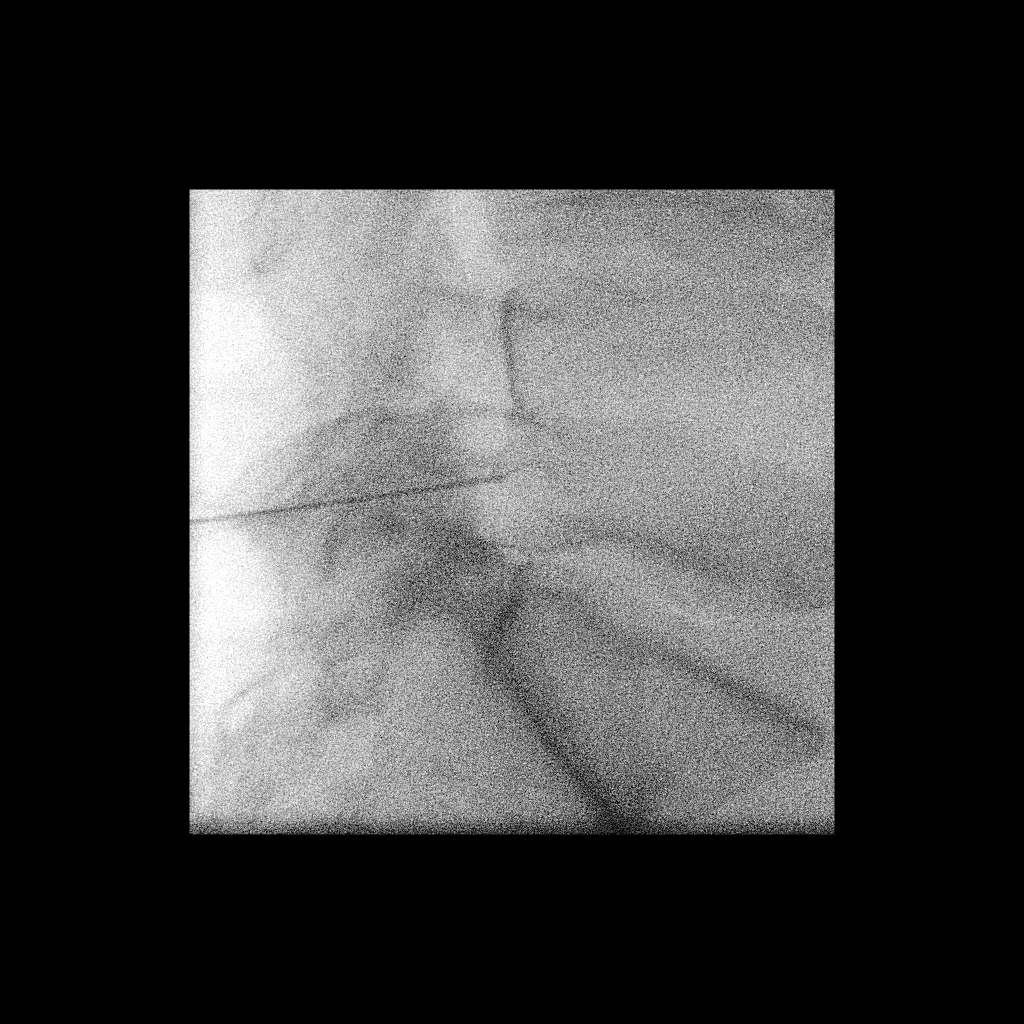

[Series 5: ortho adipose · 1 of 1 slices shown (2 of 2)]
[im 1/1]
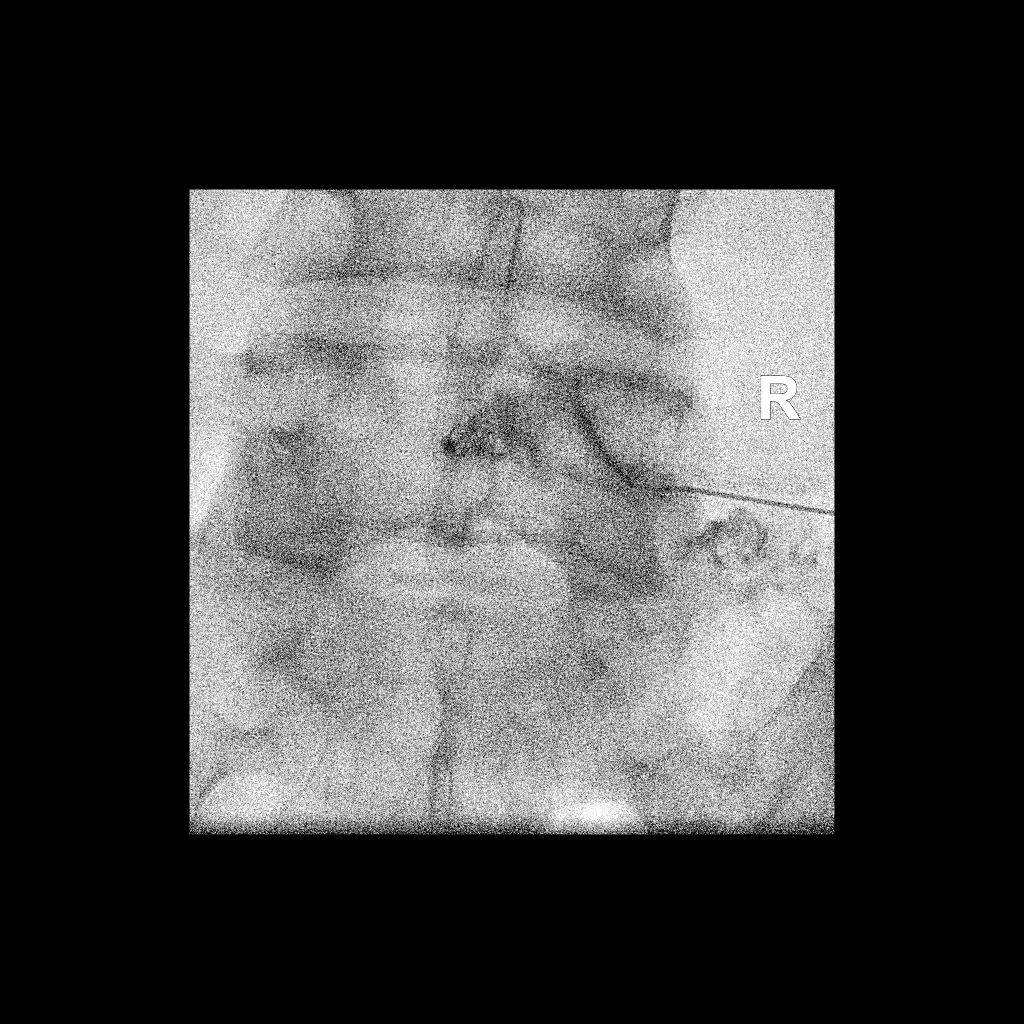

[2 of 2 positions shown; findings below may reference images not displayed]

EXAM:
EPIDURAL/NERVE ROOT

FLUOROSCOPY TIME:  Radiation Exposure Index (as provided by the
fluoroscopic device): 92.82 microGray*m^2

Fluoroscopy Time (in minutes and seconds):  43 seconds

PROCEDURE:
The procedure, risks, benefits, and alternatives were explained to
the patient. Questions regarding the procedure were encouraged and
answered. The patient understands and consents to the procedure.

RIGHT L5 NERVE ROOT BLOCK AND TRANSFORAMINAL EPIDURAL: A posterior
oblique approach was taken to the intervertebral foramen on the
right at L5-S1 using a curved 3.5 inch 22 gauge spinal needle.
Injection of Isovue-M 200 outlined the right L5 nerve root and
showed good epidural spread centrally into the spinal canal at the
L5 level. No vascular opacification is seen. 120 mg of Depo-Medrol
mixed with 2 mL of 1% lidocaine were instilled. The procedure was
well-tolerated, and the patient was discharged thirty minutes
following the injection in good condition. He denied any residual
pain at the time of discharge.

COMPLICATIONS:
None
IMPRESSION: Technically successful injection consisting of a right L5 nerve root
block and transforaminal epidural.

## 2017-08-22 MED ORDER — IOPAMIDOL (ISOVUE-M 200) INJECTION 41%
1.0000 mL | Freq: Once | INTRAMUSCULAR | Status: AC
  Administered 2017-08-22: 1 mL via EPIDURAL

## 2017-08-22 MED ORDER — METHYLPREDNISOLONE ACETATE 40 MG/ML INJ SUSP (RADIOLOG
120.0000 mg | Freq: Once | INTRAMUSCULAR | Status: AC
  Administered 2017-08-22: 120 mg via EPIDURAL

## 2017-08-25 ENCOUNTER — Ambulatory Visit: Payer: Self-pay | Admitting: Rheumatology

## 2017-08-29 ENCOUNTER — Ambulatory Visit: Payer: 59 | Admitting: Gastroenterology

## 2017-08-29 ENCOUNTER — Encounter: Payer: Self-pay | Admitting: Gastroenterology

## 2017-08-29 VITALS — BP 130/82 | HR 74 | Ht 73.0 in | Wt 205.2 lb

## 2017-08-29 DIAGNOSIS — K5150 Left sided colitis without complications: Secondary | ICD-10-CM | POA: Diagnosis not present

## 2017-08-29 NOTE — Patient Instructions (Addendum)
If you are age 57 or older, your body mass index should be between 23-30. Your Body mass index is 27.08 kg/m. If this is out of the aforementioned range listed, please consider follow up with your Primary Care Provider.  If you are age 54 or younger, your body mass index should be between 19-25. Your Body mass index is 27.08 kg/m. If this is out of the aformentioned range listed, please consider follow up with your Primary Care Provider.   You will be due for labs in July. We will contact you when it gets closer and remind you.  Please follow up with [place] in the office in 1 year.  Thank you for entrusting me with your care and for choosing [facility], Dr. [place] Cellar

## 2017-08-29 NOTE — Progress Notes (Signed)
HPI :  57 y/o male with a history of left sided ulcerative colitis, here for a follow up visit. He has a history of left sided colitis diagnosed in 2013-2014 or so. No prior hospitalization. He had been taking Lialda historically, but had been off it since roughly 2015 or so and resumed after his last colonoscopy in Jan 2018 as below.   Since last visit he has continued Lialda 2.4 g per day. He denies any diarrhea or urgency with his stools. He states he has roughly 3 formed bowel movement per day. He denies any abdominal pains. No weight loss. States he is eating well without complaints. He does have some arthritis in his knees, no other arthralgias. He denies any family history of colon cancer. He has not needed to use Canasa at all. He denies any NSAID use currently. He was taking ibuprofen at the time of the colonoscopy previously, few times per week.   Colonoscopy 06/02/16 - isolated ileal ulceration, mild to moderate inflammation in the rectum / sigmoid colon, with cecal patch. 47m polyp, diverticulosis. Path with chronic active inflammation. Polyp was adenoma   Past Medical History:  Diagnosis Date  . Anxiety   . Chronic kidney disease   . GERD (gastroesophageal reflux disease)   . History of kidney stones   . Hyperlipidemia   . Renal stone   . Ulcerative colitis ([facility]      Past Surgical History:  Procedure Laterality Date  . BACK SURGERY    . EXTRACORPOREAL SHOCK WAVE LITHOTRIPSY Left 11/29/2016   Procedure: LEFT EXTRACORPOREAL SHOCK WAVE LITHOTRIPSY (ESWL);  Surgeon: BRaynelle Bring MD;  Location: [facility];  Service: Urology;  Laterality: Left;  . HERNIA REPAIR    . KNEE ARTHROSCOPY     2- both on right knee  . WISDOM TOOTH EXTRACTION     Family History  Problem Relation Age of Onset  . Prostate cancer Father   . Colon polyps Mother        benign  . Heart attack Mother   . Colon cancer Neg Hx   . Rectal cancer Neg Hx   . Stomach cancer Neg Hx    Social History    Tobacco Use  . Smoking status: Never Smoker  . Smokeless tobacco: Never Used  Substance Use Topics  . Alcohol use: No  . Drug use: No   Current Outpatient Medications  Medication Sig Dispense Refill  . aspirin 81 MG tablet Take 81 mg by mouth daily.    . Cholecalciferol (VITAMIN D) 2000 UNITS CAPS Take 1 capsule by mouth daily.    .Marland KitchenGLUCOSAMINE-CHONDROITIN PO Take by mouth daily.    . mesalamine (LIALDA) 1.2 g EC tablet Take 2 tablets (2.4 g total) by mouth daily with breakfast. 180 tablet 3  . Multiple Vitamins-Minerals (MULTIVITAMIN ADULT PO) Take by mouth daily.     No current facility-administered medications for this visit.    No Known Allergies   Review of Systems: All systems reviewed and negative except where noted in HPI.    [place] Bil[place] Comp  Result Date: 08/17/2017 Ultrasound examination of bilateral hands was performed per EULAR recommendations. Using 12 MHz transducer, grayscale and power Doppler bilateral second, third, and fifth MCP joints and bilateral wrist joints both dorsal and volar aspects were evaluated to look for synovitis or tenosynovitis. The findings were there was no synovitis or tenosynovitis on ultrasound examination. Right median nerve was 0.11 cm squares which was within normal limits and  left median nerve was 0.12 cm squares which was normal limits. Impression: Ultrasound examination did not show any synovitis.  Bilateral median nerves were within normal limits.  Dg Epidural/nerve Root  Result Date: 08/22/2017 CLINICAL DATA:  Lumbosacral spondylosis without myelopathy with radiculopathy. Right buttock and right lower extremity pain. Mild improvement following the right S1 nerve root block 2 months ago. Right-sided L5-S1 transforaminal injection requested today. EXAM: EPIDURAL/NERVE ROOT FLUOROSCOPY TIME:  Radiation Exposure Index (as provided by the fluoroscopic device): 92.82 microGray*m^2 Fluoroscopy Time (in minutes and seconds):  43 seconds  PROCEDURE: The procedure, risks, benefits, and alternatives were explained to the patient. Questions regarding the procedure were encouraged and answered. The patient understands and consents to the procedure. RIGHT L5 NERVE ROOT BLOCK AND TRANSFORAMINAL EPIDURAL: A posterior oblique approach was taken to the intervertebral foramen on the right at L5-S1 using a curved 3.5 inch 22 gauge spinal needle. Injection of Isovue-M 200 outlined the right L5 nerve root and showed good epidural spread centrally into the spinal canal at the L5 level. No vascular opacification is seen. 120 mg of Depo-Medrol mixed with 2 mL of 1% lidocaine were instilled. The procedure was well-tolerated, and the patient was discharged thirty minutes following the injection in good condition. He denied any residual pain at the time of discharge. COMPLICATIONS: None IMPRESSION: Technically successful injection consisting of a right L5 nerve root block and transforaminal epidural. Electronically Signed   By: Logan Bores M.D.   On: 08/22/2017 14:37   Lab Results  Component Value Date   WBC 6.6 05/06/2017   HGB 15.3 05/06/2017   HCT 45.0 05/06/2017   MCV 94.5 05/06/2017   PLT 350.0 05/06/2017    Lab Results  Component Value Date   CREATININE 1.06 05/06/2017   BUN 16 05/06/2017   NA 141 05/06/2017   K 4.4 05/06/2017   CL 102 05/06/2017   CO2 29 05/06/2017    Lab Results  Component Value Date   ALT 28 05/06/2017   AST 19 05/06/2017   ALKPHOS 96 05/06/2017   BILITOT 0.9 05/06/2017     Physical Exam: BP 130/82   Pulse 74   Ht 6' 1"  (1.854 m)   Wt 205 lb 4 oz (93.1 kg)   BMI 27.08 kg/m  Constitutional: Pleasant,well-developed, male in no acute distress. HEENT: Normocephalic and atraumatic. Conjunctivae are normal. No scleral icterus. Neck supple.  Cardiovascular: Normal rate, regular rhythm.  Pulmonary/chest: Effort normal and breath sounds normal. No wheezing, rales or rhonchi. Abdominal: Soft, nondistended,  nontender.There are no masses palpable. No hepatomegaly. Extremities: no edema Lymphadenopathy: No cervical adenopathy noted. Neurological: Alert and oriented to person place and time. Skin: Skin is warm and dry. No rashes noted. Psychiatric: Normal mood and affect. Behavior is normal.   ASSESSMENT AND PLAN: 57 year old male with suspected left-sided ulcerative colitis here for follow-up. Last colonoscopy January 2018 while OFF all therapy, showed active colitis in the left colon as well as a small cecal patch and one small aphthous ulcerations ileum. Biopsies of the small bowel showed chronic active inflammation which raised the possibility for Crohn's disease. At the time of that exam he did endorse using periodic NSAID use which could've caused that finding. We also discussed the possibility that he has small bowel Crohn's disease. At the last visit he declined further evaluation with enterography study and capsule study given he felt well without symptoms.  Over the past year while on Lialda monotherapy is feeling quite well without any complaints. His  labs show no evidence of anemia or abnormalities. He is tolerating the medicine without problems. I suspect he more than likely has left-sided colitis, now treated with Lialda, while Crohn's disease with mild small bowel involvement remains possible. We discussed whether or not to further evaluate his small bowel at this time. Given he feels well without symptoms and his labs are normal, even if we did find active mild small bowel inflammation we probably would hold off on chronic therapy for this following discussion of what some of those medications can entail.  For now we'll continue Lialda monotherapy. We will let on repeating his labs in July. I do think repeating a colonoscopy while on Lialda to ensure he is in endoscopic remission is reasonable to be done within the next year. He is agreeable to that.should he develop abdominal pain or  worsening bowel function in the interim, he should contact me for reassessment.  Garber Cellar, MD [facility]

## 2017-08-30 NOTE — Progress Notes (Deleted)
Office Visit Note  Patient: Steven Torres             Date of Birth: 1960/05/28           MRN: [id number]             PCP: Midge Minium, MD Referring: Midge Minium, MD Visit Date: 09/08/2017 Occupation: @GUAROCC @    Subjective:  No chief complaint on file.   History of Present Illness: Steven Torres is a 57 y.o. male ***   Activities of Daily Living:  Patient reports morning stiffness for *** {minute/hour:19697}.   Patient {ACTIONS;DENIES/REPORTS:21021675::"Denies"} nocturnal pain.  Difficulty dressing/grooming: {ACTIONS;DENIES/REPORTS:21021675::"Denies"} Difficulty climbing stairs: {ACTIONS;DENIES/REPORTS:21021675::"Denies"} Difficulty getting out of chair: {ACTIONS;DENIES/REPORTS:21021675::"Denies"} Difficulty using hands for taps, buttons, cutlery, and/or writing: {ACTIONS;DENIES/REPORTS:21021675::"Denies"}   No Rheumatology ROS completed.   PMFS History:  Patient Active Problem List   Diagnosis Date Noted  . Primary osteoarthritis of both hands 08/18/2017  . Primary osteoarthritis of both feet 08/18/2017  . History of ulcerative colitis 08/18/2017  . DDD (degenerative disc disease), lumbar 08/18/2017  . History of hyperlipidemia 08/18/2017  . History of gastroesophageal reflux (GERD) 08/18/2017  . Right lumbar radiculitis 06/10/2017  . Special screening for malignant neoplasms, colon 06/02/2016  . Anxiety state 04/14/2016  . Seasonal allergies 09/15/2015  . Dyslipidemia 04/13/2015  . IFG (impaired fasting glucose) 04/13/2015  . Well adult 03/31/2015  . Left sided colitis ([place]) 03/13/2012    Past Medical History:  Diagnosis Date  . Anxiety   . Chronic kidney disease   . GERD (gastroesophageal reflux disease)   . History of kidney stones   . Hyperlipidemia   . Renal stone   . Ulcerative colitis ([place])     Family History  Problem Relation Age of Onset  . Prostate cancer Father   . Colon polyps Mother        benign  . Heart attack Mother     . Colon cancer Neg Hx   . Rectal cancer Neg Hx   . Stomach cancer Neg Hx    Past Surgical History:  Procedure Laterality Date  . BACK SURGERY    . EXTRACORPOREAL SHOCK WAVE LITHOTRIPSY Left 11/29/2016   Procedure: LEFT EXTRACORPOREAL SHOCK WAVE LITHOTRIPSY (ESWL);  Surgeon: Raynelle Bring, MD;  Location: [facility];  Service: Urology;  Laterality: Left;  . HERNIA REPAIR    . KNEE ARTHROSCOPY     2- both on right knee  . WISDOM TOOTH EXTRACTION     Social History   Social History Narrative  . Not on file     Objective: Vital Signs: There were no vitals taken for this visit.   Physical Exam   Musculoskeletal Exam: ***  CDAI Exam: No CDAI exam completed.    Investigation: No additional findings. July 18, 2017 C3-C4 normal, ENA negative, anti-CCP negative, uric acid 5.3, TSH normal January 2019 ANA 1: 80 homogeneous, RF negative  Imaging: [place] Extrem Up Bilat Comp  Result Date: 08/17/2017 Ultrasound examination of bilateral hands was performed per EULAR recommendations. Using 12 MHz transducer, grayscale and power Doppler bilateral second, third, and fifth MCP joints and bilateral wrist joints both dorsal and volar aspects were evaluated to look for synovitis or tenosynovitis. The findings were there was no synovitis or tenosynovitis on ultrasound examination. Right median nerve was 0.11 cm squares which was within normal limits and left median nerve was 0.12 cm squares which was normal limits. Impression: Ultrasound examination did not show any synovitis.  Bilateral median  nerves were within normal limits.  Dg Epidural/nerve Root  Result Date: 08/22/2017 CLINICAL DATA:  Lumbosacral spondylosis without myelopathy with radiculopathy. Right buttock and right lower extremity pain. Mild improvement following the right S1 nerve root block 2 months ago. Right-sided L5-S1 transforaminal injection requested today. EXAM: EPIDURAL/NERVE ROOT FLUOROSCOPY TIME:  Radiation Exposure Index (as  provided by the fluoroscopic device): 92.82 microGray*m^2 Fluoroscopy Time (in minutes and seconds):  43 seconds PROCEDURE: The procedure, risks, benefits, and alternatives were explained to the patient. Questions regarding the procedure were encouraged and answered. The patient understands and consents to the procedure. RIGHT L5 NERVE ROOT BLOCK AND TRANSFORAMINAL EPIDURAL: A posterior oblique approach was taken to the intervertebral foramen on the right at L5-S1 using a curved 3.5 inch 22 gauge spinal needle. Injection of Isovue-M 200 outlined the right L5 nerve root and showed good epidural spread centrally into the spinal canal at the L5 level. No vascular opacification is seen. 120 mg of Depo-Medrol mixed with 2 mL of 1% lidocaine were instilled. The procedure was well-tolerated, and the patient was discharged thirty minutes following the injection in good condition. He denied any residual pain at the time of discharge. COMPLICATIONS: None IMPRESSION: Technically successful injection consisting of a right L5 nerve root block and transforaminal epidural. Electronically Signed   By: Logan Bores M.D.   On: 08/22/2017 14:37    Speciality Comments: No specialty comments available.    Procedures:  No procedures performed Allergies: Patient has no known allergies.   Assessment / Plan:     Visit Diagnoses: Primary osteoarthritis of both hands - All autoimmune work-up negative except ANA 1: 80 homogeneous  Primary osteoarthritis of both feet  History of ulcerative colitis - Lialda 2.4 g daily, TTd by Dr. Havery Moros.  DDD (degenerative disc disease), lumbar - L5-S1 disc herniation  Dyslipidemia  Anxiety state  History of gastroesophageal reflux (GERD)    Orders: No orders of the defined types were placed in this encounter.  No orders of the defined types were placed in this encounter.   Face-to-face time spent with patient was *** minutes. 50% of time was spent in counseling and  coordination of care.  Follow-Up Instructions: No follow-ups on file.   Bo Merino, MD  Note - This record has been created using Editor, commissioning.  Chart creation errors have been sought, but may not always  have been located. Such creation errors do not reflect on  the standard of medical care.

## 2017-09-08 ENCOUNTER — Ambulatory Visit: Payer: Self-pay | Admitting: Rheumatology

## 2017-09-22 ENCOUNTER — Ambulatory Visit (INDEPENDENT_AMBULATORY_CARE_PROVIDER_SITE_OTHER): Payer: 59 | Admitting: Sports Medicine

## 2017-09-22 ENCOUNTER — Encounter: Payer: Self-pay | Admitting: Sports Medicine

## 2017-09-22 DIAGNOSIS — M5416 Radiculopathy, lumbar region: Secondary | ICD-10-CM | POA: Diagnosis not present

## 2017-09-22 NOTE — Progress Notes (Signed)
Subjective:    CC: Follow-up after epidural #2  HPI: Steven Torres is a very pleasant 57 year old male, post L4-L5 microdiscectomy, with L5-S1 degenerative disc disease and right L5 radiculitis, did not respond initially to an S1 epidural, subsequently an L5-S1 transforaminal epidural provided fantastic relief, he did discuss the situation with Dr. Christella Noa with [place] [facility] who suggested continued conservative care, happy with how things are going and eager to proceed with epidural #3.  No bowel or bladder dysfunction, saddle numbness, constitutional symptoms, pain is mild, improving.  I reviewed the past medical history, family history, social history, surgical history, and allergies today and no changes were needed.  Please see the problem list section below in epic for further details.  Past Medical History: Past Medical History:  Diagnosis Date  . Anxiety   . Chronic kidney disease   . GERD (gastroesophageal reflux disease)   . History of kidney stones   . Hyperlipidemia   . Renal stone   . Ulcerative colitis [facility])    Past Surgical History: Past Surgical History:  Procedure Laterality Date  . BACK SURGERY    . EXTRACORPOREAL SHOCK WAVE LITHOTRIPSY Left 11/29/2016   Procedure: LEFT EXTRACORPOREAL SHOCK WAVE LITHOTRIPSY (ESWL);  Surgeon: Raynelle Bring, MD;  Location: [facility];  Service: Urology;  Laterality: Left;  . HERNIA REPAIR    . KNEE ARTHROSCOPY     2- both on right knee  . WISDOM TOOTH EXTRACTION     Social History: Social History   Socioeconomic History  . Marital status: Married    Spouse name: Not on file  . Number of children: 2  . Years of education: Not on file  . Highest education level: Not on file  Occupational History  . Occupation: Passenger transport manager  Social Needs  . Financial resource strain: Not on file  . Food insecurity:    Worry: Not on file    Inability: Not on file  . Transportation needs:    Medical: Not on file    Non-medical: Not on file    Tobacco Use  . Smoking status: Never Smoker  . Smokeless tobacco: Never Used  Substance and Sexual Activity  . Alcohol use: No  . Drug use: No  . Sexual activity: Not on file  Lifestyle  . Physical activity:    Days per week: Not on file    Minutes per session: Not on file  . Stress: Not on file  Relationships  . Social connections:    Talks on phone: Not on file    Gets together: Not on file    Attends religious service: Not on file    Active member of club or organization: Not on file    Attends meetings of clubs or organizations: Not on file    Relationship status: Not on file  Other Topics Concern  . Not on file  Social History Narrative  . Not on file   Family History: Family History  Problem Relation Age of Onset  . Prostate cancer Father   . Colon polyps Mother        benign  . Heart attack Mother   . Colon cancer Neg Hx   . Rectal cancer Neg Hx   . Stomach cancer Neg Hx    Allergies: No Known Allergies Medications: See med rec.  Review of Systems: No fevers, chills, night sweats, weight loss, chest pain, or shortness of breath.   Objective:    General: Well Developed, well nourished, and in no acute distress.  Neuro: Alert and oriented x3, extra-ocular muscles intact, sensation grossly intact.  HEENT: Normocephalic, atraumatic, pupils equal round reactive to light, neck supple, no masses, no lymphadenopathy, thyroid nonpalpable.  Skin: Warm and dry, no rashes. Cardiac: Regular rate and rhythm, no murmurs rubs or gallops, no lower extremity edema.  Respiratory: Clear to auscultation bilaterally. Not using accessory muscles, speaking in full sentences.  Impression and Recommendations:    Right lumbar radiculitis Minimal response to right selective S1 epidural, subsequent epidural was transforaminal and L5-S1 on the right and provided fantastic relief. I am going to proceed with epidural #3 but certainly at the right L5-S1 transforaminal. He did talk to  Dr. Christella Noa with [place] [facility] who suggested continued conservative care. He is also going to think about gabapentin to use at bedtime. Return to see me 1 month after his next epidural. ___________________________________________ Gwen Her. Dianah Field, M.D., ABFM., CAQSM. Primary Care and [place] [employer] of [place] [facility] [employer]

## 2017-09-22 NOTE — Assessment & Plan Note (Signed)
Minimal response to right selective S1 epidural, subsequent epidural was transforaminal and L5-S1 on the right and provided fantastic relief. I am going to proceed with epidural #3 but certainly at the right L5-S1 transforaminal. He did talk to Dr. Christella Noa with [place] [facility] who suggested continued conservative care. He is also going to think about gabapentin to use at bedtime. Return to see me 1 month after his next epidural.

## 2017-09-27 ENCOUNTER — Other Ambulatory Visit: Payer: Self-pay | Admitting: Gastroenterology

## 2017-09-27 DIAGNOSIS — K51311 Ulcerative (chronic) rectosigmoiditis with rectal bleeding: Secondary | ICD-10-CM

## 2017-09-27 MED FILL — LIALDA 1.2 GM TABLET SA: 1.2 | 90 days supply | Qty: 180 | Fill #0

## 2017-10-07 ENCOUNTER — Other Ambulatory Visit: Payer: 59

## 2017-10-13 ENCOUNTER — Ambulatory Visit
Admission: RE | Admit: 2017-10-13 | Discharge: 2017-10-13 | Disposition: A | Discharge status: Home / Self Care | Payer: 59 | Source: Ambulatory Visit | Attending: Sports Medicine | Admitting: Sports Medicine

## 2017-10-13 DIAGNOSIS — M5106 Intervertebral disc disorders with myelopathy, lumbar region: Secondary | ICD-10-CM | POA: Diagnosis not present

## 2017-10-13 IMAGING — XA DG EPIDURAL/NERVE ROOT
1 series · 1 of 1 positions shown · non-contrast
Comparison: none

CLINICAL DATA: Symptomatic RIGHT L5 impingement.

[Series 1: ortho standard · 1 of 1 slices shown]
[im 1/1]
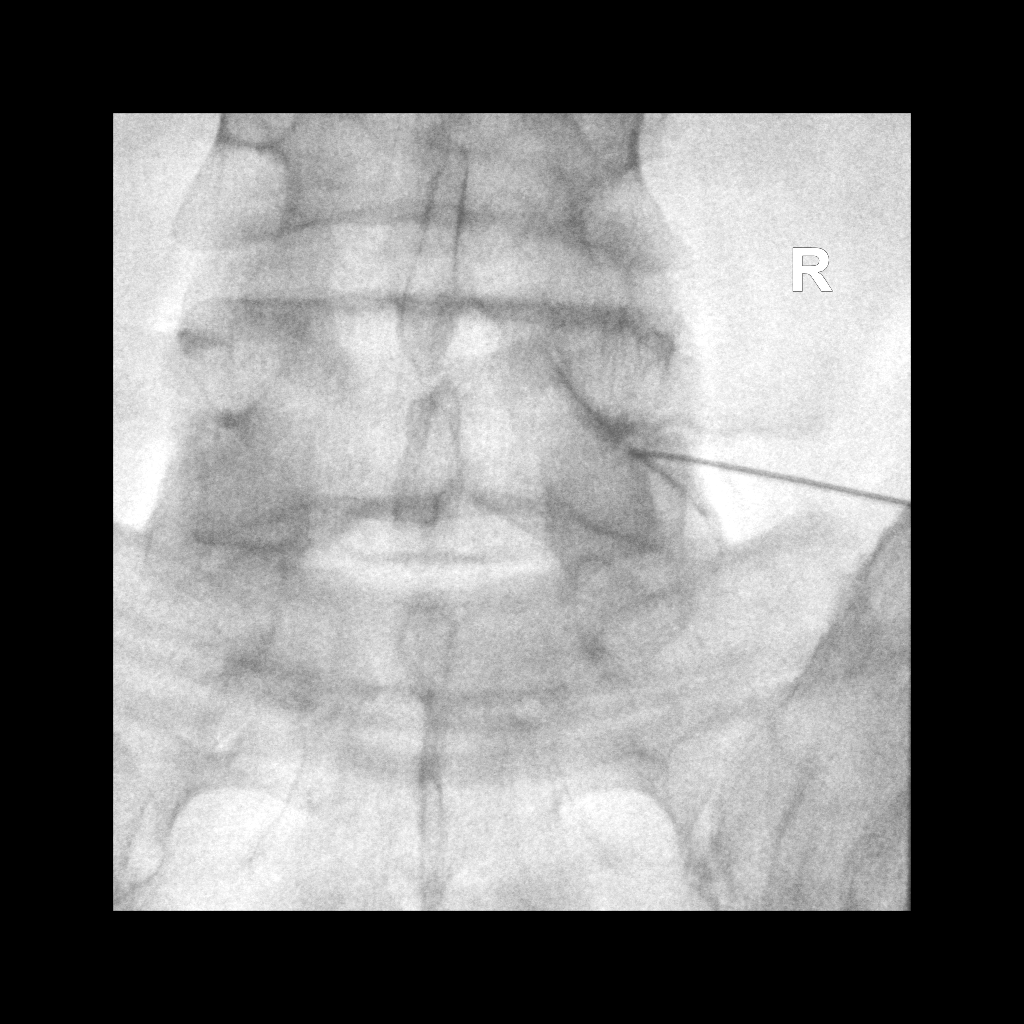

[1 of 1 positions shown; findings below may reference images not displayed]

EXAM:
EPIDURAL/NERVE ROOT

FLUOROSCOPY TIME:  14 seconds corresponding to a Dose Area Product
of 21.55 Gy*m2

PROCEDURE:
The procedure, risks, benefits, and alternatives were explained to
the patient. Questions regarding the procedure were encouraged and
answered. The patient understands and consents to the procedure.

RIGHT L5 NERVE ROOT BLOCK AND TRANSFORAMINAL EPIDURAL: A posterior
oblique approach was taken to the intervertebral foramen on the
RIGHT at L5-S1 using a curved 22 gauge spinal needle. Injection of
Isovue-M 200 outlined the RIGHT L5 nerve root and showed good
epidural spread. No vascular opacification is seen. 120.0 mg of
Depo-Medrol mixed with 2 mL 1% lidocaine were instilled. The
procedure was well-tolerated, and the patient was discharged thirty
minutes following the injection in good condition.

COMPLICATIONS:
None
IMPRESSION: Technically successful injection consisting of a RIGHT L5 nerve root
block and transforaminal epidural.

## 2017-10-13 MED ORDER — METHYLPREDNISOLONE ACETATE 40 MG/ML INJ SUSP (RADIOLOG
120.0000 mg | Freq: Once | INTRAMUSCULAR | Status: AC
  Administered 2017-10-13: 120 mg via EPIDURAL

## 2017-10-13 MED ORDER — IOPAMIDOL (ISOVUE-M 200) INJECTION 41%
1.0000 mL | Freq: Once | INTRAMUSCULAR | Status: AC
  Administered 2017-10-13: 1 mL via EPIDURAL

## 2017-10-24 ENCOUNTER — Other Ambulatory Visit: Payer: Self-pay

## 2017-10-24 ENCOUNTER — Encounter: Payer: Self-pay | Admitting: Family Medicine

## 2017-10-24 ENCOUNTER — Ambulatory Visit (INDEPENDENT_AMBULATORY_CARE_PROVIDER_SITE_OTHER): Payer: 59 | Admitting: Family Medicine

## 2017-10-24 VITALS — BP 123/81 | HR 74 | Temp 98.4°F | Resp 16 | Ht 73.0 in | Wt 199.0 lb

## 2017-10-24 DIAGNOSIS — Z125 Encounter for screening for malignant neoplasm of prostate: Secondary | ICD-10-CM

## 2017-10-24 DIAGNOSIS — E785 Hyperlipidemia, unspecified: Secondary | ICD-10-CM

## 2017-10-24 DIAGNOSIS — Z23 Encounter for immunization: Secondary | ICD-10-CM | POA: Diagnosis not present

## 2017-10-24 DIAGNOSIS — Z0000 Encounter for general adult medical examination without abnormal findings: Secondary | ICD-10-CM | POA: Diagnosis not present

## 2017-10-24 LAB — LIPID PANEL
Cholesterol: 214 mg/dL — ABNORMAL HIGH (ref 0–200)
HDL: 55.3 mg/dL (ref >39.00)
LDL Cholesterol: 137 mg/dL — ABNORMAL HIGH (ref 0–99)
NonHDL: 158.34
TRIGLYCERIDES: 105 mg/dL (ref 0.0–149.0)
Total CHOL/HDL Ratio: 4
VLDL: 21 mg/dL (ref 0.0–40.0)

## 2017-10-24 LAB — BASIC METABOLIC PANEL
BUN: 17 mg/dL (ref 6–23)
CO2: 27 mEq/L (ref 19–32)
CREATININE: 1.02 mg/dL (ref 0.40–1.50)
Calcium: 10 mg/dL (ref 8.4–10.5)
Chloride: 105 mEq/L (ref 96–112)
GFR: 80.03 mL/min (ref >60.00)
Glucose, Bld: 102 mg/dL — ABNORMAL HIGH (ref 70–99)
POTASSIUM: 4.1 meq/L (ref 3.5–5.1)
Sodium: 141 mEq/L (ref 135–145)

## 2017-10-24 LAB — HEPATIC FUNCTION PANEL
ALBUMIN: 5 g/dL (ref 3.5–5.2)
ALK PHOS: 121 U/L — AB (ref 39–117)
ALT: 24 U/L (ref 0–53)
AST: 18 U/L (ref 0–37)
Bilirubin, Direct: 0.1 mg/dL (ref 0.0–0.3)
Total Bilirubin: 0.7 mg/dL (ref 0.2–1.2)
Total Protein: 7.6 g/dL (ref 6.0–8.3)

## 2017-10-24 LAB — CBC WITH DIFFERENTIAL/PLATELET
Basophils Absolute: 0.1 10*3/uL (ref 0.0–0.1)
Basophils Relative: 0.7 % (ref 0.0–3.0)
EOS PCT: 2.1 % (ref 0.0–5.0)
Eosinophils Absolute: 0.2 10*3/uL (ref 0.0–0.7)
HEMATOCRIT: 43.3 % (ref 39.0–52.0)
HEMOGLOBIN: 15.4 g/dL (ref 13.0–17.0)
LYMPHS PCT: 18 % (ref 12.0–46.0)
Lymphs Abs: 1.5 10*3/uL (ref 0.7–4.0)
MCHC: 35.5 g/dL (ref 30.0–36.0)
MCV: 93.3 fl (ref 78.0–100.0)
Monocytes Absolute: 0.5 10*3/uL (ref 0.1–1.0)
Monocytes Relative: 5.8 % (ref 3.0–12.0)
Neutro Abs: 5.9 10*3/uL (ref 1.4–7.7)
Neutrophils Relative %: 73.4 % (ref 43.0–77.0)
Platelets: 384 10*3/uL (ref 150.0–400.0)
RBC: 4.64 Mil/uL (ref 4.22–5.81)
RDW: 12.4 % (ref 11.5–15.5)
WBC: 8.1 10*3/uL (ref 4.0–10.5)

## 2017-10-24 LAB — TSH: TSH: 1.31 u[IU]/mL (ref 0.35–4.50)

## 2017-10-24 LAB — PSA: PSA: 1.3 ng/mL (ref 0.10–4.00)

## 2017-10-24 NOTE — Assessment & Plan Note (Signed)
Chronic problem.  Attempting to control w/ diet and exercise.  Check labs.  Start meds prn.

## 2017-10-24 NOTE — Assessment & Plan Note (Signed)
Pt's PE WNL.  UTD on colonoscopy.  Tdap given.  Check labs.  Anticipatory guidance provided.

## 2017-10-24 NOTE — Addendum Note (Signed)
Addended by: Davis Gourd on: 10/24/2017 10:35 AM   Modules accepted: Orders

## 2017-10-24 NOTE — Patient Instructions (Addendum)
Follow up in 1 year or as needed We'll notify you of your lab results and make any changes if needed Continue to work on healthy diet and regular exercise- you're doing great! Call with any questions or concerns I hope your back feels better soon!! Have a great summer!!

## 2017-10-24 NOTE — Progress Notes (Signed)
   Subjective:    Patient ID: Steven Torres, male    DOB: 1961/04/02, 57 y.o.   MRN: [id number]  HPI CPE- UTD on colonoscopy.  No record of last Tdap- pt willing to update today.   Review of Systems Patient reports no vision/hearing changes, anorexia, fever ,adenopathy, persistant/recurrent hoarseness, swallowing issues, chest pain, palpitations, edema, persistant/recurrent cough, hemoptysis, dyspnea (rest,exertional, paroxysmal nocturnal), gastrointestinal  bleeding (melena, rectal bleeding), abdominal pain, excessive heart burn, GU symptoms (dysuria, hematuria, voiding/incontinence issues) syncope, focal weakness, memory loss, numbness & tingling, skin/hair/nail changes, depression, anxiety, abnormal bruising/bleeding, musculoskeletal symptoms/signs.     Objective:   Physical Exam BP 123/81   Pulse 74   Temp 98.4 F (36.9 C) (Oral)   Resp 16   Ht 6' 1"  (1.854 m)   Wt 199 lb (90.3 kg)   SpO2 97%   BMI 26.25 kg/m   General Appearance:    Alert, cooperative, no distress, appears stated age  Head:    Normocephalic, without obvious abnormality, atraumatic  Eyes:    PERRL, conjunctiva/corneas clear, EOM's intact, fundi    benign, both eyes       Ears:    Normal TM's and external ear canals, both ears  Nose:   Nares normal, septum midline, mucosa normal, no drainage   or sinus tenderness  Throat:   Lips, mucosa, and tongue normal; teeth and gums normal  Neck:   Supple, symmetrical, trachea midline, no adenopathy;       thyroid:  No enlargement/tenderness/nodules  Back:     Symmetric, no curvature, ROM normal, no CVA tenderness  Lungs:     Clear to auscultation bilaterally, respirations unlabored  Chest wall:    No tenderness or deformity  Heart:    Regular rate and rhythm, S1 and S2 normal, no murmur, rub   or gallop  Abdomen:     Soft, non-tender, bowel sounds active all four quadrants,    no masses, no organomegaly  Genitalia:    Normal male without lesion, discharge or  tenderness  Rectal:    Normal tone, normal prostate, no masses or tenderness  Extremities:   Extremities normal, atraumatic, no cyanosis or edema  Pulses:   2+ and symmetric all extremities  Skin:   Skin color, texture, turgor normal, no rashes or lesions  Lymph nodes:   Cervical, supraclavicular, and axillary nodes normal  Neurologic:   CNII-XII intact. Normal strength, sensation and reflexes      throughout          Assessment & Plan:

## 2017-10-25 ENCOUNTER — Encounter: Payer: Self-pay | Admitting: General Practice

## 2017-11-01 ENCOUNTER — Telehealth: Payer: Self-pay

## 2017-11-01 NOTE — Telephone Encounter (Signed)
Pt just recently had his physical and Dr. Birdie Riddle did lab work. Please see labs from 10-24-17. I will delete our orders for CBC and CMET if that's ok.  Please advise. Thank you.

## 2017-11-01 NOTE — Telephone Encounter (Signed)
-----   Message from Roetta Sessions, [place] sent at 08/29/2017  2:16 PM EDT ----- Regarding: labs due in July Labs (CBC and CMET) are entered.  Call pt and remind to go to lab.  Diagnosis left sided colitis

## 2017-11-01 NOTE — Telephone Encounter (Signed)
Placed recall office visit for mid October, 2019

## 2017-11-01 NOTE — Telephone Encounter (Signed)
Thanks Jan. His labs are normal which is good news. I would like to see him back in clinic in 3-4 months. Thanks

## 2017-11-02 ENCOUNTER — Encounter: Payer: 59 | Admitting: Family Medicine

## 2018-01-03 ENCOUNTER — Other Ambulatory Visit: Payer: Self-pay | Admitting: Gastroenterology

## 2018-01-03 ENCOUNTER — Other Ambulatory Visit: Payer: Self-pay

## 2018-01-03 DIAGNOSIS — K51311 Ulcerative (chronic) rectosigmoiditis with rectal bleeding: Secondary | ICD-10-CM

## 2018-01-03 MED FILL — LIALDA 1.2 GM TABLET SA: 1.2 | 90 days supply | Qty: 180 | Fill #0

## 2018-03-03 DIAGNOSIS — H5213 Myopia, bilateral: Secondary | ICD-10-CM | POA: Diagnosis not present

## 2018-04-18 MED FILL — LIALDA 1.2 GM TABLET SA: 1.2 | 90 days supply | Qty: 180 | Fill #1

## 2018-07-18 ENCOUNTER — Other Ambulatory Visit: Payer: Self-pay

## 2018-07-18 ENCOUNTER — Other Ambulatory Visit: Payer: Self-pay | Admitting: Gastroenterology

## 2018-07-18 DIAGNOSIS — K51311 Ulcerative (chronic) rectosigmoiditis with rectal bleeding: Secondary | ICD-10-CM

## 2018-07-18 MED FILL — LIALDA 1.2 GM TABLET SA: 1.2 | 90 days supply | Qty: 180 | Fill #0

## 2018-07-18 NOTE — Progress Notes (Signed)
Cbc and cmet orders placed.  Pt will go to the lab

## 2018-07-18 NOTE — Telephone Encounter (Signed)
Called and spoke to the pt.  He will go to the lab for blood work and will call and schedule an office visit some time this year with Dr. Havery Moros

## 2018-07-18 NOTE — Telephone Encounter (Signed)
Ok to refill? Your last Office note indicated he needed labs in July of last year but I don't see that he had those done.  Thank you.

## 2018-07-18 NOTE — Telephone Encounter (Signed)
Can refill and check CBC and CMET as part of routine labs. He should see me sometime this year for routine follow up. Thanks

## 2018-09-07 ENCOUNTER — Other Ambulatory Visit (INDEPENDENT_AMBULATORY_CARE_PROVIDER_SITE_OTHER): Payer: 59

## 2018-09-07 DIAGNOSIS — K51311 Ulcerative (chronic) rectosigmoiditis with rectal bleeding: Secondary | ICD-10-CM

## 2018-09-07 LAB — COMPREHENSIVE METABOLIC PANEL
ALT: 51 U/L (ref 0–53)
AST: 20 U/L (ref 0–37)
Albumin: 4.5 g/dL (ref 3.5–5.2)
Alkaline Phosphatase: 87 U/L (ref 39–117)
BUN: 20 mg/dL (ref 6–23)
CO2: 28 mEq/L (ref 19–32)
Calcium: 9.4 mg/dL (ref 8.4–10.5)
Chloride: 105 mEq/L (ref 96–112)
Creatinine, Ser: 1.12 mg/dL (ref 0.40–1.50)
GFR: 67.39 mL/min (ref >60.00)
Glucose, Bld: 91 mg/dL (ref 70–99)
Potassium: 4.4 mEq/L (ref 3.5–5.1)
Sodium: 142 mEq/L (ref 135–145)
Total Bilirubin: 0.7 mg/dL (ref 0.2–1.2)
Total Protein: 7.4 g/dL (ref 6.0–8.3)

## 2018-09-07 LAB — CBC WITH DIFFERENTIAL/PLATELET
Basophils Absolute: 0.1 10*3/uL (ref 0.0–0.1)
Basophils Relative: 1.1 % (ref 0.0–3.0)
Eosinophils Absolute: 0.1 10*3/uL (ref 0.0–0.7)
Eosinophils Relative: 1.7 % (ref 0.0–5.0)
HCT: 43.6 % (ref 39.0–52.0)
Hemoglobin: 15.1 g/dL (ref 13.0–17.0)
Lymphocytes Relative: 28.6 % (ref 12.0–46.0)
Lymphs Abs: 1.6 10*3/uL (ref 0.7–4.0)
MCHC: 34.6 g/dL (ref 30.0–36.0)
MCV: 92.3 fl (ref 78.0–100.0)
Monocytes Absolute: 0.4 10*3/uL (ref 0.1–1.0)
Monocytes Relative: 7.6 % (ref 3.0–12.0)
Neutro Abs: 3.4 10*3/uL (ref 1.4–7.7)
Neutrophils Relative %: 61 % (ref 43.0–77.0)
Platelets: 331 10*3/uL (ref 150.0–400.0)
RBC: 4.72 Mil/uL (ref 4.22–5.81)
RDW: 12.3 % (ref 11.5–15.5)
WBC: 5.6 10*3/uL (ref 4.0–10.5)

## 2018-09-11 ENCOUNTER — Other Ambulatory Visit: Payer: Self-pay

## 2018-09-11 ENCOUNTER — Encounter: Payer: Self-pay | Admitting: Family Medicine

## 2018-09-11 ENCOUNTER — Ambulatory Visit: Payer: 59 | Admitting: Family Medicine

## 2018-09-11 VITALS — BP 128/88 | HR 84 | Temp 98.3°F | Resp 16 | Ht 73.0 in | Wt 199.0 lb

## 2018-09-11 DIAGNOSIS — H7291 Unspecified perforation of tympanic membrane, right ear: Secondary | ICD-10-CM | POA: Diagnosis not present

## 2018-09-11 NOTE — Patient Instructions (Signed)
Schedule your complete physical for the end of June Get OTC earplugs to wear while showering to avoid water being trapped behind the drum If you develop pain, drainage, or other concerns- please let me know! Call with any questions or concerns Stay Safe!!!

## 2018-09-11 NOTE — Progress Notes (Signed)
   Subjective:    Patient ID: MACKENZY EISENBERG, male    DOB: October 08, 1960, 58 y.o.   MRN: [id number]  HPI R ear pain- 'I think I punctured my ear drum with a qtip this AM'.  Some bleeding.  Severe pain at time but this has resolved.  No pain prior to qtip injury.   Review of Systems For ROS see HPI     Objective:   Physical Exam Vitals signs reviewed.  Constitutional:      General: He is not in acute distress.    Appearance: Normal appearance. He is not ill-appearing.  HENT:     Head: Normocephalic and atraumatic.     Right Ear: Ear canal normal.     Left Ear: Tympanic membrane and ear canal normal.     Ears:     Comments: Superior perforation of R TM w/ scant blood in canal    Nose: Nose normal. No congestion.  Neurological:     General: No focal deficit present.     Mental Status: He is alert and oriented to person, place, and time.  Psychiatric:        Mood and Affect: Mood normal.        Behavior: Behavior normal.        Thought Content: Thought content normal.           Assessment & Plan:  Perforated ear drum- new.  Due to acute injury and not infection.  Reviewed dx and supportive care w/ pt.  Also discussed red flags that should prompt return.  Pt expressed understanding and is in agreement w/ plan.

## 2018-09-12 NOTE — Progress Notes (Signed)
Pt prescreened for 5-14 appt with [facility]

## 2018-09-14 ENCOUNTER — Encounter: Payer: Self-pay | Admitting: Gastroenterology

## 2018-09-14 ENCOUNTER — Other Ambulatory Visit: Payer: Self-pay

## 2018-09-14 ENCOUNTER — Ambulatory Visit (INDEPENDENT_AMBULATORY_CARE_PROVIDER_SITE_OTHER): Payer: 59 | Admitting: Gastroenterology

## 2018-09-14 VITALS — Ht 73.0 in | Wt 195.0 lb

## 2018-09-14 DIAGNOSIS — K625 Hemorrhage of anus and rectum: Secondary | ICD-10-CM | POA: Diagnosis not present

## 2018-09-14 DIAGNOSIS — K51311 Ulcerative (chronic) rectosigmoiditis with rectal bleeding: Secondary | ICD-10-CM

## 2018-09-14 MED ORDER — MESALAMINE 1.2 G PO TBEC: 2.4000 g | DELAYED_RELEASE_TABLET | Freq: Every day | ORAL | 3 refills | Status: DC

## 2018-09-14 NOTE — Progress Notes (Signed)
THIS ENCOUNTER IS A VIRTUAL VISIT DUE TO COVID-19 - PATIENT WAS NOT SEEN IN THE OFFICE. PATIENT HAS CONSENTED TO VIRTUAL VISIT / TELEMEDICINE VISIT. ATTEMPTS WERE MADE TO USE DOXIMITY APP FOR VISUAL CAPABILITY, UNFORTUNATELY DUE TO TECHNICAL ISSUES THIS WAS NOT POSSIBLE AND AUDIO ONLY WAS USED TO COMMUNICATE.   Location of patient: home Location of provider: office Persons participating: myself, patient  HPI :  58 y/o male with a history ofleft sidedulcerative colitis, here for a follow up visit. He has a history of left sided colitis diagnosed in 2013-2014 or so. No prior hospitalization. He had been taking Lialda historically, but had been off itsince roughly2015 or so and resumed after his last colonoscopy in Jan 2018 as below.   He continues to take Lialda and has been compliant with it since our last visit. He reports doing well in general. He has some occasional bleeding, red blood noted on the stool at times and toilet paper. He thinks due to hemorrhoids. He is having roughly 2-3 BMs per day. He denies any diarrhea or constipation. No abdominal pains. Weight is stable. He has had some R knee pain, needs to be replaced. No other joint pains. He is not using any NSAIDs routinely.   Colonoscopy 06/02/16 - isolated ileal ulceration, mild to moderate inflammation in the rectum / sigmoid colon, with cecal patch. 47m polyp, diverticulosis. Path with chronic active inflammation. Polyp was adenoma    Past Medical History:  Diagnosis Date  . Anxiety   . Chronic kidney disease   . GERD (gastroesophageal reflux disease)   . History of kidney stones   . Hyperlipidemia   . Renal stone   . Ulcerative colitis ([facility]      Past Surgical History:  Procedure Laterality Date  . BACK SURGERY    . EXTRACORPOREAL SHOCK WAVE LITHOTRIPSY Left 11/29/2016   Procedure: LEFT EXTRACORPOREAL SHOCK WAVE LITHOTRIPSY (ESWL);  Surgeon: BRaynelle Bring MD;  Location: [facility];  Service: Urology;  Laterality:  Left;  . HERNIA REPAIR    . KNEE ARTHROSCOPY     2- both on right knee  . WISDOM TOOTH EXTRACTION     Family History  Problem Relation Age of Onset  . Prostate cancer Father   . Colon polyps Mother        benign  . Heart attack Mother   . Colon cancer Neg Hx   . Rectal cancer Neg Hx   . Stomach cancer Neg Hx    Social History   Tobacco Use  . Smoking status: Never Smoker  . Smokeless tobacco: Never Used  Substance Use Topics  . Alcohol use: No  . Drug use: No   Current Outpatient Medications  Medication Sig Dispense Refill  . aspirin 81 MG tablet Take 81 mg by mouth daily.    . Cholecalciferol (VITAMIN D) 2000 UNITS CAPS Take 1 capsule by mouth daily.    .Marland KitchenGLUCOSAMINE-CHONDROITIN PO Take by mouth daily.    .Marland KitchenLIALDA 1.2 g EC tablet TAKE 2 TABLETS BY MOUTH DAILY WITH BREAKFAST. 180 tablet 0  . Multiple Vitamins-Minerals (MULTIVITAMIN ADULT PO) Take by mouth daily.    . Omega-3 Fatty Acids (FISH OIL) 1360 MG CAPS Take by mouth daily.     No current facility-administered medications for this visit.    No Known Allergies   Review of Systems: All systems reviewed and negative except where noted in HPI.   Lab Results  Component Value Date   WBC 5.6 09/07/2018  HGB 15.1 09/07/2018   HCT 43.6 09/07/2018   MCV 92.3 09/07/2018   PLT 331.0 09/07/2018   Lab Results  Component Value Date   CREATININE 1.12 09/07/2018   BUN 20 09/07/2018   NA 142 09/07/2018   K 4.4 09/07/2018   CL 105 09/07/2018   CO2 28 09/07/2018    Lab Results  Component Value Date   ALT 51 09/07/2018   AST 20 09/07/2018   ALKPHOS 87 09/07/2018   BILITOT 0.7 09/07/2018    Physical Exam: NA   ASSESSMENT AND PLAN:  58 y/o male here for reassessment of the following issues:  Left sided UC / rectal bleeding - this is the most likely diagnosis based on evaluation to date, although last colonoscopy showed one small apthous ulceration in the ileum showing chronic active inflammatory change,  raising the possibility of Crohn's disease. At the time of the exam he did use NSAIDs which could have caused that finding however. Further, if he did have Crohn's of the small bowel, would seem to be mild and would not warrant therapy. Now on Lialda monotherapy over the past 2 years and feeling well in general, but is having some rectal bleeding at times. His labs look okay without anemia. I suspect he is likely having hemorrhoidal bleeding but we discussed colonoscopy at this time to re-stage his disease while on therapy, ensure he has mucosal healing and no active inflammation. Following discussion of risks / benefits of colonoscopy and anesthesia, he wanted to proceed. He will continue Lialda otherwise, refilled this for another year. Further recommendations pending colonoscopy result.   Weston Cellar, MD [facility]

## 2018-10-02 ENCOUNTER — Other Ambulatory Visit: Payer: Self-pay

## 2018-10-02 ENCOUNTER — Ambulatory Visit (AMBULATORY_SURGERY_CENTER): Payer: 59

## 2018-10-02 ENCOUNTER — Encounter: Payer: Self-pay | Admitting: Gastroenterology

## 2018-10-02 VITALS — Ht 73.0 in | Wt 195.0 lb

## 2018-10-02 DIAGNOSIS — K625 Hemorrhage of anus and rectum: Secondary | ICD-10-CM

## 2018-10-02 MED ORDER — NA SULFATE-K SULFATE-MG SULF 17.5-3.13-1.6 GM/177ML PO SOLN: 1.0000 | Freq: Once | ORAL | 0 refills | Status: AC

## 2018-10-02 MED FILL — SUPREP BOWEL PREP KIT: 17.5-3.13-1 | 2 days supply | Qty: 354 | Fill #0

## 2018-10-02 NOTE — Progress Notes (Signed)
Denies allergies to eggs or soy products. Denies complication of anesthesia or sedation. Denies use of weight loss medication. Denies use of O2.   Emmi instructions given for colonoscopy.  Pre-Visit was conducted by phone due to Covid 19. Instructions were reviewed and mailed to patients confirmed home address. Patient was encouraged to call if he has any questions or concerns regarding instructions.

## 2018-10-04 ENCOUNTER — Telehealth: Payer: Self-pay | Admitting: Gastroenterology

## 2018-10-04 ENCOUNTER — Ambulatory Visit (INDEPENDENT_AMBULATORY_CARE_PROVIDER_SITE_OTHER): Payer: 59 | Admitting: Sports Medicine

## 2018-10-04 ENCOUNTER — Encounter: Payer: Self-pay | Admitting: Sports Medicine

## 2018-10-04 DIAGNOSIS — M51369 Other intervertebral disc degeneration, lumbar region without mention of lumbar back pain or lower extremity pain: Secondary | ICD-10-CM

## 2018-10-04 DIAGNOSIS — M5136 Other intervertebral disc degeneration, lumbar region: Secondary | ICD-10-CM

## 2018-10-04 MED ORDER — PREDNISONE 50 MG PO TABS: ORAL_TABLET | ORAL | 0 refills | Status: DC

## 2018-10-04 MED ORDER — CYCLOBENZAPRINE HCL 10 MG PO TABS: ORAL_TABLET | ORAL | 0 refills | Status: DC

## 2018-10-04 MED FILL — CYCLOBENZAPRINE HCL 10 MG T: 10 | 10 days supply | Qty: 30 | Fill #0

## 2018-10-04 MED FILL — predniSONE 50 MG TABS: 50 | 5 days supply | Qty: 5 | Fill #0

## 2018-10-04 NOTE — Telephone Encounter (Signed)
Pt is scheduled for a colonoscopy 6/11.  Pt was prescribed prednisone by PCP and would like to know if it would affect his procedure.

## 2018-10-04 NOTE — Progress Notes (Signed)
Subjective:    CC: Acute low back pain  HPI: Steven Torres is a pleasant 58 year old male, he has a history of lumbar radiculitis, did well with right L5-S1 transforaminal epidural.  More recently he was lifting a heavy object, over the next several days he developed moderate pain over his quadratus lumborum, worse with twisting, moderate, persistent without radiation, nothing radicular.  No bowel or bladder dysfunction, saddle numbness, constitutional symptoms.  I reviewed the past medical history, family history, social history, surgical history, and allergies today and no changes were needed.  Please see the problem list section below in epic for further details.  Past Medical History: Past Medical History:  Diagnosis Date  . Allergy   . Anxiety   . Chronic kidney disease   . GERD (gastroesophageal reflux disease)   . History of kidney stones   . Hyperlipidemia   . Renal stone   . Ulcerative colitis [facility])    Past Surgical History: Past Surgical History:  Procedure Laterality Date  . BACK SURGERY    . EXTRACORPOREAL SHOCK WAVE LITHOTRIPSY Left 11/29/2016   Procedure: LEFT EXTRACORPOREAL SHOCK WAVE LITHOTRIPSY (ESWL);  Surgeon: Raynelle Bring, MD;  Location: [facility];  Service: Urology;  Laterality: Left;  . HERNIA REPAIR    . KNEE ARTHROSCOPY     2- both on right knee  . WISDOM TOOTH EXTRACTION     Social History: Social History   Socioeconomic History  . Marital status: Married    Spouse name: Not on file  . Number of children: 2  . Years of education: Not on file  . Highest education level: Not on file  Occupational History  . Occupation: Passenger transport manager  Social Needs  . Financial resource strain: Not on file  . Food insecurity:    Worry: Not on file    Inability: Not on file  . Transportation needs:    Medical: Not on file    Non-medical: Not on file  Tobacco Use  . Smoking status: Never Smoker  . Smokeless tobacco: Never Used  Substance and Sexual Activity  .  Alcohol use: No  . Drug use: No  . Sexual activity: Not on file  Lifestyle  . Physical activity:    Days per week: Not on file    Minutes per session: Not on file  . Stress: Not on file  Relationships  . Social connections:    Talks on phone: Not on file    Gets together: Not on file    Attends religious service: Not on file    Active member of club or organization: Not on file    Attends meetings of clubs or organizations: Not on file    Relationship status: Not on file  Other Topics Concern  . Not on file  Social History Narrative  . Not on file   Family History: Family History  Problem Relation Age of Onset  . Prostate cancer Father   . Colon polyps Mother        benign  . Heart attack Mother   . Colon cancer Neg Hx   . Rectal cancer Neg Hx   . Stomach cancer Neg Hx   . Esophageal cancer Neg Hx    Allergies: No Known Allergies Medications: See med rec.  Review of Systems: No fevers, chills, night sweats, weight loss, chest pain, or shortness of breath.   Objective:    General: Well Developed, well nourished, and in no acute distress.  Neuro: Alert and oriented x3, extra-ocular  muscles intact, sensation grossly intact.  HEENT: Normocephalic, atraumatic, pupils equal round reactive to light, neck supple, no masses, no lymphadenopathy, thyroid nonpalpable.  Skin: Warm and dry, no rashes. Cardiac: Regular rate and rhythm, no murmurs rubs or gallops, no lower extremity edema.  Respiratory: Clear to auscultation bilaterally. Not using accessory muscles, speaking in full sentences. Back Exam:  Inspection: Unremarkable  Motion: Flexion 45 deg, Extension 45 deg, Side Bending to 45 deg bilaterally,  Rotation to 45 deg bilaterally  SLR laying: Negative  XSLR laying: Negative  Palpable tenderness: Mild pain over the right quadratus lumborum. FABER: negative. Sensory change: Gross sensation intact to all lumbar and sacral dermatomes.  Reflexes: 2+ at both patellar  tendons, 2+ at achilles tendons, Babinski's downgoing.  Strength at foot  Plantar-flexion: 5/5 Dorsi-flexion: 5/5 Eversion: 5/5 Inversion: 5/5  Leg strength  Quad: 5/5 Hamstring: 5/5 Hip flexor: 5/5 Hip abductors: 5/5  Gait unremarkable.  Impression and Recommendations:    DDD (degenerative disc disease), lumbar Recurrence of back pain, seems to be a quadratus lumborum strain. Adding prednisone, cyclobenzaprine. Rehab exercises given. If no better in 2 weeks we will discuss an additional epidural, he responded well to an L5-S1 transforaminal epidural last year.   ___________________________________________ Gwen Her. Dianah Field, M.D., ABFM., CAQSM. Primary Care and Sports Medicine [facility]  Adjunct Professor of [employer] [facility] [employer] [employer]

## 2018-10-04 NOTE — Assessment & Plan Note (Signed)
Recurrence of back pain, seems to be a quadratus lumborum strain. Adding prednisone, cyclobenzaprine. Rehab exercises given. If no better in 2 weeks we will discuss an additional epidural, he responded well to an L5-S1 transforaminal epidural last year.

## 2018-10-04 NOTE — Telephone Encounter (Signed)
Spoke with patient. Told him it's Ok to take Prednisone, he was given this for his back. Patient states it was only 5 pills. Encouraged him to drink a lot during his prep days. No further questions or concerns from the patient.

## 2018-10-10 ENCOUNTER — Telehealth: Payer: Self-pay | Admitting: *Deleted

## 2018-10-10 NOTE — Telephone Encounter (Signed)
Covid-19 screening questions  Have you traveled in the last 14 days? no If yes where?  Do you now or have you had a fever in the last 14 days? no  Do you have any respiratory symptoms of shortness of breath or cough now or in the last 14 days? no  Do you have any family members or close contacts with diagnosed or suspected Covid-19 in the past 14 days? no  Have you been tested for Covid-19 and found to be positive? No  Pt is aware that care partner will wait in the car during parking lot; if they feel like they will be too hot to wait in the car; they may wait in the lobby.  We want them to wear a mask (we do not have any that we can provide them), practice social distancing, and we will check their temperatures when they get here.  I did remind patient that their care partner needs to stay in the parking lot the entire time. Pt will wear mask into building

## 2018-10-12 ENCOUNTER — Other Ambulatory Visit: Payer: Self-pay

## 2018-10-12 ENCOUNTER — Encounter: Payer: Self-pay | Admitting: Gastroenterology

## 2018-10-12 ENCOUNTER — Ambulatory Visit (AMBULATORY_SURGERY_CENTER): Payer: 59 | Admitting: Gastroenterology

## 2018-10-12 VITALS — BP 111/65 | HR 61 | Temp 98.5°F | Resp 11 | Ht 73.0 in | Wt 198.0 lb

## 2018-10-12 DIAGNOSIS — K648 Other hemorrhoids: Secondary | ICD-10-CM | POA: Diagnosis not present

## 2018-10-12 DIAGNOSIS — D120 Benign neoplasm of cecum: Secondary | ICD-10-CM | POA: Diagnosis not present

## 2018-10-12 DIAGNOSIS — K529 Noninfective gastroenteritis and colitis, unspecified: Secondary | ICD-10-CM

## 2018-10-12 DIAGNOSIS — K625 Hemorrhage of anus and rectum: Secondary | ICD-10-CM

## 2018-10-12 DIAGNOSIS — K51519 Left sided colitis with unspecified complications: Secondary | ICD-10-CM | POA: Diagnosis not present

## 2018-10-12 DIAGNOSIS — D123 Benign neoplasm of transverse colon: Secondary | ICD-10-CM | POA: Diagnosis not present

## 2018-10-12 DIAGNOSIS — K5730 Diverticulosis of large intestine without perforation or abscess without bleeding: Secondary | ICD-10-CM

## 2018-10-12 DIAGNOSIS — Z1211 Encounter for screening for malignant neoplasm of colon: Secondary | ICD-10-CM | POA: Diagnosis not present

## 2018-10-12 DIAGNOSIS — K51511 Left sided colitis with rectal bleeding: Secondary | ICD-10-CM | POA: Diagnosis not present

## 2018-10-12 MED ORDER — SODIUM CHLORIDE 0.9 % IV SOLN: 500.0000 mL | Freq: Once | INTRAVENOUS | Status: DC

## 2018-10-12 NOTE — Patient Instructions (Signed)
   INFORMATION ON POLYPS,DIVERTICULOSIS,& HEMORRHOIDS GIVEN TO YOU TODAY  AWAIT BIOPSY RESULTS AND RESULTS OF POLYPS REMOVED     YOU HAD AN ENDOSCOPIC PROCEDURE TODAY AT [facility]:   Refer to the procedure report that was given to you for any specific questions about what was found during the examination.  If the procedure report does not answer your questions, please call your gastroenterologist to clarify.  If you requested that your care partner not be given the details of your procedure findings, then the procedure report has been included in a sealed envelope for you to review at your convenience later.  YOU SHOULD EXPECT: Some feelings of bloating in the abdomen. Passage of more gas than usual.  Walking can help get rid of the air that was put into your GI tract during the procedure and reduce the bloating. If you had a lower endoscopy (such as a colonoscopy or flexible sigmoidoscopy) you may notice spotting of blood in your stool or on the toilet paper. If you underwent a bowel prep for your procedure, you may not have a normal bowel movement for a few days.  Please Note:  You might notice some irritation and congestion in your nose or some drainage.  This is from the oxygen used during your procedure.  There is no need for concern and it should clear up in a day or so.  SYMPTOMS TO REPORT IMMEDIATELY:   Following lower endoscopy (colonoscopy or flexible sigmoidoscopy):  Excessive amounts of blood in the stool  Significant tenderness or worsening of abdominal pains  Swelling of the abdomen that is new, acute  Fever of 100F or higher    For urgent or emergent issues, a gastroenterologist can be reached at any hour by calling [phone].   DIET:  We do recommend a small meal at first, but then you may proceed to your regular diet.  Drink plenty of fluids but you should avoid alcoholic beverages for 24 hours.  ACTIVITY:  You should plan to take it easy for the  rest of today and you should NOT DRIVE or use heavy machinery until tomorrow (because of the sedation medicines used during the test).    FOLLOW UP: Our staff will call the number listed on your records 48-72 hours following your procedure to check on you and address any questions or concerns that you may have regarding the information given to you following your procedure. If we do not reach you, we will leave a message.  We will attempt to reach you two times.  During this call, we will ask if you have developed any symptoms of COVID 19. If you develop any symptoms (ie: fever, flu-like symptoms, shortness of breath, cough etc.) before then, please call [phone].  If you test positive for Covid 19 in the 2 weeks post procedure, please call and report this information to [place].    If any biopsies were taken you will be contacted by phone or by letter within the next 1-3 weeks.  Please call us at [phone] if you have not heard about the biopsies in 3 weeks.    SIGNATURES/CONFIDENTIALITY: You and/or your care partner have signed paperwork which will be entered into your electronic medical record.  These signatures attest to the fact that that the information above on your After Visit Summary has been reviewed and is understood.  Full responsibility of the confidentiality of this discharge information lies with you and/or your care-partner.

## 2018-10-12 NOTE — Progress Notes (Signed)
Called to room to assist during endoscopic procedure.  Patient ID and intended procedure confirmed with present staff. Received instructions for my participation in the procedure from the performing physician.  

## 2018-10-12 NOTE — Op Note (Signed)
[facility] Patient Name: Steven Torres Procedure Date: 10/12/2018 2:45 PM MRN: [id number] Endoscopist: Remo Lipps P. Havery Moros , MD Age: 58 Referring MD:  Date of Birth: 07-14-1960 Gender: Male Account #: [id number] Procedure:                Colonoscopy Indications:              High risk colon cancer surveillance: Ulcerative                            left sided colitis, last colonoscopy showed active                            inflammation and mild ileitis, raising question of                            Crohn's, doing well on mesalamine. Colonoscopy to                            assess response to therapy, assess ileum Medicines:                Monitored Anesthesia Care Procedure:                Pre-Anesthesia Assessment:                           - Prior to the procedure, a History and Physical                            was performed, and patient medications and                            allergies were reviewed. The patient's tolerance of                            previous anesthesia was also reviewed. The risks                            and benefits of the procedure and the sedation                            options and risks were discussed with the patient.                            All questions were answered, and informed consent                            was obtained. Prior Anticoagulants: The patient has                            taken no previous anticoagulant or antiplatelet                            agents. ASA Grade Assessment: II - A patient with  mild systemic disease. After reviewing the risks                            and benefits, the patient was deemed in                            satisfactory condition to undergo the procedure.                           After obtaining informed consent, the colonoscope                            was passed under direct vision. Throughout the                            procedure, the  patient's blood pressure, pulse, and                            oxygen saturations were monitored continuously. The                            Model CF-HQ190L [id number]) scope was introduced                            through the anus and advanced to the the terminal                            ileum, with identification of the appendiceal                            orifice and IC valve. The colonoscopy was performed                            without difficulty. The patient tolerated the                            procedure well. The quality of the bowel                            preparation was adequate. The terminal ileum,                            ileocecal valve, appendiceal orifice, and rectum                            were photographed. Scope In: 2:59:45 PM Scope Out: 3:24:45 PM Scope Withdrawal Time: 0 hours 18 minutes 30 seconds  Total Procedure Duration: 0 hours 25 minutes 0 seconds  Findings:                 The perianal and digital rectal examinations were                            normal.  The terminal ileum appeared normal.                           A 4 mm polyp was found in the cecum. The polyp was                            flat. The polyp was removed with a cold snare.                            Resection and retrieval were complete.                           A 5 mm polyp was found in the transverse colon. The                            polyp was flat. The polyp was removed with a cold                            snare. Resection and retrieval were complete.                           A few small-mouthed diverticula were found in the                            sigmoid colon.                           The colon was tortuous.                           Internal hemorrhoids were found during retroflexion.                           The exam was otherwise without abnormality. No                            active inflammation appreciated. Good  control of                            colitis.                           Biopsies were taken with a cold forceps in the                            rectum, in the sigmoid colon and in the descending                            colon for histology. Complications:            No immediate complications. Estimated blood loss:                            Minimal. Estimated Blood Loss:     Estimated blood loss was minimal. Impression:               -  The examined portion of the ileum was normal.                           - One 4 mm polyp in the cecum, removed with a cold                            snare. Resected and retrieved.                           - One 5 mm polyp in the transverse colon, removed                            with a cold snare. Resected and retrieved.                           - Diverticulosis in the sigmoid colon.                           - Tortuous colon.                           - Internal hemorrhoids.                           - The examination was otherwise normal.                           - Biopsies were taken with a cold forceps for                            histology in the rectum, in the sigmoid colon and                            in the descending colon.                           Overall good control of colitis on present regimen,                            no evidence of ileitis / small bowel involvement. Recommendation:           - Patient has a contact number available for                            emergencies. The signs and symptoms of potential                            delayed complications were discussed with the                            patient. Return to normal activities tomorrow.                            Written discharge instructions were provided to the  patient.                           - Resume previous diet.                           - Continue present medications.                           - Await pathology  results. Remo Lipps P. Jason Hauge, MD 10/12/2018 3:31:25 PM This report has been signed electronically.

## 2018-10-12 NOTE — Progress Notes (Signed)
A and O x3. Report to RN. Tolerated MAC anesthesia well.

## 2018-10-16 ENCOUNTER — Telehealth: Payer: Self-pay

## 2018-10-16 NOTE — Telephone Encounter (Signed)
  Follow up Call-  Call back number 10/12/2018 06/02/2016  Post procedure Call Back phone  # [phone]  Permission to leave phone message Yes Yes  Some recent data might be hidden     Patient questions:  Do you have a fever, pain , or abdominal swelling? No. Pain Score  0 *  Have you tolerated food without any problems? Yes.    Have you been able to return to your normal activities? Yes.    Do you have any questions about your discharge instructions: Diet   No. Medications  No. Follow up visit  No.  Do you have questions or concerns about your Care? No.  Actions: * If pain score is 4 or above: No action needed, pain <4.  1. Have you developed a fever since your procedure? no  2.   Have you had an respiratory symptoms (SOB or cough) since your procedure? no  3.   Have you tested positive for COVID 19 since your procedure no  4.   Have you had any family members/close contacts diagnosed with the COVID 19 since your procedure?  no  If yes to any of these questions please route to Joylene John, RN and Alphonsa Gin, Therapist, sports.

## 2018-10-18 ENCOUNTER — Telehealth: Payer: Self-pay | Admitting: Gastroenterology

## 2018-10-18 MED FILL — LIALDA 1.2 GM TABLET SA: 1.2 | 90 days supply | Qty: 180 | Fill #0

## 2018-10-18 NOTE — Telephone Encounter (Signed)
Called and LM for pt hat 90 day supply of Lialda with 3 refills was sent to [facility] on 5-14.

## 2018-10-18 NOTE — Telephone Encounter (Signed)
Patient called to request refill

## 2018-11-01 ENCOUNTER — Encounter: Payer: 59 | Admitting: Family Medicine

## 2018-11-09 ENCOUNTER — Ambulatory Visit (INDEPENDENT_AMBULATORY_CARE_PROVIDER_SITE_OTHER): Payer: 59 | Admitting: Family Medicine

## 2018-11-09 ENCOUNTER — Other Ambulatory Visit: Payer: Self-pay

## 2018-11-09 ENCOUNTER — Encounter: Payer: Self-pay | Admitting: Family Medicine

## 2018-11-09 VITALS — BP 128/78 | HR 81 | Temp 98.5°F | Resp 16 | Ht 73.0 in | Wt 197.2 lb

## 2018-11-09 DIAGNOSIS — Z125 Encounter for screening for malignant neoplasm of prostate: Secondary | ICD-10-CM

## 2018-11-09 DIAGNOSIS — E785 Hyperlipidemia, unspecified: Secondary | ICD-10-CM | POA: Diagnosis not present

## 2018-11-09 DIAGNOSIS — Z0000 Encounter for general adult medical examination without abnormal findings: Secondary | ICD-10-CM

## 2018-11-09 DIAGNOSIS — E663 Overweight: Secondary | ICD-10-CM

## 2018-11-09 LAB — CBC WITH DIFFERENTIAL/PLATELET
Basophils Absolute: 0 10*3/uL (ref 0.0–0.1)
Basophils Relative: 0.9 % (ref 0.0–3.0)
Eosinophils Absolute: 0.1 10*3/uL (ref 0.0–0.7)
Eosinophils Relative: 1.4 % (ref 0.0–5.0)
HCT: 46.4 % (ref 39.0–52.0)
Hemoglobin: 15.8 g/dL (ref 13.0–17.0)
Lymphocytes Relative: 26.6 % (ref 12.0–46.0)
Lymphs Abs: 1.5 10*3/uL (ref 0.7–4.0)
MCHC: 34 g/dL (ref 30.0–36.0)
MCV: 93.9 fl (ref 78.0–100.0)
Monocytes Absolute: 0.5 10*3/uL (ref 0.1–1.0)
Monocytes Relative: 8.6 % (ref 3.0–12.0)
Neutro Abs: 3.5 10*3/uL (ref 1.4–7.7)
Neutrophils Relative %: 62.5 % (ref 43.0–77.0)
Platelets: 377 10*3/uL (ref 150.0–400.0)
RBC: 4.94 Mil/uL (ref 4.22–5.81)
RDW: 12.6 % (ref 11.5–15.5)
WBC: 5.6 10*3/uL (ref 4.0–10.5)

## 2018-11-09 LAB — LIPID PANEL
Cholesterol: 222 mg/dL — ABNORMAL HIGH (ref 0–200)
HDL: 57 mg/dL (ref >39.00)
LDL Cholesterol: 143 mg/dL — ABNORMAL HIGH (ref 0–99)
NonHDL: 164.55
Total CHOL/HDL Ratio: 4
Triglycerides: 110 mg/dL (ref 0.0–149.0)
VLDL: 22 mg/dL (ref 0.0–40.0)

## 2018-11-09 LAB — BASIC METABOLIC PANEL
BUN: 19 mg/dL (ref 6–23)
CO2: 26 mEq/L (ref 19–32)
Calcium: 9.8 mg/dL (ref 8.4–10.5)
Chloride: 103 mEq/L (ref 96–112)
Creatinine, Ser: 1.11 mg/dL (ref 0.40–1.50)
GFR: 68.05 mL/min (ref >60.00)
Glucose, Bld: 91 mg/dL (ref 70–99)
Potassium: 4.5 mEq/L (ref 3.5–5.1)
Sodium: 139 mEq/L (ref 135–145)

## 2018-11-09 LAB — HEPATIC FUNCTION PANEL
ALT: 21 U/L (ref 0–53)
AST: 17 U/L (ref 0–37)
Albumin: 5 g/dL (ref 3.5–5.2)
Alkaline Phosphatase: 85 U/L (ref 39–117)
Bilirubin, Direct: 0.1 mg/dL (ref 0.0–0.3)
Total Bilirubin: 0.9 mg/dL (ref 0.2–1.2)
Total Protein: 7.6 g/dL (ref 6.0–8.3)

## 2018-11-09 LAB — PSA: PSA: 0.83 ng/mL (ref 0.10–4.00)

## 2018-11-09 LAB — TSH: TSH: 1.21 u[IU]/mL (ref 0.35–4.50)

## 2018-11-09 NOTE — Patient Instructions (Signed)
Follow up in 1 year or as needed We'll notify you of your lab results and make any changes if needed Keep up the good work on healthy diet and regular exercise- you look great! Call with any questions or concerns Stay Safe! Happy Early Rudene Anda!!

## 2018-11-09 NOTE — Assessment & Plan Note (Signed)
Chronic problem.  Attempting to control w/ diet and exercise.  Check labs and start meds prn.

## 2018-11-09 NOTE — Progress Notes (Signed)
   Subjective:    Patient ID: Steven Torres, male    DOB: [birth year], 58 y.o.   MRN: [id number]  HPI CPE- UTD on colonoscopy, Tdap.  Sees Urology Louis Meckel)   Review of Systems Patient reports no vision/hearing changes, anorexia, fever ,adenopathy, persistant/recurrent hoarseness, swallowing issues, chest pain, palpitations, edema, persistant/recurrent cough, hemoptysis, dyspnea (rest,exertional, paroxysmal nocturnal), gastrointestinal  bleeding (melena, rectal bleeding), abdominal pain, excessive heart burn, GU symptoms (dysuria, hematuria, voiding/incontinence issues) syncope, focal weakness, memory loss, numbness & tingling, skin/hair/nail changes, depression, anxiety, abnormal bruising/bleeding, musculoskeletal symptoms/signs.     Objective:   Physical Exam General Appearance:    Alert, cooperative, no distress, appears stated age  Head:    Normocephalic, without obvious abnormality, atraumatic  Eyes:    PERRL, conjunctiva/corneas clear, EOM's intact, fundi    benign, both eyes       Ears:    Normal TM's and external ear canals, both ears  Nose:   Deferred due to COVID  Throat:   Neck:   Supple, symmetrical, trachea midline, no adenopathy;       thyroid:  No enlargement/tenderness/nodules  Back:     Symmetric, no curvature, ROM normal, no CVA tenderness  Lungs:     Clear to auscultation bilaterally, respirations unlabored  Chest wall:    No tenderness or deformity  Heart:    Regular rate and rhythm, S1 and S2 normal, no murmur, rub   or gallop  Abdomen:     Soft, non-tender, bowel sounds active all four quadrants,    no masses, no organomegaly  Genitalia:    Deferred to urology  Rectal:    Extremities:   Extremities normal, atraumatic, no cyanosis or edema  Pulses:   2+ and symmetric all extremities  Skin:   Skin color, texture, turgor normal, no rashes or lesions  Lymph nodes:   Cervical, supraclavicular, and axillary nodes normal  Neurologic:   CNII-XII intact. Normal strength,  sensation and reflexes      throughout          Assessment & Plan:

## 2018-11-09 NOTE — Assessment & Plan Note (Signed)
Pt's PE WNL.  UTD on colonoscopy and immunizations.  Follows w/ Dr Louis Meckel.  Will get PSA and fax to Urology.  Check labs.  Anticipatory guidance provided.

## 2018-11-10 ENCOUNTER — Other Ambulatory Visit: Payer: Self-pay | Admitting: General Practice

## 2018-11-10 DIAGNOSIS — E785 Hyperlipidemia, unspecified: Secondary | ICD-10-CM

## 2018-11-10 MED ORDER — ATORVASTATIN CALCIUM 10 MG PO TABS: 10.0000 mg | ORAL_TABLET | Freq: Every day | ORAL | 6 refills | Status: DC

## 2018-11-10 MED FILL — ATORVASTATIN 10 MG TABLET: 10 | 30 days supply | Qty: 30 | Fill #0

## 2019-01-02 MED FILL — ATORVASTATIN 10 MG TABLET: 10 | 30 days supply | Qty: 30 | Fill #1

## 2019-01-24 MED FILL — LIALDA 1.2 GM TABLET SA: 1.2 | 90 days supply | Qty: 180 | Fill #1

## 2019-03-02 MED FILL — ATORVASTATIN 10 MG TABLET: 10 | 30 days supply | Qty: 30 | Fill #3

## 2019-04-02 MED FILL — ATORVASTATIN 10 MG TABLET: 10 | 30 days supply | Qty: 30 | Fill #4

## 2019-04-04 DIAGNOSIS — H5213 Myopia, bilateral: Secondary | ICD-10-CM | POA: Diagnosis not present

## 2019-04-22 MED FILL — LIALDA 1.2 GM TABLET SA: 1.2 | 90 days supply | Qty: 180 | Fill #2

## 2019-05-01 MED FILL — ATORVASTATIN 10 MG TABLET: 10 | 30 days supply | Qty: 30 | Fill #5

## 2019-05-30 MED FILL — ATORVASTATIN 10 MG TABLET: 10 | 30 days supply | Qty: 30 | Fill #6

## 2019-06-29 ENCOUNTER — Other Ambulatory Visit: Payer: Self-pay | Admitting: Family Medicine

## 2019-06-29 MED FILL — ATORVASTATIN 10 MG TABLET: 10 | 30 days supply | Qty: 30 | Fill #0

## 2019-07-23 MED FILL — LIALDA 1.2 GM TABLET SA: 1.2 | 90 days supply | Qty: 180 | Fill #3

## 2019-08-29 MED FILL — ATORVASTATIN 10 MG TABLET: 10 | 30 days supply | Qty: 30 | Fill #2

## 2019-09-28 MED FILL — ATORVASTATIN CALCIUM 10 MG: 10 | 30 days supply | Qty: 30 | Fill #3

## 2019-10-25 ENCOUNTER — Other Ambulatory Visit: Payer: Self-pay

## 2019-10-25 ENCOUNTER — Other Ambulatory Visit: Payer: Self-pay | Admitting: Gastroenterology

## 2019-10-25 DIAGNOSIS — K51311 Ulcerative (chronic) rectosigmoiditis with rectal bleeding: Secondary | ICD-10-CM

## 2019-10-25 MED FILL — ATORVASTATIN CALCIUM 10 MG: 10 | 30 days supply | Qty: 30 | Fill #4

## 2019-10-25 MED FILL — LIALDA 1.2 GM TABLET SA: 1.2 | 60 days supply | Qty: 120 | Fill #0

## 2019-11-13 ENCOUNTER — Other Ambulatory Visit: Payer: Self-pay

## 2019-11-13 ENCOUNTER — Ambulatory Visit (INDEPENDENT_AMBULATORY_CARE_PROVIDER_SITE_OTHER): Payer: 59 | Admitting: Family Medicine

## 2019-11-13 ENCOUNTER — Encounter: Payer: Self-pay | Admitting: Family Medicine

## 2019-11-13 VITALS — BP 130/81 | HR 71 | Temp 98.1°F | Resp 16 | Ht 73.0 in | Wt 203.1 lb

## 2019-11-13 DIAGNOSIS — Z0000 Encounter for general adult medical examination without abnormal findings: Secondary | ICD-10-CM

## 2019-11-13 DIAGNOSIS — Z125 Encounter for screening for malignant neoplasm of prostate: Secondary | ICD-10-CM | POA: Diagnosis not present

## 2019-11-13 DIAGNOSIS — E785 Hyperlipidemia, unspecified: Secondary | ICD-10-CM

## 2019-11-13 LAB — BASIC METABOLIC PANEL
BUN: 15 mg/dL (ref 6–23)
CO2: 27 mEq/L (ref 19–32)
Calcium: 9.7 mg/dL (ref 8.4–10.5)
Chloride: 104 mEq/L (ref 96–112)
Creatinine, Ser: 1.03 mg/dL (ref 0.40–1.50)
GFR: 73.92 mL/min (ref >60.00)
Glucose, Bld: 97 mg/dL (ref 70–99)
Potassium: 4.3 mEq/L (ref 3.5–5.1)
Sodium: 140 mEq/L (ref 135–145)

## 2019-11-13 LAB — CBC WITH DIFFERENTIAL/PLATELET
Basophils Absolute: 0.1 10*3/uL (ref 0.0–0.1)
Basophils Relative: 1 % (ref 0.0–3.0)
Eosinophils Absolute: 0.2 10*3/uL (ref 0.0–0.7)
Eosinophils Relative: 3.1 % (ref 0.0–5.0)
HCT: 43.3 % (ref 39.0–52.0)
Hemoglobin: 15 g/dL (ref 13.0–17.0)
Lymphocytes Relative: 26 % (ref 12.0–46.0)
Lymphs Abs: 1.4 10*3/uL (ref 0.7–4.0)
MCHC: 34.6 g/dL (ref 30.0–36.0)
MCV: 92.6 fl (ref 78.0–100.0)
Monocytes Absolute: 0.4 10*3/uL (ref 0.1–1.0)
Monocytes Relative: 7.4 % (ref 3.0–12.0)
Neutro Abs: 3.4 10*3/uL (ref 1.4–7.7)
Neutrophils Relative %: 62.5 % (ref 43.0–77.0)
Platelets: 307 10*3/uL (ref 150.0–400.0)
RBC: 4.67 Mil/uL (ref 4.22–5.81)
RDW: 12.6 % (ref 11.5–15.5)
WBC: 5.4 10*3/uL (ref 4.0–10.5)

## 2019-11-13 LAB — LIPID PANEL
Cholesterol: 149 mg/dL (ref 0–200)
HDL: 50.3 mg/dL (ref >39.00)
LDL Cholesterol: 80 mg/dL (ref 0–99)
NonHDL: 98.34
Total CHOL/HDL Ratio: 3
Triglycerides: 90 mg/dL (ref 0.0–149.0)
VLDL: 18 mg/dL (ref 0.0–40.0)

## 2019-11-13 LAB — PSA: PSA: 1.08 ng/mL (ref 0.10–4.00)

## 2019-11-13 LAB — HEPATIC FUNCTION PANEL
ALT: 28 U/L (ref 0–53)
AST: 20 U/L (ref 0–37)
Albumin: 4.8 g/dL (ref 3.5–5.2)
Alkaline Phosphatase: 98 U/L (ref 39–117)
Bilirubin, Direct: 0.2 mg/dL (ref 0.0–0.3)
Total Bilirubin: 0.9 mg/dL (ref 0.2–1.2)
Total Protein: 7.3 g/dL (ref 6.0–8.3)

## 2019-11-13 LAB — TSH: TSH: 1.23 u[IU]/mL (ref 0.35–4.50)

## 2019-11-13 NOTE — Assessment & Plan Note (Signed)
Chronic problem.  Tolerating statin w/o difficulty.  Check labs.  Adjust meds prn  

## 2019-11-13 NOTE — Progress Notes (Signed)
   Subjective:    Patient ID: Steven Torres, male    DOB: 18-Jan-1961, 59 y.o.   MRN: [id number]  HPI CPE- UTD on colonoscopy, Tdap.  Does not plan to get COVID vaccines.  Reviewed past medical, surgical, family and social histories.   Review of Systems Patient reports no vision/hearing changes, anorexia, fever ,adenopathy, persistant/recurrent hoarseness, swallowing issues, chest pain, palpitations, edema, persistant/recurrent cough, hemoptysis, dyspnea (rest,exertional, paroxysmal nocturnal), gastrointestinal  bleeding (melena, rectal bleeding), abdominal pain, excessive heart burn, GU symptoms (dysuria, hematuria, voiding/incontinence issues) syncope, focal weakness, memory loss, numbness & tingling, skin/hair/nail changes, depression, anxiety, abnormal bruising/bleeding, musculoskeletal symptoms/signs.   This visit occurred during the SARS-CoV-2 public health emergency.  Safety protocols were in place, including screening questions prior to the visit, additional usage of staff PPE, and extensive cleaning of exam room while observing appropriate contact time as indicated for disinfecting solutions.       Objective:   Physical Exam General Appearance:    Alert, cooperative, no distress, appears stated age  Head:    Normocephalic, without obvious abnormality, atraumatic  Eyes:    PERRL, conjunctiva/corneas clear, EOM's intact, fundi    benign, both eyes       Ears:    Normal TM's and external ear canals, both ears  Nose:   Deferred due to COVID  Throat:   Neck:   Supple, symmetrical, trachea midline, no adenopathy;       thyroid:  No enlargement/tenderness/nodules  Back:     Symmetric, no curvature, ROM normal, no CVA tenderness  Lungs:     Clear to auscultation bilaterally, respirations unlabored  Chest wall:    No tenderness or deformity  Heart:    Regular rate and rhythm, S1 and S2 normal, no murmur, rub   or gallop  Abdomen:     Soft, non-tender, bowel sounds active all four  quadrants,    no masses, no organomegaly  Genitalia:    Deferred to urology  Rectal:    Extremities:   Extremities normal, atraumatic, no cyanosis or edema  Pulses:   2+ and symmetric all extremities  Skin:   Skin color, texture, turgor normal, no rashes or lesions  Lymph nodes:   Cervical, supraclavicular, and axillary nodes normal  Neurologic:   CNII-XII intact. Normal strength, sensation and reflexes      throughout          Assessment & Plan:

## 2019-11-13 NOTE — Patient Instructions (Addendum)
Follow up in 6 months to recheck cholesterol We'll notify you of your lab results and make any changes if needed Keep up the good work on healthy diet and regular exercise- you look great! Dr Wynelle Link at [facility] for knee replacements Call with any questions or concerns Happy Early Birthday!

## 2019-11-13 NOTE — Assessment & Plan Note (Signed)
Pt's PE WNL.  UTD on colonoscopy, Tdap.  Declines COVID vaccines.  Due for PSA (has not seen Dr Louis Meckel recently).  Check labs.  Anticipatory guidance provided.

## 2019-11-14 ENCOUNTER — Encounter: Payer: Self-pay | Admitting: General Practice

## 2019-11-26 MED FILL — ATORVASTATIN CALCIUM 10 MG: 10 | 30 days supply | Qty: 30 | Fill #5

## 2019-12-25 ENCOUNTER — Other Ambulatory Visit: Payer: Self-pay | Admitting: Gastroenterology

## 2019-12-25 DIAGNOSIS — K51311 Ulcerative (chronic) rectosigmoiditis with rectal bleeding: Secondary | ICD-10-CM

## 2019-12-26 MED FILL — ATORVASTATIN CALCIUM 10 MG: 10 | 30 days supply | Qty: 30 | Fill #6

## 2019-12-26 MED FILL — LIALDA 1.2 GM TABLET SA: 1.2 | 30 days supply | Qty: 60 | Fill #0

## 2019-12-26 NOTE — Telephone Encounter (Signed)
Called pt and left message that he is overdue for OV with [facility]. I went ahead and scheduled him for Thursday10-28 at 9:00amto get him on the schedule since there are limited appts available and asked that he call back to reschedule if that is not a convenient day/time for him.  Will send Lialda refill to get to Oct appt.

## 2020-01-25 ENCOUNTER — Other Ambulatory Visit: Payer: Self-pay | Admitting: Family Medicine

## 2020-01-25 MED FILL — ATORVASTATIN CALCIUM 10 MG: 10 | 30 days supply | Qty: 30 | Fill #0

## 2020-01-28 MED FILL — LIALDA 1.2 GM TABLET SA: 1.2 | 30 days supply | Qty: 60 | Fill #1

## 2020-02-26 MED FILL — ATORVASTATIN CALCIUM 10 MG: 10 | 30 days supply | Qty: 30 | Fill #1

## 2020-02-28 ENCOUNTER — Ambulatory Visit: Payer: 59 | Admitting: Gastroenterology

## 2020-02-28 ENCOUNTER — Other Ambulatory Visit: Payer: 59

## 2020-02-28 ENCOUNTER — Encounter: Payer: Self-pay | Admitting: Gastroenterology

## 2020-02-28 VITALS — BP 122/72 | HR 73 | Ht 72.0 in | Wt 204.0 lb

## 2020-02-28 DIAGNOSIS — K5130 Ulcerative (chronic) rectosigmoiditis without complications: Secondary | ICD-10-CM

## 2020-02-28 MED ORDER — MESALAMINE 1.2 G PO TBEC: 2.4000 g | DELAYED_RELEASE_TABLET | Freq: Every day | ORAL | 3 refills | Status: DC

## 2020-02-28 MED ORDER — MESALAMINE 1.2 G PO TBEC
2.4000 g | DELAYED_RELEASE_TABLET | Freq: Every day | ORAL | 3 refills | Status: DC
  Filled 2020-07-23 – 2020-08-05 (×2): qty 180, 90d supply, fill #0
  Filled 2020-12-05: qty 180, 90d supply, fill #1

## 2020-02-28 MED FILL — LIALDA 1.2 GM TABLET SA: 1.2 | 90 days supply | Qty: 180 | Fill #0

## 2020-02-28 NOTE — Patient Instructions (Addendum)
If you are age 59 or older, your body mass index should be between 23-30. Your Body mass index is 27.67 kg/m. If this is out of the aforementioned range listed, please consider follow up with your Primary Care Provider.  If you are age 43 or younger, your body mass index should be between 19-25. Your Body mass index is 27.67 kg/m. If this is out of the aformentioned range listed, please consider follow up with your Primary Care Provider.   We have sent the following medications to your pharmacy for you to pick up at your convenience: Lialda  Please go to the lab in the basement of our building to have lab work done as you leave today. Hit "B" for basement when you get on the elevator.  When the doors open the lab is on your left.  We will call you with the results. Thank you.  Due to recent changes in healthcare laws, you may see the results of your imaging and laboratory studies on MyChart before your provider has had a chance to review them.  We understand that in some cases there may be results that are confusing or concerning to you. Not all laboratory results come back in the same time frame and the provider may be waiting for multiple results in order to interpret others.  Please give [place] 48 hours in order for your provider to thoroughly review all the results before contacting the office for clarification of your results.    Follow up in 1 year.  Thank you for entrusting me with your care and for choosing [facility], Dr. Cornelius Cellar

## 2020-02-28 NOTE — Progress Notes (Signed)
HPI :  59y/o male with a history ofleft sidedulcerative colitis, here for a follow up visit. Left sided colitis diagnosed in 2013-2014 or so. No prior hospitalization. He had been taking Lialda historically, but had been off itsince roughly2015 or soand resumed after his last colonoscopy in Jan 2018 as below.   Since his last visit with me he had a follow-up colonoscopy in June 2020 to ensure he did not have Crohn's disease in light of findings on 2018 and ensure inflammation had healed with resumption of the Lialda.  On the exam in 2020 he had no active inflammation that was appreciable.  Biopsies of the left colon showed mild patchy active colitis.  He incidentally had 2 small sessile serrated polyps that were removed as well.  He has been compliant with his Lialda since have last seen him.  He tolerates it well, he takes it every day.  He denies any problems with his bowel at all.  No blood in his stool.  No diarrhea or urgency.  No abdominal pains.  He denies any NSAID use.  Generally feels well today without any complaints at all.  Colonoscopy 06/02/16 - isolated ileal ulceration, mild to moderate inflammation in the rectum / sigmoid colon, with cecal patch. 80m polyp, diverticulosis. Path with chronic active inflammation. Polyp was adenoma  Colonoscopy 10/12/18 - The perianal and digital rectal examinations were normal. - The terminal ileum appeared normal. - A 4 mm polyp was found in the cecum. The polyp was flat. The polyp was removed with a cold snare. Resection and retrieval were complete. - A 5 mm polyp was found in the transverse colon. The polyp was flat. The polyp was removed with a cold snare. Resection and retrieval were complete. - A few small-mouthed diverticula were found in the sigmoid colon. - The colon was tortuous. - Internal hemorrhoids were found during retroflexion. - The exam was otherwise without abnormality. No active inflammation appreciated. Good control  of colitis. - Biopsies were taken with a cold forceps in the rectum, in the sigmoid colon and in the descending colon for histology.  1. Surgical [P], transverse, cecal, polyp (2) - SESSILE SERRATED POLYP(S) WITHOUT CYTOLOGIC DYSPLASIA 2. Surgical [P], left colon BX - PATCHY, MILDLY ACTIVE CHRONIC COLITIS, CONSISTENT WITH PATIENT'S CLINICAL HISTORY OF INFLAMMATORY BOWEL DISEASE. - NEGATIVE FOR GRANULOMAS OR DYSPLASIA    Past Medical History:  Diagnosis Date  . Allergy   . Anxiety   . Chronic kidney disease   . GERD (gastroesophageal reflux disease)   . History of kidney stones   . Hyperlipidemia   . Renal stone   . Ulcerative colitis ([facility]      Past Surgical History:  Procedure Laterality Date  . BACK SURGERY    . EXTRACORPOREAL SHOCK WAVE LITHOTRIPSY Left 11/29/2016   Procedure: LEFT EXTRACORPOREAL SHOCK WAVE LITHOTRIPSY (ESWL);  Surgeon: BRaynelle Bring MD;  Location: [facility];  Service: Urology;  Laterality: Left;  . HERNIA REPAIR    . KNEE ARTHROSCOPY     2- both on right knee  . WISDOM TOOTH EXTRACTION     Family History  Problem Relation Age of Onset  . Prostate cancer Father   . Colon polyps Mother        benign  . Heart attack Mother   . Colon cancer Neg Hx   . Rectal cancer Neg Hx   . Stomach cancer Neg Hx   . Esophageal cancer Neg Hx    Social History   Tobacco  Use  . Smoking status: Never Smoker  . Smokeless tobacco: Never Used  Vaping Use  . Vaping Use: Never used  Substance Use Topics  . Alcohol use: No  . Drug use: No   Current Outpatient Medications  Medication Sig Dispense Refill  . aspirin 81 MG tablet Take 81 mg by mouth daily.    Marland Kitchen atorvastatin (LIPITOR) 10 MG tablet TAKE 1 TABLET BY MOUTH ONCE DAILY 30 tablet 6  . Cholecalciferol (VITAMIN D) 2000 UNITS CAPS Take 1 capsule by mouth daily.    Marland Kitchen GLUCOSAMINE-CHONDROITIN PO Take by mouth daily.    . mesalamine (LIALDA) 1.2 g EC tablet Take 2 tablets (2.4 g total) by mouth daily with  breakfast. Please keep your October appointment for further refills. Thank you 60 tablet 1  . Multiple Vitamins-Minerals (MULTIVITAMIN ADULT PO) Take by mouth daily.    . Omega-3 Fatty Acids (FISH OIL) 1360 MG CAPS Take by mouth daily.     No current facility-administered medications for this visit.   No Known Allergies   Review of Systems: All systems reviewed and negative except where noted in HPI.   Lab Results  Component Value Date   WBC 5.4 11/13/2019   HGB 15.0 11/13/2019   HCT 43.3 11/13/2019   MCV 92.6 11/13/2019   PLT 307.0 11/13/2019    Lab Results  Component Value Date   CREATININE 1.03 11/13/2019   BUN 15 11/13/2019   NA 140 11/13/2019   K 4.3 11/13/2019   CL 104 11/13/2019   CO2 27 11/13/2019    Lab Results  Component Value Date   ALT 28 11/13/2019   AST 20 11/13/2019   ALKPHOS 98 11/13/2019   BILITOT 0.9 11/13/2019     Physical Exam: BP 122/72   Pulse 73   Ht 6' (1.829 m)   Wt 204 lb (92.5 kg)   BMI 27.67 kg/m  Constitutional: Pleasant,well-developed, male in no acute distress. Abdominal: Soft, nondistended, nontender. There are no masses palpable.  Extremities: no edema Lymphadenopathy: No cervical adenopathy noted. Neurological: Alert and oriented to person place and time. Skin: Skin is warm and dry. No rashes noted. Psychiatric: Normal mood and affect. Behavior is normal.   ASSESSMENT AND PLAN: 59 year old male here for reassessment the following:  Left-sided ulcerative colitis - his course has been mild since diagnosis.  In 2018 he was off therapy and his colonoscopy showed some active inflammation with some mild ileitis.  Now back on Lialda and compliant with it, follow-up colonoscopy last June showed no gross inflammation, although biopsies showed some mild microscopic activity.  Ileitis had since resolved.  Discussed his colitis with him.  He is doing really well and tolerating Lialda, we will continue that for now.  I do think it is  reasonable to follow-up with some objective evaluation to ensure no active inflammation, we discussed his risk for colon cancer slightly increased if he has ongoing active inflammation over years.  Following discussion of options we will proceed with a fecal calprotectin make sure that looks okay.  I do not feel strongly that he needs another colonoscopy right now.  I will otherwise refill his Lialda and plan on seeing him in 1 year or sooner if any issues.  If fecal calprotectin is elevated for some reason we will discuss options.  He agreed  [place] Cellar, MD [facility]

## 2020-03-14 ENCOUNTER — Other Ambulatory Visit: Payer: 59

## 2020-03-14 DIAGNOSIS — K5130 Ulcerative (chronic) rectosigmoiditis without complications: Secondary | ICD-10-CM

## 2020-03-20 LAB — CALPROTECTIN, FECAL: Calprotectin, Fecal: 38 ug/g (ref 0–120)

## 2020-03-25 MED FILL — ATORVASTATIN CALCIUM 10 MG: 10 | 30 days supply | Qty: 30 | Fill #2

## 2020-04-10 DIAGNOSIS — H5213 Myopia, bilateral: Secondary | ICD-10-CM | POA: Diagnosis not present

## 2020-04-21 MED FILL — ATORVASTATIN CALCIUM 10 MG: 10 | 30 days supply | Qty: 30 | Fill #3

## 2020-05-14 ENCOUNTER — Ambulatory Visit: Payer: 59 | Admitting: Family Medicine

## 2020-05-21 ENCOUNTER — Encounter: Payer: Self-pay | Admitting: Family Medicine

## 2020-05-21 ENCOUNTER — Telehealth (INDEPENDENT_AMBULATORY_CARE_PROVIDER_SITE_OTHER): Payer: 59 | Admitting: Family Medicine

## 2020-05-21 ENCOUNTER — Other Ambulatory Visit: Payer: Self-pay

## 2020-05-21 DIAGNOSIS — E785 Hyperlipidemia, unspecified: Secondary | ICD-10-CM

## 2020-05-21 NOTE — Progress Notes (Signed)
   Virtual Visit via Video   I connected with patient on 05/21/20 at  9:30 AM EST by a video enabled telemedicine application and verified that I am speaking with the correct person using two identifiers.  Location patient: Home Location provider: [facility], Office Persons participating in the virtual visit: Patient, Provider, [place] (Sabrina M)  I discussed the limitations of evaluation and management by telemedicine and the availability of in person appointments. The patient expressed understanding and agreed to proceed.  Subjective:   HPI:   Hyperlipidemia- chronic problem.  On Lipitor 13m daily.  Denies CP, SOB, abd pain, N/V, myalgias.  Limited exercise b/c work has been very busy.  Reports he didn't change his eating habits over the holidays- I applauded his efforts.  ROS:   See pertinent positives and negatives per HPI.  Patient Active Problem List   Diagnosis Date Noted  . Primary osteoarthritis of both hands 08/18/2017  . Primary osteoarthritis of both feet 08/18/2017  . DDD (degenerative disc disease), lumbar 08/18/2017  . History of gastroesophageal reflux (GERD) 08/18/2017  . Special screening for malignant neoplasms, colon 06/02/2016  . Anxiety state 04/14/2016  . Seasonal allergies 09/15/2015  . Dyslipidemia 04/13/2015  . Physical exam 03/31/2015  . Left sided colitis ([place] 03/13/2012    Social History   Tobacco Use  . Smoking status: Never Smoker  . Smokeless tobacco: Never Used  Substance Use Topics  . Alcohol use: No    Current Outpatient Medications:  .  aspirin 81 MG tablet, Take 81 mg by mouth daily., Disp: , Rfl:  .  atorvastatin (LIPITOR) 10 MG tablet, TAKE 1 TABLET BY MOUTH ONCE DAILY, Disp: 30 tablet, Rfl: 6 .  Cholecalciferol (VITAMIN D) 2000 UNITS CAPS, Take 1 capsule by mouth daily., Disp: , Rfl:  .  GLUCOSAMINE-CHONDROITIN PO, Take by mouth daily., Disp: , Rfl:  .  mesalamine (LIALDA) 1.2 g EC tablet, Take 2 tablets (2.4 g total) by  mouth daily with breakfast., Disp: 180 tablet, Rfl: 3 .  Multiple Vitamins-Minerals (MULTIVITAMIN ADULT PO), Take by mouth daily., Disp: , Rfl:  .  Omega-3 Fatty Acids (FISH OIL) 1360 MG CAPS, Take by mouth daily., Disp: , Rfl:   No Known Allergies  Objective:   There were no vitals taken for this visit. AAOx3, NAD NCAT, EOMI No obvious CN deficits Coloring WNL Pt is able to speak clearly, coherently without shortness of breath or increased work of breathing.  Thought process is linear.  Mood is appropriate.   Assessment and Plan:   Hyperlipidemia- chronic problem, tolerating Lipitor 163mdaily w/o difficulty.  Applauded healthy diet, encouraged regular exercise.  Check labs.  Adjust meds prn   KaAnnye AsaMD 05/21/2020

## 2020-05-21 NOTE — Progress Notes (Signed)
I connected with  Steven Torres on 05/21/20 by a video enabled telemedicine application and verified that I am speaking with the correct person using two identifiers.   I discussed the limitations of evaluation and management by telemedicine. The patient expressed understanding and agreed to proceed.

## 2020-06-17 ENCOUNTER — Other Ambulatory Visit (INDEPENDENT_AMBULATORY_CARE_PROVIDER_SITE_OTHER): Payer: 59

## 2020-06-17 ENCOUNTER — Other Ambulatory Visit: Payer: Self-pay

## 2020-06-17 DIAGNOSIS — E785 Hyperlipidemia, unspecified: Secondary | ICD-10-CM

## 2020-06-17 LAB — LIPID PANEL
Cholesterol: 151 mg/dL (ref 0–200)
HDL: 52.6 mg/dL (ref >39.00)
LDL Cholesterol: 84 mg/dL (ref 0–99)
NonHDL: 98.27
Total CHOL/HDL Ratio: 3
Triglycerides: 73 mg/dL (ref 0.0–149.0)
VLDL: 14.6 mg/dL (ref 0.0–40.0)

## 2020-06-17 LAB — BASIC METABOLIC PANEL
BUN: 17 mg/dL (ref 6–23)
CO2: 29 mEq/L (ref 19–32)
Calcium: 9.8 mg/dL (ref 8.4–10.5)
Chloride: 104 mEq/L (ref 96–112)
Creatinine, Ser: 1.14 mg/dL (ref 0.40–1.50)
GFR: 70.37 mL/min (ref >60.00)
Glucose, Bld: 101 mg/dL — ABNORMAL HIGH (ref 70–99)
Potassium: 4.2 mEq/L (ref 3.5–5.1)
Sodium: 141 mEq/L (ref 135–145)

## 2020-06-17 LAB — HEPATIC FUNCTION PANEL
ALT: 36 U/L (ref 0–53)
AST: 23 U/L (ref 0–37)
Albumin: 4.5 g/dL (ref 3.5–5.2)
Alkaline Phosphatase: 81 U/L (ref 39–117)
Bilirubin, Direct: 0.1 mg/dL (ref 0.0–0.3)
Total Bilirubin: 0.7 mg/dL (ref 0.2–1.2)
Total Protein: 7.4 g/dL (ref 6.0–8.3)

## 2020-06-25 MED FILL — ATORVASTATIN CALCIUM 10 MG: 10 | 30 days supply | Qty: 30 | Fill #5

## 2020-07-23 ENCOUNTER — Other Ambulatory Visit (HOSPITAL_BASED_OUTPATIENT_CLINIC_OR_DEPARTMENT_OTHER): Payer: Self-pay

## 2020-07-23 MED FILL — Atorvastatin Calcium Tab 10 MG (Base Equivalent): ORAL | 30 days supply | Qty: 30 | Fill #0 | Status: AC

## 2020-07-25 ENCOUNTER — Other Ambulatory Visit (HOSPITAL_BASED_OUTPATIENT_CLINIC_OR_DEPARTMENT_OTHER): Payer: Self-pay

## 2020-08-05 ENCOUNTER — Other Ambulatory Visit (HOSPITAL_BASED_OUTPATIENT_CLINIC_OR_DEPARTMENT_OTHER): Payer: Self-pay

## 2020-08-11 ENCOUNTER — Other Ambulatory Visit (HOSPITAL_BASED_OUTPATIENT_CLINIC_OR_DEPARTMENT_OTHER): Payer: Self-pay

## 2020-08-12 ENCOUNTER — Other Ambulatory Visit (HOSPITAL_BASED_OUTPATIENT_CLINIC_OR_DEPARTMENT_OTHER): Payer: Self-pay

## 2020-08-15 ENCOUNTER — Other Ambulatory Visit (HOSPITAL_BASED_OUTPATIENT_CLINIC_OR_DEPARTMENT_OTHER): Payer: Self-pay

## 2020-08-21 ENCOUNTER — Other Ambulatory Visit: Payer: Self-pay | Admitting: Family Medicine

## 2020-08-22 ENCOUNTER — Other Ambulatory Visit (HOSPITAL_BASED_OUTPATIENT_CLINIC_OR_DEPARTMENT_OTHER): Payer: Self-pay

## 2020-08-22 MED ORDER — ATORVASTATIN CALCIUM 10 MG PO TABS
ORAL_TABLET | ORAL | 6 refills | Status: DC
  Filled 2020-08-22: qty 30, 30d supply, fill #0
  Filled 2020-09-22: qty 90, 90d supply, fill #1
  Filled 2020-12-02 – 2020-12-04 (×2): qty 90, 90d supply, fill #2

## 2020-09-23 ENCOUNTER — Other Ambulatory Visit (HOSPITAL_BASED_OUTPATIENT_CLINIC_OR_DEPARTMENT_OTHER): Payer: Self-pay

## 2020-10-29 ENCOUNTER — Encounter: Payer: Self-pay | Admitting: *Deleted

## 2020-11-19 ENCOUNTER — Encounter: Payer: Self-pay | Admitting: Family Medicine

## 2020-11-19 ENCOUNTER — Other Ambulatory Visit: Payer: Self-pay

## 2020-11-19 ENCOUNTER — Ambulatory Visit (INDEPENDENT_AMBULATORY_CARE_PROVIDER_SITE_OTHER): Payer: 59 | Admitting: Family Medicine

## 2020-11-19 VITALS — BP 130/80 | HR 67 | Temp 97.3°F | Resp 20 | Ht 73.0 in | Wt 207.0 lb

## 2020-11-19 DIAGNOSIS — Z125 Encounter for screening for malignant neoplasm of prostate: Secondary | ICD-10-CM

## 2020-11-19 DIAGNOSIS — Z0000 Encounter for general adult medical examination without abnormal findings: Secondary | ICD-10-CM | POA: Diagnosis not present

## 2020-11-19 DIAGNOSIS — E785 Hyperlipidemia, unspecified: Secondary | ICD-10-CM | POA: Diagnosis not present

## 2020-11-19 DIAGNOSIS — K51519 Left sided colitis with unspecified complications: Secondary | ICD-10-CM

## 2020-11-19 DIAGNOSIS — R0789 Other chest pain: Secondary | ICD-10-CM

## 2020-11-19 LAB — LIPID PANEL
Cholesterol: 125 mg/dL (ref 0–200)
HDL: 47 mg/dL (ref >39.00)
LDL Cholesterol: 58 mg/dL (ref 0–99)
NonHDL: 77.83
Total CHOL/HDL Ratio: 3
Triglycerides: 99 mg/dL (ref 0.0–149.0)
VLDL: 19.8 mg/dL (ref 0.0–40.0)

## 2020-11-19 LAB — HEPATIC FUNCTION PANEL
ALT: 20 U/L (ref 0–53)
AST: 16 U/L (ref 0–37)
Albumin: 4.6 g/dL (ref 3.5–5.2)
Alkaline Phosphatase: 88 U/L (ref 39–117)
Bilirubin, Direct: 0.2 mg/dL (ref 0.0–0.3)
Total Bilirubin: 0.9 mg/dL (ref 0.2–1.2)
Total Protein: 7 g/dL (ref 6.0–8.3)

## 2020-11-19 LAB — CBC WITH DIFFERENTIAL/PLATELET
Basophils Absolute: 0 10*3/uL (ref 0.0–0.1)
Basophils Relative: 0.7 % (ref 0.0–3.0)
Eosinophils Absolute: 0.1 10*3/uL (ref 0.0–0.7)
Eosinophils Relative: 1.3 % (ref 0.0–5.0)
HCT: 43 % (ref 39.0–52.0)
Hemoglobin: 15 g/dL (ref 13.0–17.0)
Lymphocytes Relative: 21 % (ref 12.0–46.0)
Lymphs Abs: 1.2 10*3/uL (ref 0.7–4.0)
MCHC: 34.8 g/dL (ref 30.0–36.0)
MCV: 92.7 fl (ref 78.0–100.0)
Monocytes Absolute: 0.4 10*3/uL (ref 0.1–1.0)
Monocytes Relative: 6.5 % (ref 3.0–12.0)
Neutro Abs: 4.1 10*3/uL (ref 1.4–7.7)
Neutrophils Relative %: 70.5 % (ref 43.0–77.0)
Platelets: 298 10*3/uL (ref 150.0–400.0)
RBC: 4.64 Mil/uL (ref 4.22–5.81)
RDW: 12.6 % (ref 11.5–15.5)
WBC: 5.8 10*3/uL (ref 4.0–10.5)

## 2020-11-19 LAB — BASIC METABOLIC PANEL
BUN: 15 mg/dL (ref 6–23)
CO2: 28 mEq/L (ref 19–32)
Calcium: 9.5 mg/dL (ref 8.4–10.5)
Chloride: 104 mEq/L (ref 96–112)
Creatinine, Ser: 1.15 mg/dL (ref 0.40–1.50)
GFR: 69.42 mL/min (ref >60.00)
Glucose, Bld: 92 mg/dL (ref 70–99)
Potassium: 4.3 mEq/L (ref 3.5–5.1)
Sodium: 141 mEq/L (ref 135–145)

## 2020-11-19 LAB — PSA: PSA: 1 ng/mL (ref 0.10–4.00)

## 2020-11-19 LAB — TSH: TSH: 1.17 u[IU]/mL (ref 0.35–5.50)

## 2020-11-19 NOTE — Progress Notes (Signed)
Subjective:    Patient ID: Steven Torres, male    DOB: 01-04-1961, 60 y.o.   MRN: [id number]  HPI CPE- UTD on colonoscopy, Tdap.  Had 1 COVID vaccines and developed a rash.  Not interested in completing series.  Reviewed past medical, surgical, family and social histories.   Health Maintenance  Topic Date Due   Zoster Vaccines- Shingrix (1 of 2) Never done   COVID-19 Vaccine (2 - Pfizer series) 12/05/2020 (Originally 12/27/2019)   INFLUENZA VACCINE  12/01/2020   COLONOSCOPY (Pts 45-92yr Insurance coverage will need to be confirmed)  10/12/2022   TETANUS/TDAP  10/25/2027   Hepatitis C Screening  Completed   HIV Screening  Completed   Pneumococcal Vaccine [id number] old  Aged Out   HPV VACCINES  Aged Out     Patient Care Team    Relationship Specialty Notifications Start End  TMidge Minium MD PCP - General Family Medicine  09/15/15   Armbruster, SCarlota Raspberry MD Consulting Physician Gastroenterology  10/24/17   HArdis Hughs MD Attending Physician Urology  11/09/18       Review of Systems Patient reports no vision/hearing changes, anorexia, fever ,adenopathy, persistant/recurrent hoarseness, swallowing issues, palpitations, edema, persistant/recurrent cough, hemoptysis, dyspnea (rest,exertional, paroxysmal nocturnal), gastrointestinal  bleeding (melena, rectal bleeding), abdominal pain, excessive heart burn, GU symptoms (dysuria, hematuria, voiding/incontinence issues) syncope, focal weakness, memory loss, numbness & tingling, skin/hair/nail changes, depression, anxiety, abnormal bruising/bleeding, musculoskeletal symptoms/signs.   Sharp, intermittent L sided chest pain that occurs w/ certain movements or deep breaths.  Is reproducible at times.  Pain tends to be short lived.  Most recent occurrence was last night.  Denies SOB, nausea.  No relief w/ belching.  Not related to exertion  This visit occurred during the SARS-CoV-2 public health emergency.  Safety protocols were in  place, including screening questions prior to the visit, additional usage of staff PPE, and extensive cleaning of exam room while observing appropriate contact time as indicated for disinfecting solutions.      Objective:   Physical Exam General Appearance:    Alert, cooperative, no distress, appears stated age  Head:    Normocephalic, without obvious abnormality, atraumatic  Eyes:    PERRL, conjunctiva/corneas clear, EOM's intact, fundi    benign, both eyes       Ears:    Normal TM's and external ear canals, both ears  Nose:   Deferred due to COVID  Throat:   Neck:   Supple, symmetrical, trachea midline, no adenopathy;       thyroid:  No enlargement/tenderness/nodules  Back:     Symmetric, no curvature, ROM normal, no CVA tenderness  Lungs:     Clear to auscultation bilaterally, respirations unlabored  Chest wall:    No tenderness or deformity  Heart:    Regular rate and rhythm, S1 and S2 normal, no murmur, rub   or gallop  Abdomen:     Soft, non-tender, bowel sounds active all four quadrants,    no masses, no organomegaly  Genitalia:    deferred  Rectal:    Extremities:   Extremities normal, atraumatic, no cyanosis or edema  Pulses:   2+ and symmetric all extremities  Skin:   Skin color, texture, turgor normal, no rashes or lesions  Lymph nodes:   Cervical, supraclavicular, and axillary nodes normal  Neurologic:   CNII-XII intact. Normal strength, sensation and reflexes      throughout          Assessment &  Plan:   Atypical CP- new.  Based on the description of his sxs, it definitely seems more musculoskeletal than cardiac.  EKG was WNL.  Pain is reproducible at times and it doesn't occur w/ exertion.  All of this is reassuring.  He is to monitor his sxs and if there is any change or worsening, he is to let me know.  Pt expressed understanding and is in agreement w/ plan.

## 2020-11-19 NOTE — Patient Instructions (Addendum)
Follow up in 6 months to recheck cholesterol We'll notify you of your lab results and make any changes if needed Continue to work on healthy diet and regular exercise- you're doing great! IF the chest pain worsens or becomes more frequent- let me know Call with any questions or concerns Stay Safe!  Stay Healthy! HAPPY BIRTHDAY!!!!

## 2020-11-19 NOTE — Assessment & Plan Note (Signed)
Pt's PE WNL.  UTD on colonoscopy, Tdap.  Not interested in completing COVID series.  Check labs.  Anticipatory guidance provided.

## 2020-11-19 NOTE — Assessment & Plan Note (Signed)
Chronic problem.  Following w/ Dr Havery Moros.  On Lialda.

## 2020-11-19 NOTE — Assessment & Plan Note (Signed)
Chronic problem.  Tolerating statin w/o difficulty.  Check labs.  Adjust meds prn  

## 2020-12-03 ENCOUNTER — Other Ambulatory Visit (HOSPITAL_BASED_OUTPATIENT_CLINIC_OR_DEPARTMENT_OTHER): Payer: Self-pay

## 2020-12-04 ENCOUNTER — Other Ambulatory Visit (HOSPITAL_BASED_OUTPATIENT_CLINIC_OR_DEPARTMENT_OTHER): Payer: Self-pay

## 2020-12-05 ENCOUNTER — Other Ambulatory Visit (HOSPITAL_BASED_OUTPATIENT_CLINIC_OR_DEPARTMENT_OTHER): Payer: Self-pay

## 2020-12-12 ENCOUNTER — Other Ambulatory Visit (HOSPITAL_BASED_OUTPATIENT_CLINIC_OR_DEPARTMENT_OTHER): Payer: Self-pay

## 2020-12-12 ENCOUNTER — Ambulatory Visit
Admission: EM | Admit: 2020-12-12 | Discharge: 2020-12-12 | Disposition: A | Discharge status: Home / Self Care | Payer: 59 | Attending: Emergency Medicine | Admitting: Emergency Medicine

## 2020-12-12 ENCOUNTER — Other Ambulatory Visit: Payer: Self-pay

## 2020-12-12 DIAGNOSIS — M545 Low back pain, unspecified: Secondary | ICD-10-CM

## 2020-12-12 MED ORDER — DICLOFENAC SODIUM 1 % EX GEL
2.0000 g | Freq: Four times a day (QID) | CUTANEOUS | 0 refills | Status: DC
  Filled 2020-12-12: qty 100, 13d supply, fill #0

## 2020-12-12 MED ORDER — KETOROLAC TROMETHAMINE 30 MG/ML IJ SOLN
30.0000 mg | Freq: Once | INTRAMUSCULAR | Status: AC
  Administered 2020-12-12: 30 mg via INTRAMUSCULAR

## 2020-12-12 MED ORDER — TIZANIDINE HCL 2 MG PO TABS
2.0000 mg | ORAL_TABLET | Freq: Four times a day (QID) | ORAL | 0 refills | Status: DC | PRN
  Filled 2020-12-12: qty 30, 4d supply, fill #0

## 2020-12-12 NOTE — ED Triage Notes (Signed)
Patient presents to Urgent Care with complaints of left lower back pain since weds. Pt states he has a hx of chronic back pain but usually affects right side. He report pain increases when moving from sitting to standing position. Not taking any meds for pain.   Denies fever, changes in activity, or urinary symptoms.

## 2020-12-12 NOTE — Discharge Instructions (Addendum)
We gave you a shot of Toradol Continue with Tylenol at home, topical diclofenac Supplement with tizanidine as needed-this is a muscle relaxer, will cause drowsiness, do not drive or work after taking Alternate ice and heat Gentle stretching Avoid complete bedrest Follow-up if not improving or worsening

## 2020-12-13 NOTE — ED Provider Notes (Signed)
[facility]    CSN: [id number] Arrival date & time: 12/12/20  1438      History   Chief Complaint Chief Complaint  Patient presents with   Back Pain    HPI Steven Torres is a 60 y.o. male history of GERD, hyperlipidemia, ulcerative colitis, presenting today for evaluation of back pain.  Reports left lower back pain x2 days.  Denies any injury or trauma.  Reports history of similar but typically on the right side.  Has had prior back surgery.  Denies any radiation into extremities.  Denies numbness or tingling.  Denies any changes in urination.  Avoids NSAIDs due to ulcerative colitis, but does report tolerating Toradol.  HPI  Past Medical History:  Diagnosis Date   Allergy    Anxiety    Chronic kidney disease    GERD (gastroesophageal reflux disease)    History of kidney stones    Hyperlipidemia    Renal stone    Ulcerative colitis [facility]     Patient Active Problem List   Diagnosis Date Noted   Primary osteoarthritis of both hands 08/18/2017   Primary osteoarthritis of both feet 08/18/2017   DDD (degenerative disc disease), lumbar 08/18/2017   History of gastroesophageal reflux (GERD) 08/18/2017   Special screening for malignant neoplasms, colon 06/02/2016   Anxiety state 04/14/2016   Seasonal allergies 09/15/2015   Dyslipidemia 04/13/2015   Physical exam 03/31/2015   Left sided colitis ([place]) 03/13/2012    Past Surgical History:  Procedure Laterality Date   BACK SURGERY     EXTRACORPOREAL SHOCK WAVE LITHOTRIPSY Left 11/29/2016   Procedure: LEFT EXTRACORPOREAL SHOCK WAVE LITHOTRIPSY (ESWL);  Surgeon: Raynelle Bring, MD;  Location: [facility];  Service: Urology;  Laterality: Left;   HERNIA REPAIR     KNEE ARTHROSCOPY     2- both on right knee   WISDOM TOOTH EXTRACTION         Home Medications    Prior to Admission medications   Medication Sig Start Date End Date Taking? Authorizing Provider  diclofenac Sodium (VOLTAREN) 1 % GEL Apply 2 g topically 4  (four) times daily. 12/12/20  Yes Yaileen Hofferber C, PA-C  tiZANidine (ZANAFLEX) 2 MG tablet Take 1-2 tablets (2-4 mg total) by mouth every 6 (six) hours as needed for muscle spasms. 12/12/20  Yes Jahnai Slingerland C, PA-C  aspirin 81 MG tablet Take 81 mg by mouth daily.    [provider]  atorvastatin (LIPITOR) 10 MG tablet TAKE 1 TABLET BY MOUTH ONCE DAILY 08/22/20   Midge Minium, MD  Cholecalciferol (VITAMIN D) 2000 UNITS CAPS Take 1 capsule by mouth daily.    [provider]  GLUCOSAMINE-CHONDROITIN PO Take by mouth daily.    [provider]  mesalamine (LIALDA) 1.2 g EC tablet Take 2 tablets by mouth daily with breakfast. 02/28/20   Armbruster, Carlota Raspberry, MD  Multiple Vitamins-Minerals (MULTIVITAMIN ADULT PO) Take by mouth daily.    [provider]  Omega-3 Fatty Acids (FISH OIL) 1360 MG CAPS Take by mouth daily.    [provider]    Family History Family History  Problem Relation Age of Onset   Prostate cancer Father    Colon polyps Mother        benign   Heart attack Mother    Colon cancer Neg Hx    Rectal cancer Neg Hx    Stomach cancer Neg Hx    Esophageal cancer Neg Hx     Social  History Social History   Tobacco Use   Smoking status: Never   Smokeless tobacco: Never  Vaping Use   Vaping Use: Never used  Substance Use Topics   Alcohol use: No   Drug use: No     Allergies   Patient has no known allergies.   Review of Systems Review of Systems  Constitutional:  Negative for fatigue and fever.  Eyes:  Negative for redness, itching and visual disturbance.  Respiratory:  Negative for shortness of breath.   Cardiovascular:  Negative for chest pain and leg swelling.  Gastrointestinal:  Negative for nausea and vomiting.  Musculoskeletal:  Positive for back pain and myalgias. Negative for arthralgias.  Skin:  Negative for color change, rash and wound.  Neurological:  Negative for dizziness, syncope, weakness,  light-headedness and headaches.    Physical Exam Triage Vital Signs ED Triage Vitals  Enc Vitals Group     BP 12/12/20 1506 137/77     Pulse Rate 12/12/20 1506 76     Resp 12/12/20 1506 16     Temp 12/12/20 1506 98.2 F (36.8 C)     Temp Source 12/12/20 1506 Oral     SpO2 12/12/20 1506 95 %     Weight --      Height --      Head Circumference --      Peak Flow --      Pain Score 12/12/20 1504 9     Pain Loc --      Pain Edu? --      Excl. in [place]? --    No data found.  Updated Vital Signs BP 137/77 (BP Location: Left Arm)   Pulse 76   Temp 98.2 F (36.8 C) (Oral)   Resp 16   SpO2 95%   Visual Acuity Right Eye Distance:   Left Eye Distance:   Bilateral Distance:    Right Eye Near:   Left Eye Near:    Bilateral Near:     Physical Exam Vitals and nursing note reviewed.  Constitutional:      Appearance: He is well-developed.     Comments: No acute distress  HENT:     Head: Normocephalic and atraumatic.     Nose: Nose normal.  Eyes:     Conjunctiva/sclera: Conjunctivae normal.  Cardiovascular:     Rate and Rhythm: Normal rate.  Pulmonary:     Effort: Pulmonary effort is normal. No respiratory distress.  Abdominal:     General: There is no distension.  Musculoskeletal:        General: Normal range of motion.     Cervical back: Neck supple.     Comments: Patient standing-nontender palpation of cervical spine midline, no palpable deformity or step-off, no visible swelling or erythema, tenderness to palpation to left paraspinal musculature  Skin:    General: Skin is warm and dry.  Neurological:     Mental Status: He is alert and oriented to person, place, and time.     UC Treatments / Results  Labs (all labs ordered are listed, but only abnormal results are displayed) Labs Reviewed - No data to display  EKG   Radiology No results found.  Procedures Procedures (including critical care time)  Medications Ordered in UC Medications  ketorolac  (TORADOL) 30 MG/ML injection 30 mg (30 mg Intramuscular Given 12/12/20 1530)    Initial Impression / Assessment and Plan / UC Course  I have reviewed the triage vital signs and the nursing notes.  Pertinent labs & imaging results that were available during my care of the patient were reviewed by me and considered in my medical decision making (see chart for details).     Low back pain-no red flags for cauda equina, low suspicion of GU cause, recommending continued anti-inflammatories, trial of muscle relaxers.  Provided Toradol prior to discharge, diclofenac topically and tizanidine to use at bedtime.  Discussed activity modifications.  Discussed strict return precautions. Patient verbalized understanding and is agreeable with plan.  Final Clinical Impressions(s) / UC Diagnoses   Final diagnoses:  Acute left-sided low back pain without sciatica     Discharge Instructions      We gave you a shot of Toradol Continue with Tylenol at home, topical diclofenac Supplement with tizanidine as needed-this is a muscle relaxer, will cause drowsiness, do not drive or work after taking Alternate ice and heat Gentle stretching Avoid complete bedrest Follow-up if not improving or worsening   ED Prescriptions     Medication Sig Dispense Auth. Provider   diclofenac Sodium (VOLTAREN) 1 % GEL Apply 2 g topically 4 (four) times daily. 100 g Jezelle Gullick C, PA-C   tiZANidine (ZANAFLEX) 2 MG tablet Take 1-2 tablets (2-4 mg total) by mouth every 6 (six) hours as needed for muscle spasms. 30 tablet Anddy Wingert, [place] C, PA-C      PDMP not reviewed this encounter.   Janith Lima, [place] 12/13/20 1130

## 2021-03-03 ENCOUNTER — Other Ambulatory Visit: Payer: Self-pay | Admitting: Gastroenterology

## 2021-03-03 DIAGNOSIS — K5130 Ulcerative (chronic) rectosigmoiditis without complications: Secondary | ICD-10-CM

## 2021-03-04 ENCOUNTER — Other Ambulatory Visit: Payer: Self-pay | Admitting: Gastroenterology

## 2021-03-04 ENCOUNTER — Other Ambulatory Visit (HOSPITAL_BASED_OUTPATIENT_CLINIC_OR_DEPARTMENT_OTHER): Payer: Self-pay

## 2021-03-04 DIAGNOSIS — K5130 Ulcerative (chronic) rectosigmoiditis without complications: Secondary | ICD-10-CM

## 2021-03-04 MED ORDER — MESALAMINE 1.2 G PO TBEC
2.4000 g | DELAYED_RELEASE_TABLET | Freq: Every day | ORAL | 1 refills | Status: DC
  Filled 2021-03-04: qty 120, 60d supply, fill #0

## 2021-03-18 ENCOUNTER — Other Ambulatory Visit: Payer: Self-pay | Admitting: Family Medicine

## 2021-03-19 ENCOUNTER — Other Ambulatory Visit (HOSPITAL_BASED_OUTPATIENT_CLINIC_OR_DEPARTMENT_OTHER): Payer: Self-pay

## 2021-03-19 MED ORDER — ATORVASTATIN CALCIUM 10 MG PO TABS
ORAL_TABLET | ORAL | 6 refills | Status: DC
  Filled 2021-03-19: qty 30, 30d supply, fill #0
  Filled 2021-04-19: qty 90, 90d supply, fill #1
  Filled 2021-07-16: qty 90, 90d supply, fill #2

## 2021-04-13 DIAGNOSIS — H5213 Myopia, bilateral: Secondary | ICD-10-CM | POA: Diagnosis not present

## 2021-04-13 LAB — HM DIABETES EYE EXAM

## 2021-04-20 ENCOUNTER — Other Ambulatory Visit (HOSPITAL_BASED_OUTPATIENT_CLINIC_OR_DEPARTMENT_OTHER): Payer: Self-pay

## 2021-04-23 ENCOUNTER — Telehealth: Payer: Self-pay | Admitting: Gastroenterology

## 2021-04-23 ENCOUNTER — Other Ambulatory Visit (HOSPITAL_COMMUNITY): Payer: Self-pay

## 2021-04-23 MED ORDER — MESALAMINE 1.2 G PO TBEC
2.4000 g | DELAYED_RELEASE_TABLET | Freq: Every day | ORAL | 1 refills | Status: DC
  Filled 2021-04-23 – 2021-04-29 (×2): qty 60, 30d supply, fill #0

## 2021-04-23 NOTE — Telephone Encounter (Signed)
Patient called to make his yearly f/u appt for his colitis with the doctor for 06/05/21 at 9 a.m.  He said prior to that date, he would be out of his Lialda and would need a refill.  As of now, he has about a week to 10 days left.  Thank you.

## 2021-04-23 NOTE — Telephone Encounter (Signed)
30 day with 1 refill has been sent to pharmacy. Further refills will be given at patient's appointment.

## 2021-04-29 ENCOUNTER — Other Ambulatory Visit (HOSPITAL_BASED_OUTPATIENT_CLINIC_OR_DEPARTMENT_OTHER): Payer: Self-pay

## 2021-04-30 ENCOUNTER — Other Ambulatory Visit (HOSPITAL_COMMUNITY): Payer: Self-pay

## 2021-05-14 ENCOUNTER — Telehealth: Payer: Self-pay

## 2021-05-14 NOTE — Telephone Encounter (Signed)
Ok to establish 

## 2021-05-14 NOTE — Telephone Encounter (Signed)
Pt has been scheduled.  °

## 2021-05-14 NOTE — Telephone Encounter (Signed)
Caller name:Janice Owens Shark (Wife)  On DPR? :Yes  Call back [phone]  Provider they see: Birdie Riddle   Reason for call:Pt wife is calling to see if Dr Birdie Riddle will take her on as new patient  she does work for [employer] as well and wants to see if Dr Birdie Riddle will take her on as a new patient

## 2021-05-18 ENCOUNTER — Ambulatory Visit: Payer: 59 | Admitting: Family Medicine

## 2021-06-05 ENCOUNTER — Encounter: Payer: Self-pay | Admitting: Gastroenterology

## 2021-06-05 ENCOUNTER — Other Ambulatory Visit: Payer: No Typology Code available for payment source

## 2021-06-05 ENCOUNTER — Ambulatory Visit: Payer: No Typology Code available for payment source | Admitting: Gastroenterology

## 2021-06-05 ENCOUNTER — Other Ambulatory Visit (HOSPITAL_BASED_OUTPATIENT_CLINIC_OR_DEPARTMENT_OTHER): Payer: Self-pay

## 2021-06-05 VITALS — BP 134/70 | HR 89 | Ht 73.0 in | Wt 212.0 lb

## 2021-06-05 DIAGNOSIS — K5130 Ulcerative (chronic) rectosigmoiditis without complications: Secondary | ICD-10-CM | POA: Diagnosis not present

## 2021-06-05 MED ORDER — MESALAMINE 1.2 G PO TBEC
2.4000 g | DELAYED_RELEASE_TABLET | Freq: Every day | ORAL | 3 refills | Status: DC
  Filled 2021-06-05: qty 180, 90d supply, fill #0
  Filled 2021-09-06: qty 180, 90d supply, fill #1
  Filled 2021-12-09: qty 180, 90d supply, fill #2
  Filled 2022-03-08: qty 180, 90d supply, fill #3

## 2021-06-05 NOTE — Patient Instructions (Addendum)
If you are age 61 or older, your body mass index should be between 23-30. Your Body mass index is 27.97 kg/m. If this is out of the aforementioned range listed, please consider follow up with your Primary Care Provider.  If you are age 57 or younger, your body mass index should be between 19-25. Your Body mass index is 27.97 kg/m. If this is out of the aformentioned range listed, please consider follow up with your Primary Care Provider.   ________________________________________________________  The [facility] providers would like to encourage you to use [facility] to communicate with providers for non-urgent requests or questions.  Due to long hold times on the telephone, sending your provider a message by [facility] may be a faster and more efficient way to get a response.  Please allow 48 business hours for a response.  Please remember that this is for non-urgent requests.  _______________________________________________________  Please go to the lab in the basement of our building to have lab work done as you leave today. Hit "B" for basement when you get on the elevator.  When the doors open the lab is on your left.  We will call you with the results. Thank you.  We have sent the following medications to your pharmacy for you to pick up at your convenience: Lialda: Take 2 tablets daily  Please follow up in 1 year.  Thank you for entrusting me with your care and for choosing [facility], Dr. [place] Cellar

## 2021-06-05 NOTE — Progress Notes (Signed)
HPI :  61 y/o male with a history of left sided ulcerative colitis, here for a follow up visit. Left sided colitis diagnosed in 2013-2014 or so. No prior hospitalization. He had been taking Lialda historically, but had been off it since roughly 2015 or so and resumed after his last colonoscopy in Jan 2018 as below which showed some moderate to mild inflammation in the left colon.  I last saw him in October 2021.  At that time he was feeling well and was compliant with Lialda, he has been taking it since 2018, denies any side effects from the medicine.  After our last visit he had a normal fecal calprotectin.  He has not had any anemia.  Recall that he had a colonoscopy in June 2020 to ensure he did not have Crohn's disease in light of findings on 2018 and ensure inflammation had healed with resumption of the Lialda.  On the exam in 2020 he had no active inflammation that was appreciable but biopsies of the left colon showed mild patchy active colitis. He incidentally had 2 small sessile serrated polyps that were removed as well.  He denies any family history of colon cancer.  Denies any abdominal pain or blood in the stools which were his index symptoms at first diagnosis.  States his bowels are regular.  He has not had any flares of his colitis and overall doing really well.  Prior workup:  Colonoscopy 06/02/16 - isolated ileal ulceration, mild to moderate inflammation in the rectum / sigmoid colon, with cecal patch. 2m polyp, diverticulosis. Path with chronic active inflammation. Polyp was adenoma   Colonoscopy 10/12/18 - The perianal and digital rectal examinations were normal. - The terminal ileum appeared normal. - A 4 mm polyp was found in the cecum. The polyp was flat. The polyp was removed with a cold snare. Resection and retrieval were complete. - A 5 mm polyp was found in the transverse colon. The polyp was flat. The polyp was removed with a cold snare. Resection and retrieval were  complete. - A few small-mouthed diverticula were found in the sigmoid colon. - The colon was tortuous. - Internal hemorrhoids were found during retroflexion. - The exam was otherwise without abnormality. No active inflammation appreciated. Good control of colitis. - Biopsies were taken with a cold forceps in the rectum, in the sigmoid colon and in the descending colon for histology.   1. Surgical [P], transverse, cecal, polyp (2) - SESSILE SERRATED POLYP(S) WITHOUT CYTOLOGIC DYSPLASIA 2. Surgical [P], left colon BX - PATCHY, MILDLY ACTIVE CHRONIC COLITIS, CONSISTENT WITH PATIENT'S CLINICAL HISTORY OF INFLAMMATORY BOWEL DISEASE. - NEGATIVE FOR GRANULOMAS OR DYSPLASIA  Fecal calprotectin 03/14/20 - 38    Past Medical History:  Diagnosis Date   Allergy    Anxiety    Chronic kidney disease    GERD (gastroesophageal reflux disease)    History of kidney stones    Hyperlipidemia    Renal stone    [place]      Past Surgical History:  Procedure Laterality Date   BACK SURGERY     EXTRACORPOREAL SHOCK WAVE LITHOTRIPSY Left 11/29/2016   Procedure: LEFT EXTRACORPOREAL SHOCK WAVE LITHOTRIPSY (ESWL);  Surgeon: BRaynelle Bring MD;  Location: [facility];  Service: Urology;  Laterality: Left;   HERNIA REPAIR     KNEE ARTHROSCOPY     2- both on right knee   WISDOM TOOTH EXTRACTION     Family History  Problem Relation Age of Onset  Prostate cancer Father    Colon polyps Mother        benign   Heart attack Mother    Colon cancer Neg Hx    Rectal cancer Neg Hx    Stomach cancer Neg Hx    Esophageal cancer Neg Hx    Social History   Tobacco Use   Smoking status: Never   Smokeless tobacco: Never  Vaping Use   Vaping Use: Never used  Substance Use Topics   Alcohol use: No   Drug use: No   Current Outpatient Medications  Medication Sig Dispense Refill   aspirin 81 MG tablet Take 81 mg by mouth daily.     atorvastatin (LIPITOR) 10 MG tablet TAKE 1 TABLET BY MOUTH  ONCE DAILY 30 tablet 6   Cholecalciferol (VITAMIN D) 2000 UNITS CAPS Take 1 capsule by mouth daily.     GLUCOSAMINE-CHONDROITIN PO Take by mouth daily.     Multiple Vitamins-Minerals (MULTIVITAMIN ADULT PO) Take by mouth daily.     Omega-3 Fatty Acids (FISH OIL) 1360 MG CAPS Take by mouth daily.     mesalamine (LIALDA) 1.2 g EC tablet Take 2 tablets (2.4 g total) by mouth daily with breakfast. 180 tablet 3   No current facility-administered medications for this visit.   No Known Allergies   Review of Systems: All systems reviewed and negative except where noted in HPI.   Lab Results  Component Value Date   WBC 5.8 11/19/2020   HGB 15.0 11/19/2020   HCT 43.0 11/19/2020   MCV 92.7 11/19/2020   PLT 298.0 11/19/2020    Lab Results  Component Value Date   CREATININE 1.15 11/19/2020   BUN 15 11/19/2020   NA 141 11/19/2020   K 4.3 11/19/2020   CL 104 11/19/2020   CO2 28 11/19/2020    Lab Results  Component Value Date   ALT 20 11/19/2020   AST 16 11/19/2020   ALKPHOS 88 11/19/2020   BILITOT 0.9 11/19/2020     Physical Exam: BP 134/70    Pulse 89    Ht 6' 1"  (1.854 m)    Wt 212 lb (96.2 kg)    BMI 27.97 kg/m  Constitutional: Pleasant,well-developed, male in no acute distress. Neurological: Alert and oriented to person place and time. Psychiatric: Normal mood and affect. Behavior is normal.   ASSESSMENT AND PLAN: 60 year old male here for reassessment following:  Left-sided ulcerative colitis  Generally doing well and clinically in remission on Lialda monotherapy.  We have discussed colitis in general, long-term goals for treatment are to 1) eliminate his symptoms and 2) to reduce his risk for colon cancer.  He has had disease now for about 10 years, we discussed over time his risk for colon cancer may increase if he has active inflammation.  He is compliant with his Lialda.  We discussed surveillance options at this point time.  I do not feel strongly he needs a  colonoscopy right now but will consider that in the next 1 to 2 years.  We will plan on a fecal calprotectin at this time to make sure okay.  Refilled Lialda.  He will see me in 1 year or sooner with any issues or flares of his disease.  He agrees with the plan.  Jolly Mango, MD [facility]

## 2021-06-10 ENCOUNTER — Other Ambulatory Visit (HOSPITAL_BASED_OUTPATIENT_CLINIC_OR_DEPARTMENT_OTHER): Payer: Self-pay

## 2021-07-17 ENCOUNTER — Other Ambulatory Visit (HOSPITAL_BASED_OUTPATIENT_CLINIC_OR_DEPARTMENT_OTHER): Payer: Self-pay

## 2021-09-07 ENCOUNTER — Other Ambulatory Visit (HOSPITAL_BASED_OUTPATIENT_CLINIC_OR_DEPARTMENT_OTHER): Payer: Self-pay

## 2021-10-12 ENCOUNTER — Other Ambulatory Visit: Payer: Self-pay | Admitting: Family Medicine

## 2021-10-12 ENCOUNTER — Other Ambulatory Visit (HOSPITAL_BASED_OUTPATIENT_CLINIC_OR_DEPARTMENT_OTHER): Payer: Self-pay

## 2021-10-12 MED ORDER — ATORVASTATIN CALCIUM 10 MG PO TABS
ORAL_TABLET | ORAL | 6 refills | Status: DC
  Filled 2021-10-12: qty 30, 30d supply, fill #0
  Filled 2021-11-16: qty 30, 30d supply, fill #1
  Filled 2021-12-09: qty 30, 30d supply, fill #2
  Filled 2022-01-14: qty 30, 30d supply, fill #3
  Filled 2022-02-10: qty 30, 30d supply, fill #4
  Filled 2022-03-08: qty 30, 30d supply, fill #5
  Filled 2022-04-14: qty 30, 30d supply, fill #6

## 2021-11-16 ENCOUNTER — Other Ambulatory Visit (HOSPITAL_BASED_OUTPATIENT_CLINIC_OR_DEPARTMENT_OTHER): Payer: Self-pay

## 2021-11-20 ENCOUNTER — Ambulatory Visit (INDEPENDENT_AMBULATORY_CARE_PROVIDER_SITE_OTHER): Payer: No Typology Code available for payment source | Admitting: Family Medicine

## 2021-11-20 ENCOUNTER — Encounter: Payer: Self-pay | Admitting: Family Medicine

## 2021-11-20 VITALS — BP 120/80 | HR 77 | Temp 97.4°F | Resp 16 | Ht 73.0 in | Wt 205.2 lb

## 2021-11-20 DIAGNOSIS — E785 Hyperlipidemia, unspecified: Secondary | ICD-10-CM | POA: Diagnosis not present

## 2021-11-20 DIAGNOSIS — Z125 Encounter for screening for malignant neoplasm of prostate: Secondary | ICD-10-CM

## 2021-11-20 DIAGNOSIS — Z0000 Encounter for general adult medical examination without abnormal findings: Secondary | ICD-10-CM | POA: Diagnosis not present

## 2021-11-20 LAB — CBC WITH DIFFERENTIAL/PLATELET
Basophils Absolute: 0.1 10*3/uL (ref 0.0–0.1)
Basophils Relative: 0.8 % (ref 0.0–3.0)
Eosinophils Absolute: 0.1 10*3/uL (ref 0.0–0.7)
Eosinophils Relative: 1.8 % (ref 0.0–5.0)
HCT: 43.8 % (ref 39.0–52.0)
Hemoglobin: 14.8 g/dL (ref 13.0–17.0)
Lymphocytes Relative: 23.4 % (ref 12.0–46.0)
Lymphs Abs: 1.5 10*3/uL (ref 0.7–4.0)
MCHC: 33.9 g/dL (ref 30.0–36.0)
MCV: 94.2 fl (ref 78.0–100.0)
Monocytes Absolute: 0.5 10*3/uL (ref 0.1–1.0)
Monocytes Relative: 8.3 % (ref 3.0–12.0)
Neutro Abs: 4.3 10*3/uL (ref 1.4–7.7)
Neutrophils Relative %: 65.7 % (ref 43.0–77.0)
Platelets: 305 10*3/uL (ref 150.0–400.0)
RBC: 4.65 Mil/uL (ref 4.22–5.81)
RDW: 12.4 % (ref 11.5–15.5)
WBC: 6.5 10*3/uL (ref 4.0–10.5)

## 2021-11-20 LAB — HEPATIC FUNCTION PANEL
ALT: 23 U/L (ref 0–53)
AST: 23 U/L (ref 0–37)
Albumin: 5 g/dL (ref 3.5–5.2)
Alkaline Phosphatase: 78 U/L (ref 39–117)
Bilirubin, Direct: 0.2 mg/dL (ref 0.0–0.3)
Total Bilirubin: 1.1 mg/dL (ref 0.2–1.2)
Total Protein: 7.4 g/dL (ref 6.0–8.3)

## 2021-11-20 LAB — PSA: PSA: 0.86 ng/mL (ref 0.10–4.00)

## 2021-11-20 LAB — BASIC METABOLIC PANEL
BUN: 16 mg/dL (ref 6–23)
CO2: 28 mEq/L (ref 19–32)
Calcium: 10 mg/dL (ref 8.4–10.5)
Chloride: 106 mEq/L (ref 96–112)
Creatinine, Ser: 1.09 mg/dL (ref 0.40–1.50)
GFR: 73.52 mL/min (ref >60.00)
Glucose, Bld: 91 mg/dL (ref 70–99)
Potassium: 4.4 mEq/L (ref 3.5–5.1)
Sodium: 142 mEq/L (ref 135–145)

## 2021-11-20 LAB — LIPID PANEL
Cholesterol: 149 mg/dL (ref 0–200)
HDL: 49.1 mg/dL (ref >39.00)
LDL Cholesterol: 81 mg/dL (ref 0–99)
NonHDL: 99.85
Total CHOL/HDL Ratio: 3
Triglycerides: 96 mg/dL (ref 0.0–149.0)
VLDL: 19.2 mg/dL (ref 0.0–40.0)

## 2021-11-20 LAB — TSH: TSH: 1.17 u[IU]/mL (ref 0.35–5.50)

## 2021-11-20 NOTE — Assessment & Plan Note (Signed)
Chronic problem.  Tolerating statin w/o difficulty.  Check labs.  Adjust meds prn  

## 2021-11-20 NOTE — Assessment & Plan Note (Signed)
Pt's PE WNL.  UTD on colonoscopy, Tdap.  Check labs.  Anticipatory guidance provided.

## 2021-11-20 NOTE — Progress Notes (Signed)
   Subjective:    Patient ID: Steven Torres, male    DOB: [birth year], 61 y.o.   MRN: [id number]  HPI CPE- UTD on colonoscopy, Tdap.  Pt reports exercising.  Patient Care Team    Relationship Specialty Notifications Start End  Midge Minium, MD PCP - General Family Medicine  09/15/15   Armbruster, Carlota Raspberry, MD Consulting Physician Gastroenterology  10/24/17   Ardis Hughs, MD Attending Physician Urology  11/09/18     Health Maintenance  Topic Date Due   Zoster Vaccines- Shingrix (1 of 2) 02/20/2022 (Originally 11/22/2010)   INFLUENZA VACCINE  12/01/2021   COLONOSCOPY (Pts 45-87yr Insurance coverage will need to be confirmed)  10/12/2022   TETANUS/TDAP  10/25/2027   Hepatitis C Screening  Completed   HIV Screening  Completed   HPV VACCINES  Aged Out   COVID-19 Vaccine  Discontinued      Review of Systems Patient reports no vision/hearing changes, anorexia, fever ,adenopathy, persistant/recurrent hoarseness, swallowing issues, chest pain, palpitations, edema, persistant/recurrent cough, hemoptysis, dyspnea (rest,exertional, paroxysmal nocturnal), gastrointestinal  bleeding (melena, rectal bleeding), abdominal pain, excessive heart burn, GU symptoms (dysuria, hematuria, voiding/incontinence issues) syncope, focal weakness, memory loss, numbness & tingling, skin/hair/nail changes, depression, anxiety, abnormal bruising/bleeding, musculoskeletal symptoms/signs.     Objective:   Physical Exam General Appearance:    Alert, cooperative, no distress, appears stated age  Head:    Normocephalic, without obvious abnormality, atraumatic  Eyes:    PERRL, conjunctiva/corneas clear, EOM's intact both eyes       Ears:    Normal TM's and external ear canals, both ears  Nose:   Nares normal, septum midline, mucosa normal, no drainage   or sinus tenderness  Throat:   Lips, mucosa, and tongue normal; teeth and gums normal  Neck:   Supple, symmetrical, trachea midline, no adenopathy;        thyroid:  No enlargement/tenderness/nodules  Back:     Symmetric, no curvature, ROM normal, no CVA tenderness  Lungs:     Clear to auscultation bilaterally, respirations unlabored  Chest wall:    No tenderness or deformity  Heart:    Regular rate and rhythm, S1 and S2 normal, no murmur, rub   or gallop  Abdomen:     Soft, non-tender, bowel sounds active all four quadrants,    no masses, no organomegaly  Genitalia:    deferred  Rectal:    Extremities:   Extremities normal, atraumatic, no cyanosis or edema  Pulses:   2+ and symmetric all extremities  Skin:   Skin color, texture, turgor normal, no rashes or lesions  Lymph nodes:   Cervical, supraclavicular, and axillary nodes normal  Neurologic:   CNII-XII intact. Normal strength, sensation and reflexes      throughout          Assessment & Plan:

## 2021-11-20 NOTE — Patient Instructions (Signed)
Follow up in 1 yr or as needed We'll notify you of your lab results and make any changes if needed Keep up the good work!  You look great! Call with any questions or concerns Stay Safe!  Stay Healthy! HAPPY BIRTHDAY!!!!

## 2021-11-23 ENCOUNTER — Telehealth: Payer: Self-pay

## 2021-11-23 NOTE — Telephone Encounter (Signed)
Informed pt of lab results

## 2021-11-23 NOTE — Telephone Encounter (Signed)
-----   Message from Midge Minium, MD sent at 11/23/2021  7:32 AM EDT ----- Labs look great!  No changes at this time

## 2021-11-24 ENCOUNTER — Encounter: Payer: Self-pay | Admitting: Family Medicine

## 2021-12-10 ENCOUNTER — Other Ambulatory Visit (HOSPITAL_BASED_OUTPATIENT_CLINIC_OR_DEPARTMENT_OTHER): Payer: Self-pay

## 2022-01-14 ENCOUNTER — Other Ambulatory Visit (HOSPITAL_BASED_OUTPATIENT_CLINIC_OR_DEPARTMENT_OTHER): Payer: Self-pay

## 2022-02-11 ENCOUNTER — Other Ambulatory Visit (HOSPITAL_BASED_OUTPATIENT_CLINIC_OR_DEPARTMENT_OTHER): Payer: Self-pay

## 2022-03-05 ENCOUNTER — Ambulatory Visit (INDEPENDENT_AMBULATORY_CARE_PROVIDER_SITE_OTHER): Payer: No Typology Code available for payment source | Admitting: Sports Medicine

## 2022-03-05 ENCOUNTER — Encounter: Payer: Self-pay | Admitting: Sports Medicine

## 2022-03-05 ENCOUNTER — Ambulatory Visit (INDEPENDENT_AMBULATORY_CARE_PROVIDER_SITE_OTHER): Payer: No Typology Code available for payment source

## 2022-03-05 VITALS — Wt 212.0 lb

## 2022-03-05 DIAGNOSIS — M25561 Pain in right knee: Secondary | ICD-10-CM

## 2022-03-05 DIAGNOSIS — M170 Bilateral primary osteoarthritis of knee: Secondary | ICD-10-CM | POA: Insufficient documentation

## 2022-03-05 DIAGNOSIS — M25562 Pain in left knee: Secondary | ICD-10-CM

## 2022-03-05 MED ORDER — ACETAMINOPHEN ER 650 MG PO TBCR: 650.0000 mg | EXTENDED_RELEASE_TABLET | Freq: Three times a day (TID) | ORAL | 3 refills | Status: DC | PRN

## 2022-03-05 NOTE — Progress Notes (Signed)
    Procedures performed today:    None.  Independent interpretation of notes and tests performed by another provider:   None.  Brief History, Exam, Impression, and Recommendations:    Primary osteoarthritis of both knees This is a very pleasant 61 year old male, has a long history of bilateral knee pain, currently left worse than right although the left is improving to some degree. On exam he has pain with terminal flexion. Positive McMurray's sign on the left. I do think he has osteoarthritis with a degenerative meniscal tear, adding bilateral x-rays, arthritis from Tylenol, home physical therapy. I gave him an exercise prescription as well as some pointers on what to avoid in the gym. Return to see me in about 3 weeks, we can switch to Celebrex if insufficiently better, he does have colitis so we need to be careful with NSAIDs. If this fails we will see him back in 2 more weeks and we will use bilateral steroid injections, if this fails I will see him back again in 4 to 6 weeks for bilateral viscosupplementation. If this fails only then would be candidate for arthroplasty.    ____________________________________________ Gwen Her. Dianah Field, M.D., ABFM., CAQSM., AME. Primary Care and Sports Medicine [facility]  Adjunct Professor of [employer]  Risk manager

## 2022-03-05 NOTE — Assessment & Plan Note (Signed)
This is a very pleasant 61 year old male, has a long history of bilateral knee pain, currently left worse than right although the left is improving to some degree. On exam he has pain with terminal flexion. Positive McMurray's sign on the left. I do think he has osteoarthritis with a degenerative meniscal tear, adding bilateral x-rays, arthritis from Tylenol, home physical therapy. I gave him an exercise prescription as well as some pointers on what to avoid in the gym. Return to see me in about 3 weeks, we can switch to Celebrex if insufficiently better, he does have colitis so we need to be careful with NSAIDs. If this fails we will see him back in 2 more weeks and we will use bilateral steroid injections, if this fails I will see him back again in 4 to 6 weeks for bilateral viscosupplementation. If this fails only then would be candidate for arthroplasty.

## 2022-03-07 ENCOUNTER — Other Ambulatory Visit: Payer: Self-pay

## 2022-03-07 ENCOUNTER — Ambulatory Visit
Admission: RE | Admit: 2022-03-07 | Discharge: 2022-03-07 | Disposition: A | Discharge status: Home / Self Care | Payer: No Typology Code available for payment source | Source: Ambulatory Visit | Attending: Family Medicine | Admitting: Family Medicine

## 2022-03-07 VITALS — BP 163/90 | HR 74 | Temp 98.7°F | Resp 18 | Ht 73.0 in | Wt 200.0 lb

## 2022-03-07 DIAGNOSIS — M545 Low back pain, unspecified: Secondary | ICD-10-CM | POA: Diagnosis not present

## 2022-03-07 MED ORDER — KETOROLAC TROMETHAMINE 60 MG/2ML IM SOLN
60.0000 mg | Freq: Once | INTRAMUSCULAR | Status: AC
  Administered 2022-03-07: 60 mg via INTRAMUSCULAR

## 2022-03-07 MED ORDER — CYCLOBENZAPRINE HCL 10 MG PO TABS: 10.0000 mg | ORAL_TABLET | Freq: Two times a day (BID) | ORAL | 0 refills | Status: DC | PRN

## 2022-03-07 NOTE — ED Provider Notes (Signed)
[facility] CARE    CSN: [id number] Arrival date & time: 03/07/22  1418      History   Chief Complaint Chief Complaint  Patient presents with   Back Pain    Entered by patient    HPI Steven Torres is a 61 y.o. male.   Patient suddenly developed mild non-radiating left lower back pain 2 days ago.  He denies injury or recent changes in activity.   He denies bowel or bladder dysfunction, and no saddle numbness. He has a past history of L4-L5 microdiscectomy, and right L5 radiculitis successfully treated by Dr. Aundria Mems with L5-S1 transforaminal epidural. He states that he had similar mild pain in the distant past that was successfully treated with IM Toradol and muscle relaxant.  The history is provided by the patient.  Back Pain Location:  Lumbar spine Quality:  Aching Radiates to:  Does not radiate Pain severity:  Mild Pain is:  Same all the time Onset quality:  Sudden Duration:  2 days Timing:  Constant Progression:  Unchanged Chronicity:  Recurrent Context: not lifting heavy objects and not recent injury   Relieved by:  Nothing Worsened by:  Ambulation Ineffective treatments:  None tried Associated symptoms: no abdominal pain, no bladder incontinence, no bowel incontinence, no dysuria, no fever, no leg pain, no numbness, no paresthesias and no perianal numbness     Past Medical History:  Diagnosis Date   Allergy    Anxiety    Chronic kidney disease    GERD (gastroesophageal reflux disease)    History of kidney stones    Hyperlipidemia    Renal stone    Ulcerative colitis [facility])     Patient Active Problem List   Diagnosis Date Noted   Primary osteoarthritis of both knees 03/05/2022   Primary osteoarthritis of both hands 08/18/2017   Primary osteoarthritis of both feet 08/18/2017   DDD (degenerative disc disease), lumbar 08/18/2017   History of gastroesophageal reflux (GERD) 08/18/2017   Special screening for malignant neoplasms, colon  06/02/2016   Anxiety state 04/14/2016   Seasonal allergies 09/15/2015   Dyslipidemia 04/13/2015   Physical exam 03/31/2015   Left sided colitis ([place]) 03/13/2012    Past Surgical History:  Procedure Laterality Date   BACK SURGERY     EXTRACORPOREAL SHOCK WAVE LITHOTRIPSY Left 11/29/2016   Procedure: LEFT EXTRACORPOREAL SHOCK WAVE LITHOTRIPSY (ESWL);  Surgeon: Raynelle Bring, MD;  Location: [facility];  Service: Urology;  Laterality: Left;   HERNIA REPAIR     KNEE ARTHROSCOPY     2- both on right knee   WISDOM TOOTH EXTRACTION         Home Medications    Prior to Admission medications   Medication Sig Start Date End Date Taking? Authorizing Provider  acetaminophen (TYLENOL) 650 MG CR tablet Take 1 tablet (650 mg total) by mouth every 8 (eight) hours as needed for pain. 03/05/22  Yes Silverio Decamp, MD  aspirin 81 MG tablet Take 81 mg by mouth daily.   Yes [provider]  atorvastatin (LIPITOR) 10 MG tablet TAKE 1 TABLET BY MOUTH ONCE DAILY 10/12/21  Yes Midge Minium, MD  Cholecalciferol (VITAMIN D) 2000 UNITS CAPS Take 1 capsule by mouth daily.   Yes [provider]  cyclobenzaprine (FLEXERIL) 10 MG tablet Take 1 tablet (10 mg total) by mouth 2 (two) times daily as needed for muscle spasms. 03/07/22  Yes Kandra Nicolas, MD  GLUCOSAMINE-CHONDROITIN PO Take by mouth daily.  Yes [provider]  mesalamine (LIALDA) 1.2 g EC tablet Take 2 tablets (2.4 g total) by mouth daily with breakfast. 06/05/21  Yes Armbruster, Carlota Raspberry, MD  Multiple Vitamins-Minerals (MULTIVITAMIN ADULT PO) Take by mouth daily.   Yes [provider]  Omega-3 Fatty Acids (FISH OIL) 1360 MG CAPS Take by mouth daily.   Yes [provider]    Family History Family History  Problem Relation Age of Onset   Prostate cancer Father    Colon polyps Mother        benign   Heart attack Mother    Colon cancer Neg Hx    Rectal cancer Neg Hx    Stomach cancer Neg Hx     Esophageal cancer Neg Hx     Social History Social History   Tobacco Use   Smoking status: Never   Smokeless tobacco: Never  Vaping Use   Vaping Use: Never used  Substance Use Topics   Alcohol use: No   Drug use: No     Allergies   Patient has no known allergies.   Review of Systems Review of Systems  Constitutional:  Negative for activity change, appetite change, chills, diaphoresis, fatigue and fever.  Gastrointestinal:  Negative for abdominal pain and bowel incontinence.  Genitourinary:  Negative for bladder incontinence, dysuria and hematuria.  Musculoskeletal:  Positive for back pain.  Skin:  Negative for rash.  Neurological:  Negative for numbness and paresthesias.  All other systems reviewed and are negative.    Physical Exam Triage Vital Signs ED Triage Vitals  Enc Vitals Group     BP 03/07/22 1446 (!) 163/90     Pulse Rate 03/07/22 1446 74     Resp 03/07/22 1446 18     Temp 03/07/22 1446 98.7 F (37.1 C)     Temp src --      SpO2 03/07/22 1446 95 %     Weight 03/07/22 1444 200 lb (90.7 kg)     Height 03/07/22 1444 6' 1"  (1.854 m)     Head Circumference --      Peak Flow --      Pain Score 03/07/22 1444 6     Pain Loc --      Pain Edu? --      Excl. in [place]? --    No data found.  Updated Vital Signs BP (!) 163/90 (BP Location: Left Arm)   Pulse 74   Temp 98.7 F (37.1 C)   Resp 18   Ht 6' 1"  (1.854 m)   Wt 90.7 kg   SpO2 95%   BMI 26.39 kg/m   Visual Acuity Right Eye Distance:   Left Eye Distance:   Bilateral Distance:    Right Eye Near:   Left Eye Near:    Bilateral Near:     Physical Exam Vitals and nursing note reviewed.  Constitutional:      General: He is not in acute distress. HENT:     Head: Normocephalic.  Eyes:     Pupils: Pupils are equal, round, and reactive to light.  Cardiovascular:     Rate and Rhythm: Normal rate and regular rhythm.     Heart sounds: Normal heart sounds.  Pulmonary:     Breath sounds:  Normal breath sounds.  Abdominal:     Palpations: Abdomen is soft.     Tenderness: There is no abdominal tenderness.  Musculoskeletal:     Cervical back: Normal range of motion.  Back:     Right lower leg: No edema.     Left lower leg: No edema.     Comments: Back:  Range of motion relatively well preserved.  Can heel/toe walk and squat without difficulty.  Mild tenderness to palpation left back as noted on diagram.Straight leg raising test is slightly positive on the left at 45 degrees.  Sitting knee extension test is negative.  Strength and sensation in the lower extremities is normal.  Patellar and achilles reflexes are normal   Skin:    General: Skin is warm and dry.     Findings: No rash.  Neurological:     General: No focal deficit present.     Mental Status: He is alert.      UC Treatments / Results  Labs (all labs ordered are listed, but only abnormal results are displayed) Labs Reviewed - No data to display  EKG   Radiology No results found.  Procedures Procedures (including critical care time)  Medications Ordered in UC Medications  ketorolac (TORADOL) injection 60 mg (has no administration in time range)    Initial Impression / Assessment and Plan / UC Course  I have reviewed the triage vital signs and the nursing notes.  Pertinent labs & imaging results that were available during my care of the patient were reviewed by me and considered in my medical decision making (see chart for details).    Administered Toradol 65m IM.  Rx for Flexeril. [facility] if not [facility] about two weeks.   Final Clinical Impressions(s) / UC Diagnoses   Final diagnoses:  Acute left-sided low back pain without sciatica     Discharge Instructions      Apply ice pack for 20 to 30 minutes, 3 to 4 times daily  Continue until pain decreases. May take Tylenol as needed.    ED Prescriptions     Medication Sig  Dispense Auth. Provider   cyclobenzaprine (FLEXERIL) 10 MG tablet Take 1 tablet (10 mg total) by mouth 2 (two) times daily as needed for muscle spasms. 20 tablet BKandra Nicolas MD         BKandra Nicolas MD 03/08/22 [phone]

## 2022-03-07 NOTE — Discharge Instructions (Signed)
Apply ice pack for 20 to 30 minutes, 3 to 4 times daily  Continue until pain decreases. May take Tylenol as needed.

## 2022-03-07 NOTE — ED Triage Notes (Signed)
Lower back pain. X2 days

## 2022-03-09 ENCOUNTER — Other Ambulatory Visit (HOSPITAL_BASED_OUTPATIENT_CLINIC_OR_DEPARTMENT_OTHER): Payer: Self-pay

## 2022-03-15 ENCOUNTER — Encounter: Payer: Self-pay | Admitting: Family Medicine

## 2022-03-15 ENCOUNTER — Ambulatory Visit (INDEPENDENT_AMBULATORY_CARE_PROVIDER_SITE_OTHER): Payer: No Typology Code available for payment source | Admitting: Family Medicine

## 2022-03-15 ENCOUNTER — Emergency Department (HOSPITAL_BASED_OUTPATIENT_CLINIC_OR_DEPARTMENT_OTHER): Payer: No Typology Code available for payment source

## 2022-03-15 ENCOUNTER — Other Ambulatory Visit: Payer: Self-pay

## 2022-03-15 ENCOUNTER — Encounter (HOSPITAL_BASED_OUTPATIENT_CLINIC_OR_DEPARTMENT_OTHER): Payer: Self-pay

## 2022-03-15 ENCOUNTER — Observation Stay (HOSPITAL_BASED_OUTPATIENT_CLINIC_OR_DEPARTMENT_OTHER)
Admission: EM | Admit: 2022-03-15 | Discharge: 2022-03-16 | Disposition: A | Discharge status: Home / Self Care | Payer: No Typology Code available for payment source | Attending: Surgery | Admitting: Surgery

## 2022-03-15 ENCOUNTER — Ambulatory Visit: Payer: Self-pay

## 2022-03-15 VITALS — BP 118/72 | HR 93 | Temp 98.8°F | Ht 73.0 in | Wt 207.0 lb

## 2022-03-15 DIAGNOSIS — K3580 Unspecified acute appendicitis: Secondary | ICD-10-CM | POA: Diagnosis present

## 2022-03-15 DIAGNOSIS — N189 Chronic kidney disease, unspecified: Secondary | ICD-10-CM | POA: Diagnosis not present

## 2022-03-15 DIAGNOSIS — K353 Acute appendicitis with localized peritonitis, without perforation or gangrene: Secondary | ICD-10-CM | POA: Diagnosis not present

## 2022-03-15 DIAGNOSIS — Z8719 Personal history of other diseases of the digestive system: Secondary | ICD-10-CM

## 2022-03-15 DIAGNOSIS — Z9889 Other specified postprocedural states: Secondary | ICD-10-CM

## 2022-03-15 DIAGNOSIS — Z79899 Other long term (current) drug therapy: Secondary | ICD-10-CM | POA: Insufficient documentation

## 2022-03-15 DIAGNOSIS — Z7982 Long term (current) use of aspirin: Secondary | ICD-10-CM | POA: Diagnosis not present

## 2022-03-15 DIAGNOSIS — R1031 Right lower quadrant pain: Secondary | ICD-10-CM

## 2022-03-15 LAB — COMPREHENSIVE METABOLIC PANEL
ALT: 27 U/L (ref 0–53)
AST: 21 U/L (ref 0–37)
Albumin: 4.6 g/dL (ref 3.5–5.2)
Alkaline Phosphatase: 86 U/L (ref 39–117)
BUN: 14 mg/dL (ref 6–23)
CO2: 30 mEq/L (ref 19–32)
Calcium: 9.9 mg/dL (ref 8.4–10.5)
Chloride: 104 mEq/L (ref 96–112)
Creatinine, Ser: 1.12 mg/dL (ref 0.40–1.50)
GFR: 71 mL/min (ref >60.00)
Glucose, Bld: 116 mg/dL — ABNORMAL HIGH (ref 70–99)
Potassium: 4.9 mEq/L (ref 3.5–5.1)
Sodium: 141 mEq/L (ref 135–145)
Total Bilirubin: 1.2 mg/dL (ref 0.2–1.2)
Total Protein: 7.1 g/dL (ref 6.0–8.3)

## 2022-03-15 LAB — POCT URINALYSIS DIP (MANUAL ENTRY)
Bilirubin, UA: NEGATIVE
Blood, UA: NEGATIVE
Glucose, UA: NEGATIVE mg/dL
Ketones, POC UA: NEGATIVE mg/dL
Leukocytes, UA: NEGATIVE
Nitrite, UA: NEGATIVE
Protein Ur, POC: NEGATIVE mg/dL
Spec Grav, UA: >=1.03 — AB (ref 1.010–1.025)
Urobilinogen, UA: NEGATIVE E.U./dL — AB
pH, UA: 5 (ref 5.0–8.0)

## 2022-03-15 LAB — CBC
HCT: 44.5 % (ref 39.0–52.0)
Hemoglobin: 15 g/dL (ref 13.0–17.0)
MCHC: 33.6 g/dL (ref 30.0–36.0)
MCV: 94.4 fl (ref 78.0–100.0)
Platelets: 325 10*3/uL (ref 150.0–400.0)
RBC: 4.72 Mil/uL (ref 4.22–5.81)
RDW: 12.3 % (ref 11.5–15.5)
WBC: 16.1 10*3/uL — ABNORMAL HIGH (ref 4.0–10.5)

## 2022-03-15 MED ORDER — IOHEXOL 300 MG/ML  SOLN
100.0000 mL | Freq: Once | INTRAMUSCULAR | Status: AC | PRN
  Administered 2022-03-15: 80 mL via INTRAVENOUS

## 2022-03-15 MED ORDER — METRONIDAZOLE 500 MG/100ML IV SOLN
500.0000 mg | Freq: Once | INTRAVENOUS | Status: AC
  Administered 2022-03-16: 500 mg via INTRAVENOUS
  Filled 2022-03-15: qty 100

## 2022-03-15 MED ORDER — SODIUM CHLORIDE 0.9 % IV SOLN
1.0000 g | Freq: Once | INTRAVENOUS | Status: AC
  Administered 2022-03-16: 1 g via INTRAVENOUS
  Filled 2022-03-15: qty 10

## 2022-03-15 NOTE — Progress Notes (Signed)
Subjective:  Patient ID: Steven Torres, male    DOB: December 23, 1960  Age: 61 y.o. MRN: [id number]  CC:  Chief Complaint  Patient presents with   Abdominal Pain    Pt states h has abd pain on the right side, tender on the right side, pt states it started at 1am lastnight and the severe pain lasted 3.5 hours,pt states when he woke up he had heartburn so he took an acid reducer pill    HPI [name] presents for   Right-sided abdominal pain Ate  pizza 3 hrs prior to going to bed. Noted heartburn, indigestion at 1am, then all over stomach initially, then moved to R side this morning.  No n/v No fever.  No diarrhea, last BM 8:30 - normal. No melena/hematochezia.  No hematuria/urgency/dysuria.  No chest pains.  Feels 85% better now. Sore to press on area only.   Tx: otc pepcid early this am - helped heartburn.   On problem list review he has a history of left-sided colitis, ulcerative  gastroenterology Dr. Havery Moros, note reviewed from 06/05/2021, treated with Lialda. Has been well controlled. Current pain feels different.  No abdominal surgeries, other than R inguinal hernia repair years ago.   Prior back pain has improved, urgent care note reviewed From November 5.  Toradol Flexeril given at that time.  History Patient Active Problem List   Diagnosis Date Noted   Primary osteoarthritis of both knees 03/05/2022   Primary osteoarthritis of both hands 08/18/2017   Primary osteoarthritis of both feet 08/18/2017   DDD (degenerative disc disease), lumbar 08/18/2017   History of gastroesophageal reflux (GERD) 08/18/2017   Special screening for malignant neoplasms, colon 06/02/2016   Anxiety state 04/14/2016   Seasonal allergies 09/15/2015   Dyslipidemia 04/13/2015   Physical exam 03/31/2015   Left sided colitis ([place]) 03/13/2012   Past Medical History:  Diagnosis Date   Allergy    Anxiety    Chronic kidney disease    GERD (gastroesophageal reflux disease)    History of kidney  stones    Hyperlipidemia    Renal stone    Ulcerative colitis ([place])    Past Surgical History:  Procedure Laterality Date   BACK SURGERY     EXTRACORPOREAL SHOCK WAVE LITHOTRIPSY Left 11/29/2016   Procedure: LEFT EXTRACORPOREAL SHOCK WAVE LITHOTRIPSY (ESWL);  Surgeon: Raynelle Bring, MD;  Location: [facility];  Service: Urology;  Laterality: Left;   HERNIA REPAIR     KNEE ARTHROSCOPY     2- both on right knee   WISDOM TOOTH EXTRACTION     No Known Allergies Prior to Admission medications   Medication Sig Start Date End Date Taking? Authorizing Provider  aspirin 81 MG tablet Take 81 mg by mouth daily.   Yes [provider]  atorvastatin (LIPITOR) 10 MG tablet TAKE 1 TABLET BY MOUTH ONCE DAILY 10/12/21  Yes Midge Minium, MD  Cholecalciferol (VITAMIN D) 2000 UNITS CAPS Take 1 capsule by mouth daily.   Yes [provider]  GLUCOSAMINE-CHONDROITIN PO Take by mouth daily.   Yes [provider]  mesalamine (LIALDA) 1.2 g EC tablet Take 2 tablets (2.4 g total) by mouth daily with breakfast. 06/05/21  Yes Armbruster, Carlota Raspberry, MD  Multiple Vitamins-Minerals (MULTIVITAMIN ADULT PO) Take by mouth daily.   Yes [provider]  Omega-3 Fatty Acids (FISH OIL) 1360 MG CAPS Take by mouth daily.   Yes [provider]  acetaminophen (TYLENOL) 650 MG CR tablet Take 1  tablet (650 mg total) by mouth every 8 (eight) hours as needed for pain. Patient not taking: Reported on 03/15/2022 03/05/22   Silverio Decamp, MD  cyclobenzaprine (FLEXERIL) 10 MG tablet Take 1 tablet (10 mg total) by mouth 2 (two) times daily as needed for muscle spasms. Patient not taking: Reported on 03/15/2022 03/07/22   Kandra Nicolas, MD   Social History   Socioeconomic History   Marital status: Married    Spouse name: Not on file   Number of children: 2   Years of education: Not on file   Highest education level: Not on file  Occupational History   Occupation: Passenger transport manager   Tobacco Use   Smoking status: Never   Smokeless tobacco: Never  Vaping Use   Vaping Use: Never used  Substance and Sexual Activity   Alcohol use: No   Drug use: No   Sexual activity: Not on file  Other Topics Concern   Not on file  Social History Narrative   Not on file   Social Determinants of Health   Financial Resource Strain: Not on file  Food Insecurity: Not on file  Transportation Needs: Not on file  Physical Activity: Not on file  Stress: Not on file  Social Connections: Not on file  Intimate Partner Violence: Not on file    Review of Systems   Objective:   Vitals:   03/15/22 0909  BP: 118/72  Pulse: 93  Temp: 98.8 F (37.1 C)  SpO2: 97%  Weight: 207 lb (93.9 kg)  Height: 6' 1"  (1.854 m)     Physical Exam Vitals reviewed.  Constitutional:      Appearance: He is well-developed.  HENT:     Head: Normocephalic and atraumatic.  Neck:     Vascular: No carotid bruit or JVD.  Cardiovascular:     Rate and Rhythm: Normal rate and regular rhythm.     Heart sounds: Normal heart sounds. No murmur heard. Pulmonary:     Effort: Pulmonary effort is normal.     Breath sounds: Normal breath sounds. No rales.  Abdominal:     Tenderness: There is abdominal tenderness. There is no right CVA tenderness, left CVA tenderness, guarding or rebound. Positive signs include McBurney's sign (TTP RLQ, suprapubic.). Negative signs include Murphy's sign, psoas sign and obturator sign.  Musculoskeletal:     Right lower leg: No edema.     Left lower leg: No edema.  Skin:    General: Skin is warm and dry.  Neurological:     Mental Status: He is alert and oriented to person, place, and time.  Psychiatric:        Mood and Affect: Mood normal.    Results for orders placed or performed in visit on 03/15/22  POCT urinalysis dipstick  Result Value Ref Range   Color, UA yellow yellow   Clarity, UA clear clear   Glucose, UA negative negative mg/dL   Bilirubin, UA negative  negative   Ketones, POC UA negative negative mg/dL   Spec Grav, UA >=1.030 (A) 1.010 - 1.025   Blood, UA negative negative   pH, UA 5.0 5.0 - 8.0   Protein Ur, POC negative negative mg/dL   Urobilinogen, UA negative (A) 0.2 or 1.0 E.U./dL   Nitrite, UA Negative Negative   Leukocytes, UA Negative Negative     Assessment & Plan:  SATOSHI KALAS is a 61 y.o. male . RLQ abdominal pain - Plan: CBC, Comprehensive metabolic panel, POCT urinalysis  dipstick  History of colitis - Plan: CBC, Comprehensive metabolic panel, POCT urinalysis dipstick  Right lower quadrant abdominal pain, started this morning, some improvement at this time as above, still tender over the right lower quadrant.  Differential includes colitis with history of same, appendicitis, less likely nephroliths or urinary tract infection with normal in office urinalysis.  Check CBC, CMP.  Bland foods, fluids most important for now.  Consider CT if leukocytosis with ER precautions if any worsening pain today.  Understanding expressed.  No orders of the defined types were placed in this encounter.  Patient Instructions  I will check some blood work as well as urine testing for the abdominal pain.  If the blood count is elevated, we will likely schedule CT scan today.  Bland foods for now, Pepcid today is fine if that is helped the heartburn.  If any worsening pain, fevers, nausea or vomiting proceed to the emergency room as we discussed.  Please let me know if there are questions and we will talk to you once I receive some of your test results.   Return to the clinic or go to the nearest emergency room if any of your symptoms worsen or new symptoms occur.  Abdominal Pain, Adult Pain in the abdomen (abdominal pain) can be caused by many things. Often, abdominal pain is not serious and it gets better with no treatment or by being treated at home. However, sometimes abdominal pain is serious. Your health care provider will ask questions  about your medical history and do a physical exam to try to determine the cause of your abdominal pain. Follow these instructions at home: Medicines Take over-the-counter and prescription medicines only as told by your health care provider. Do not take a laxative unless told by your health care provider. General instructions  Watch your condition for any changes. Drink enough fluid to keep your urine pale yellow. Keep all follow-up visits as told by your health care provider. This is important. Contact a health care provider if: Your abdominal pain changes or gets worse. You are not hungry or you lose weight without trying. You are constipated or have diarrhea for more than 2-3 days. You have pain when you urinate or have a bowel movement. Your abdominal pain wakes you up at night. Your pain gets worse with meals, after eating, or with certain foods. You are vomiting and cannot keep anything down. You have a fever. You have blood in your urine. Get help right away if: Your pain does not go away as soon as your health care provider told you to expect. You cannot stop vomiting. Your pain is only in areas of the abdomen, such as the right side or the left lower portion of the abdomen. Pain on the right side could be caused by appendicitis. You have bloody or black stools, or stools that look like tar. You have severe pain, cramping, or bloating in your abdomen. You have signs of dehydration, such as: Dark urine, very little urine, or no urine. Cracked lips. Dry mouth. Sunken eyes. Sleepiness. Weakness. You have trouble breathing or chest pain. Summary Often, abdominal pain is not serious and it gets better with no treatment or by being treated at home. However, sometimes abdominal pain is serious. Watch your condition for any changes. Take over-the-counter and prescription medicines only as told by your health care provider. Contact a health care provider if your abdominal pain  changes or gets worse. Get help right away if you have severe  pain, cramping, or bloating in your abdomen. This information is not intended to replace advice given to you by your health care provider. Make sure you discuss any questions you have with your health care provider. Document Revised: 06/08/2019 Document Reviewed: 08/28/2018 Elsevier Patient Education  [place],   Merri Ray, MD [place], [facility] 03/15/22 10:08 AM

## 2022-03-15 NOTE — ED Provider Notes (Signed)
[facility] DEPT Provider Note   CSN: [id number] Arrival date & time: 03/15/22  1826     History  Chief Complaint  Patient presents with   Abdominal Pain    Steven Torres is a 61 y.o. male.  HPI 61 year old male presents with right lower quadrant pain and elevated WBC.  Around 1 AM he developed acute right lower quadrant abdominal pain.  It was pretty significant for about 3 hours or so.  Since then he has had some vague abdominal discomfort.  No fevers though he did have a temp up to 99.2 when he arrived.  At this point the pain is pretty benign though if he pushes on his abdomen or flexes his hip it hurts.  No back pain, vomiting, urinary symptoms or testicular pain.  He has ulcerative colitis but that typically presents on the left side and has never had pain like this.  No blood in the stools or diarrhea.  Home Medications Prior to Admission medications   Medication Sig Start Date End Date Taking? Authorizing Provider  acetaminophen (TYLENOL) 650 MG CR tablet Take 1 tablet (650 mg total) by mouth every 8 (eight) hours as needed for pain. Patient not taking: Reported on 03/15/2022 03/05/22   Silverio Decamp, MD  aspirin 81 MG tablet Take 81 mg by mouth daily.    [provider]  atorvastatin (LIPITOR) 10 MG tablet TAKE 1 TABLET BY MOUTH ONCE DAILY 10/12/21   Midge Minium, MD  Cholecalciferol (VITAMIN D) 2000 UNITS CAPS Take 1 capsule by mouth daily.    [provider]  cyclobenzaprine (FLEXERIL) 10 MG tablet Take 1 tablet (10 mg total) by mouth 2 (two) times daily as needed for muscle spasms. Patient not taking: Reported on 03/15/2022 03/07/22   Kandra Nicolas, MD  GLUCOSAMINE-CHONDROITIN PO Take by mouth daily.    [provider]  mesalamine (LIALDA) 1.2 g EC tablet Take 2 tablets (2.4 g total) by mouth daily with breakfast. 06/05/21   Armbruster, Carlota Raspberry, MD  Multiple Vitamins-Minerals (MULTIVITAMIN ADULT PO) Take by  mouth daily.    [provider]  Omega-3 Fatty Acids (FISH OIL) 1360 MG CAPS Take by mouth daily.    [provider]      Allergies    Patient has no known allergies.    Review of Systems   Review of Systems  Constitutional:  Negative for fever.  Gastrointestinal:  Positive for abdominal pain. Negative for blood in stool, diarrhea and vomiting.  Musculoskeletal:  Negative for back pain.    Physical Exam Updated Vital Signs BP 137/76   Pulse 73   Temp 99.2 F (37.3 C)   Resp 18   Ht 6' 1"  (1.854 m)   Wt 93.9 kg   SpO2 100%   BMI 27.31 kg/m  Physical Exam Vitals and nursing note reviewed.  Constitutional:      General: He is not in acute distress.    Appearance: He is well-developed. He is not ill-appearing or diaphoretic.  HENT:     Head: Normocephalic and atraumatic.  Cardiovascular:     Rate and Rhythm: Normal rate and regular rhythm.     Heart sounds: Normal heart sounds.  Pulmonary:     Effort: Pulmonary effort is normal.  Abdominal:     Palpations: Abdomen is soft.     Tenderness: There is abdominal tenderness (mild) in the right lower quadrant and left lower quadrant.  Skin:    General: Skin is  warm and dry.  Neurological:     Mental Status: He is alert.     ED Results / Procedures / Treatments   Labs (all labs ordered are listed, but only abnormal results are displayed) Labs Reviewed - No data to display  EKG None  Radiology No results found.  Procedures Procedures    Medications Ordered in ED Medications - No data to display  ED Course/ Medical Decision Making/ A&P                           Medical Decision Making Amount and/or Complexity of Data Reviewed Radiology: ordered.  Risk Prescription drug management.   Labs from PCP office reviewed today and he has an unremarkable CMP but does have a leukocytosis of 16,000.  Urinalysis without UTI.  Given location of his pain a CT was obtained. Care to Dr. Karle Starch with CT  pending.        Final Clinical Impression(s) / ED Diagnoses Final diagnoses:  None    Rx / DC Orders ED Discharge Orders     None         Sherwood Gambler, MD 03/15/22 [phone]

## 2022-03-15 NOTE — H&P (Signed)
Admitting Physician: Nickola Major Analeia Ismael  Service: General Surgery  CC: Abdominal pain  Subjective   HPI: Steven Torres is an 61 y.o. male who is here for abdominal pain.    Pain started 1PM last night.  First was all over, eventually was focused in the lower mid abdomen  Past Medical History:  Diagnosis Date   Allergy    Anxiety    Chronic kidney disease    GERD (gastroesophageal reflux disease)    History of kidney stones    Hyperlipidemia    Renal stone    Ulcerative colitis [facility])     Past Surgical History:  Procedure Laterality Date   BACK SURGERY     EXTRACORPOREAL SHOCK WAVE LITHOTRIPSY Left 11/29/2016   Procedure: LEFT EXTRACORPOREAL SHOCK WAVE LITHOTRIPSY (ESWL);  Surgeon: Raynelle Bring, MD;  Location: [facility];  Service: Urology;  Laterality: Left;   HERNIA REPAIR     KNEE ARTHROSCOPY     2- both on right knee   WISDOM TOOTH EXTRACTION      Family History  Problem Relation Age of Onset   Prostate cancer Father    Colon polyps Mother        benign   Heart attack Mother    Colon cancer Neg Hx    Rectal cancer Neg Hx    Stomach cancer Neg Hx    Esophageal cancer Neg Hx     Social:  reports that he has never smoked. He has never used smokeless tobacco. He reports that he does not drink alcohol and does not use drugs.  Allergies: No Known Allergies  Medications: Current Outpatient Medications  Medication Instructions   acetaminophen (TYLENOL) 650 mg, Oral, Every 8 hours PRN   aspirin 81 mg, Oral, Daily   atorvastatin (LIPITOR) 10 MG tablet TAKE 1 TABLET BY MOUTH ONCE DAILY   Cholecalciferol (VITAMIN D) 2000 UNITS CAPS 1 capsule, Oral, Daily   cyclobenzaprine (FLEXERIL) 10 mg, Oral, 2 times daily PRN   GLUCOSAMINE-CHONDROITIN PO Oral, Daily   Lialda 2.4 g, Oral, Daily with breakfast   Multiple Vitamins-Minerals (MULTIVITAMIN ADULT PO) Oral, Daily   Omega-3 Fatty Acids (FISH OIL) 1360 MG CAPS Oral, Daily    ROS - all of the below systems have been  reviewed with the patient and positives are indicated with bold text General: chills, fever or night sweats Eyes: blurry vision or double vision ENT: epistaxis or sore throat Allergy/Immunology: itchy/watery eyes or nasal congestion Hematologic/Lymphatic: bleeding problems, blood clots or swollen lymph nodes Endocrine: temperature intolerance or unexpected weight changes Breast: new or changing breast lumps or nipple discharge Resp: cough, shortness of breath, or wheezing CV: chest pain or dyspnea on exertion GI: as per HPI GU: dysuria, trouble voiding, or hematuria MSK: joint pain or joint stiffness Neuro: TIA or stroke symptoms Derm: pruritus and skin lesion changes Psych: anxiety and depression  Objective   PE Blood pressure 134/76, pulse 75, temperature 99.2 F (37.3 C), resp. rate 18, height 6' 1"  (1.854 m), weight 93.9 kg, SpO2 99 %. Constitutional: NAD; conversant; no deformities Eyes: Moist conjunctiva; no lid lag; anicteric; PERRL Neck: Trachea midline; no thyromegaly Lungs: Normal respiratory effort; no tactile fremitus CV: RRR; no palpable thrills; no pitting edema GI: Abd Tender lower abdomen; no palpable hepatosplenomegaly MSK: Normal range of motion of extremities; no clubbing/cyanosis Psychiatric: Appropriate affect; alert and oriented x3 Lymphatic: No palpable cervical or axillary lymphadenopathy  Results for orders placed or performed in visit on 03/15/22 (from the past  24 hour(s))  CBC     Status: Abnormal   Collection Time: 03/15/22  9:46 AM  Result Value Ref Range   WBC 16.1 (H) 4.0 - 10.5 K/uL   RBC 4.72 4.22 - 5.81 Mil/uL   Platelets 325.0 150.0 - 400.0 K/uL   Hemoglobin 15.0 13.0 - 17.0 g/dL   HCT 44.5 39.0 - 52.0 %   MCV 94.4 78.0 - 100.0 fl   MCHC 33.6 30.0 - 36.0 g/dL   RDW 12.3 11.5 - 15.5 %  Comprehensive metabolic panel     Status: Abnormal   Collection Time: 03/15/22  9:46 AM  Result Value Ref Range   Sodium 141 135 - 145 mEq/L   Potassium  4.9 3.5 - 5.1 mEq/L   Chloride 104 96 - 112 mEq/L   CO2 30 19 - 32 mEq/L   Glucose, Bld 116 (H) 70 - 99 mg/dL   BUN 14 6 - 23 mg/dL   Creatinine, Ser 1.12 0.40 - 1.50 mg/dL   Total Bilirubin 1.2 0.2 - 1.2 mg/dL   Alkaline Phosphatase 86 39 - 117 U/L   AST 21 0 - 37 U/L   ALT 27 0 - 53 U/L   Total Protein 7.1 6.0 - 8.3 g/dL   Albumin 4.6 3.5 - 5.2 g/dL   GFR 71.00 >60.00 mL/min   Calcium 9.9 8.4 - 10.5 mg/dL  POCT urinalysis dipstick     Status: Abnormal   Collection Time: 03/15/22  9:55 AM  Result Value Ref Range   Color, UA yellow yellow   Clarity, UA clear clear   Glucose, UA negative negative mg/dL   Bilirubin, UA negative negative   Ketones, POC UA negative negative mg/dL   Spec Grav, UA >=1.030 (A) 1.010 - 1.025   Blood, UA negative negative   pH, UA 5.0 5.0 - 8.0   Protein Ur, POC negative negative mg/dL   Urobilinogen, UA negative (A) 0.2 or 1.0 E.U./dL   Nitrite, UA Negative Negative   Leukocytes, UA Negative Negative     Imaging Orders         CT ABDOMEN PELVIS W CONTRAST      Assessment and Plan   Steven Torres is an 61 y.o. male with abdominal pain, found on CT to have appendicitis.   I recommended laparoscopic appendectomy.  The procedure, its risks, benefits and alternatives were discussed and the patient granted consent to proceed.  We will proceed to OR.   Felicie Morn, MD  [facility] Use [email or web address] to contact on call provider  New Patient Billing: [phone] - High MDM

## 2022-03-15 NOTE — ED Triage Notes (Signed)
RLQ sided abd pain since 0100 this morning.

## 2022-03-15 NOTE — Patient Instructions (Signed)
I will check some blood work as well as urine testing for the abdominal pain.  If the blood count is elevated, we will likely schedule CT scan today.  Bland foods for now, Pepcid today is fine if that is helped the heartburn.  If any worsening pain, fevers, nausea or vomiting proceed to the emergency room as we discussed.  Please let me know if there are questions and we will talk to you once I receive some of your test results.   Return to the clinic or go to the nearest emergency room if any of your symptoms worsen or new symptoms occur.  Abdominal Pain, Adult Pain in the abdomen (abdominal pain) can be caused by many things. Often, abdominal pain is not serious and it gets better with no treatment or by being treated at home. However, sometimes abdominal pain is serious. Your health care provider will ask questions about your medical history and do a physical exam to try to determine the cause of your abdominal pain. Follow these instructions at home: Medicines Take over-the-counter and prescription medicines only as told by your health care provider. Do not take a laxative unless told by your health care provider. General instructions  Watch your condition for any changes. Drink enough fluid to keep your urine pale yellow. Keep all follow-up visits as told by your health care provider. This is important. Contact a health care provider if: Your abdominal pain changes or gets worse. You are not hungry or you lose weight without trying. You are constipated or have diarrhea for more than 2-3 days. You have pain when you urinate or have a bowel movement. Your abdominal pain wakes you up at night. Your pain gets worse with meals, after eating, or with certain foods. You are vomiting and cannot keep anything down. You have a fever. You have blood in your urine. Get help right away if: Your pain does not go away as soon as your health care provider told you to expect. You cannot stop  vomiting. Your pain is only in areas of the abdomen, such as the right side or the left lower portion of the abdomen. Pain on the right side could be caused by appendicitis. You have bloody or black stools, or stools that look like tar. You have severe pain, cramping, or bloating in your abdomen. You have signs of dehydration, such as: Dark urine, very little urine, or no urine. Cracked lips. Dry mouth. Sunken eyes. Sleepiness. Weakness. You have trouble breathing or chest pain. Summary Often, abdominal pain is not serious and it gets better with no treatment or by being treated at home. However, sometimes abdominal pain is serious. Watch your condition for any changes. Take over-the-counter and prescription medicines only as told by your health care provider. Contact a health care provider if your abdominal pain changes or gets worse. Get help right away if you have severe pain, cramping, or bloating in your abdomen. This information is not intended to replace advice given to you by your health care provider. Make sure you discuss any questions you have with your health care provider. Document Revised: 06/08/2019 Document Reviewed: 08/28/2018 Elsevier Patient Education  [place].

## 2022-03-15 NOTE — ED Notes (Signed)
Pt states that he had labs drawn this morning at his PCP. Requesting to wait to draw more labs.

## 2022-03-15 NOTE — ED Provider Notes (Signed)
Care of the patient assumed at the change of shift pending CT for suspected appendicitis which was positive. No signs of rupture or abscess. I spoke with Dr. Thermon Leyland, General Surgery, who will plan to take him to the OR tonight. Requests he be sent to the [facility]. Abx ordered. Patient and wife aware of plan.    Truddie Hidden, MD 03/15/22 [phone]

## 2022-03-16 ENCOUNTER — Inpatient Hospital Stay (HOSPITAL_BASED_OUTPATIENT_CLINIC_OR_DEPARTMENT_OTHER): Payer: No Typology Code available for payment source | Admitting: Anesthesiology

## 2022-03-16 ENCOUNTER — Inpatient Hospital Stay (HOSPITAL_COMMUNITY): Payer: No Typology Code available for payment source | Admitting: Anesthesiology

## 2022-03-16 ENCOUNTER — Encounter (HOSPITAL_COMMUNITY): Admission: EM | Disposition: A | Discharge status: Home / Self Care | Payer: Self-pay | Source: Home / Self Care

## 2022-03-16 ENCOUNTER — Other Ambulatory Visit: Payer: Self-pay

## 2022-03-16 DIAGNOSIS — N189 Chronic kidney disease, unspecified: Secondary | ICD-10-CM | POA: Diagnosis not present

## 2022-03-16 DIAGNOSIS — K353 Acute appendicitis with localized peritonitis, without perforation or gangrene: Secondary | ICD-10-CM | POA: Diagnosis not present

## 2022-03-16 DIAGNOSIS — Z9889 Other specified postprocedural states: Secondary | ICD-10-CM

## 2022-03-16 DIAGNOSIS — K3580 Unspecified acute appendicitis: Secondary | ICD-10-CM | POA: Diagnosis present

## 2022-03-16 DIAGNOSIS — Z7982 Long term (current) use of aspirin: Secondary | ICD-10-CM | POA: Diagnosis not present

## 2022-03-16 DIAGNOSIS — R1031 Right lower quadrant pain: Secondary | ICD-10-CM | POA: Diagnosis present

## 2022-03-16 DIAGNOSIS — K37 Unspecified appendicitis: Secondary | ICD-10-CM

## 2022-03-16 DIAGNOSIS — Z79899 Other long term (current) drug therapy: Secondary | ICD-10-CM | POA: Diagnosis not present

## 2022-03-16 HISTORY — PX: LAPAROSCOPIC APPENDECTOMY: SHX408

## 2022-03-16 SURGERY — APPENDECTOMY, LAPAROSCOPIC
Anesthesia: General

## 2022-03-16 MED ORDER — HYDROMORPHONE HCL 1 MG/ML IJ SOLN
INTRAMUSCULAR | Status: AC
  Filled 2022-03-16: qty 1

## 2022-03-16 MED ORDER — OXYCODONE HCL 5 MG PO TABS
ORAL_TABLET | ORAL | Status: AC
  Filled 2022-03-16: qty 1

## 2022-03-16 MED ORDER — ONDANSETRON HCL 4 MG/2ML IJ SOLN: 4.0000 mg | Freq: Four times a day (QID) | INTRAMUSCULAR | Status: DC | PRN

## 2022-03-16 MED ORDER — ACETAMINOPHEN 325 MG PO TABS: 650.0000 mg | ORAL_TABLET | Freq: Four times a day (QID) | ORAL | Status: DC

## 2022-03-16 MED ORDER — OXYCODONE HCL 5 MG PO TABS
5.0000 mg | ORAL_TABLET | Freq: Once | ORAL | Status: AC | PRN
  Administered 2022-03-16: 5 mg via ORAL

## 2022-03-16 MED ORDER — LIDOCAINE HCL (CARDIAC) PF 100 MG/5ML IV SOSY
PREFILLED_SYRINGE | INTRAVENOUS | Status: DC | PRN
  Administered 2022-03-16: 20 mg via INTRAVENOUS

## 2022-03-16 MED ORDER — LACTATED RINGERS IV SOLN: INTRAVENOUS | Status: DC

## 2022-03-16 MED ORDER — FENTANYL CITRATE (PF) 250 MCG/5ML IJ SOLN
INTRAMUSCULAR | Status: DC | PRN
  Administered 2022-03-16: 100 ug via INTRAVENOUS

## 2022-03-16 MED ORDER — DEXAMETHASONE SODIUM PHOSPHATE 10 MG/ML IJ SOLN
INTRAMUSCULAR | Status: DC | PRN
  Administered 2022-03-16: 10 mg via INTRAVENOUS

## 2022-03-16 MED ORDER — PHENYLEPHRINE 80 MCG/ML (10ML) SYRINGE FOR IV PUSH (FOR BLOOD PRESSURE SUPPORT)
PREFILLED_SYRINGE | INTRAVENOUS | Status: AC
  Filled 2022-03-16: qty 30

## 2022-03-16 MED ORDER — HYDROMORPHONE HCL 1 MG/ML IJ SOLN
0.2500 mg | INTRAMUSCULAR | Status: DC | PRN
  Administered 2022-03-16 (×4): 0.5 mg via INTRAVENOUS

## 2022-03-16 MED ORDER — GABAPENTIN 600 MG PO TABS: 300.0000 mg | ORAL_TABLET | Freq: Three times a day (TID) | ORAL | Status: DC

## 2022-03-16 MED ORDER — OXYCODONE HCL 5 MG PO TABS: 5.0000 mg | ORAL_TABLET | ORAL | Status: DC | PRN

## 2022-03-16 MED ORDER — ENOXAPARIN SODIUM 40 MG/0.4ML IJ SOSY: 40.0000 mg | PREFILLED_SYRINGE | INTRAMUSCULAR | Status: DC

## 2022-03-16 MED ORDER — SUCCINYLCHOLINE CHLORIDE 200 MG/10ML IV SOSY
PREFILLED_SYRINGE | INTRAVENOUS | Status: AC
  Filled 2022-03-16: qty 10

## 2022-03-16 MED ORDER — OXYCODONE-ACETAMINOPHEN 5-325 MG PO TABS: 1.0000 | ORAL_TABLET | ORAL | 0 refills | Status: DC | PRN

## 2022-03-16 MED ORDER — BUPIVACAINE-EPINEPHRINE (PF) 0.25% -1:200000 IJ SOLN
INTRAMUSCULAR | Status: AC
  Filled 2022-03-16: qty 30

## 2022-03-16 MED ORDER — ROCURONIUM 10MG/ML (10ML) SYRINGE FOR MEDFUSION PUMP - OPTIME
INTRAVENOUS | Status: DC | PRN
  Administered 2022-03-16: 60 mg via INTRAVENOUS
  Administered 2022-03-16: 10 mg via INTRAVENOUS

## 2022-03-16 MED ORDER — PHENYLEPHRINE HCL (PRESSORS) 10 MG/ML IV SOLN
INTRAVENOUS | Status: DC | PRN
  Administered 2022-03-16: 160 ug via INTRAVENOUS
  Administered 2022-03-16: 80 ug via INTRAVENOUS

## 2022-03-16 MED ORDER — HYDROMORPHONE HCL 1 MG/ML IJ SOLN: 0.5000 mg | INTRAMUSCULAR | Status: DC | PRN

## 2022-03-16 MED ORDER — EPHEDRINE 5 MG/ML INJ
INTRAVENOUS | Status: AC
  Filled 2022-03-16: qty 5

## 2022-03-16 MED ORDER — PROMETHAZINE HCL 25 MG/ML IJ SOLN: 6.2500 mg | INTRAMUSCULAR | Status: DC | PRN

## 2022-03-16 MED ORDER — MIDAZOLAM HCL 2 MG/2ML IJ SOLN
INTRAMUSCULAR | Status: DC | PRN
  Administered 2022-03-16: 2 mg via INTRAVENOUS

## 2022-03-16 MED ORDER — FENTANYL CITRATE (PF) 250 MCG/5ML IJ SOLN
INTRAMUSCULAR | Status: AC
  Filled 2022-03-16: qty 5

## 2022-03-16 MED ORDER — ONDANSETRON HCL 4 MG/2ML IJ SOLN
INTRAMUSCULAR | Status: AC
  Filled 2022-03-16: qty 6

## 2022-03-16 MED ORDER — OXYCODONE HCL 5 MG PO TABS: 10.0000 mg | ORAL_TABLET | ORAL | Status: DC | PRN

## 2022-03-16 MED ORDER — METHOCARBAMOL 1000 MG/10ML IJ SOLN: 500.0000 mg | Freq: Four times a day (QID) | INTRAVENOUS | Status: DC | PRN

## 2022-03-16 MED ORDER — 0.9 % SODIUM CHLORIDE (POUR BTL) OPTIME
TOPICAL | Status: DC | PRN
  Administered 2022-03-16: 1000 mL

## 2022-03-16 MED ORDER — SUGAMMADEX SODIUM 500 MG/5ML IV SOLN
INTRAVENOUS | Status: AC
  Filled 2022-03-16: qty 5

## 2022-03-16 MED ORDER — MIDAZOLAM HCL 2 MG/2ML IJ SOLN: 0.5000 mg | Freq: Once | INTRAMUSCULAR | Status: DC | PRN

## 2022-03-16 MED ORDER — MIDAZOLAM HCL 2 MG/2ML IJ SOLN
INTRAMUSCULAR | Status: AC
  Filled 2022-03-16: qty 2

## 2022-03-16 MED ORDER — SUGAMMADEX SODIUM 200 MG/2ML IV SOLN
INTRAVENOUS | Status: DC | PRN
  Administered 2022-03-16: 200 mg via INTRAVENOUS

## 2022-03-16 MED ORDER — OXYCODONE HCL 5 MG/5ML PO SOLN: 5.0000 mg | Freq: Once | ORAL | Status: AC | PRN

## 2022-03-16 MED ORDER — ETOMIDATE 2 MG/ML IV SOLN
INTRAVENOUS | Status: AC
  Filled 2022-03-16: qty 10

## 2022-03-16 MED ORDER — ONDANSETRON HCL 4 MG/2ML IJ SOLN
INTRAMUSCULAR | Status: DC | PRN
  Administered 2022-03-16: 4 mg via INTRAVENOUS

## 2022-03-16 MED ORDER — BUPIVACAINE-EPINEPHRINE 0.25% -1:200000 IJ SOLN
INTRAMUSCULAR | Status: DC | PRN
  Administered 2022-03-16: 30 mL

## 2022-03-16 MED ORDER — MEPERIDINE HCL 25 MG/ML IJ SOLN: 6.2500 mg | INTRAMUSCULAR | Status: DC | PRN

## 2022-03-16 MED ORDER — LACTATED RINGERS IV SOLN: INTRAVENOUS | Status: DC | PRN

## 2022-03-16 MED ORDER — PROPOFOL 10 MG/ML IV BOLUS
INTRAVENOUS | Status: DC | PRN
  Administered 2022-03-16: 200 mg via INTRAVENOUS

## 2022-03-16 MED ORDER — DEXAMETHASONE SODIUM PHOSPHATE 10 MG/ML IJ SOLN
INTRAMUSCULAR | Status: AC
  Filled 2022-03-16: qty 3

## 2022-03-16 MED ORDER — LIDOCAINE 2% (20 MG/ML) 5 ML SYRINGE
INTRAMUSCULAR | Status: AC
  Filled 2022-03-16: qty 15

## 2022-03-16 MED ORDER — SODIUM CHLORIDE 0.9 % IR SOLN
Status: DC | PRN
  Administered 2022-03-16: 1000 mL

## 2022-03-16 MED ORDER — ROCURONIUM BROMIDE 10 MG/ML (PF) SYRINGE
PREFILLED_SYRINGE | INTRAVENOUS | Status: AC
  Filled 2022-03-16: qty 10

## 2022-03-16 MED ORDER — SIMETHICONE 80 MG PO CHEW: 80.0000 mg | CHEWABLE_TABLET | Freq: Four times a day (QID) | ORAL | Status: DC | PRN

## 2022-03-16 MED ORDER — PROCHLORPERAZINE EDISYLATE 10 MG/2ML IJ SOLN: 10.0000 mg | INTRAMUSCULAR | Status: DC | PRN

## 2022-03-16 SURGICAL SUPPLY — 53 items
ADH SKN CLS APL DERMABOND .7 (GAUZE/BANDAGES/DRESSINGS) ×1
APL PRP STRL LF DISP 70% ISPRP (MISCELLANEOUS) ×1
APPLIER CLIP 5 13 M/L LIGAMAX5 (MISCELLANEOUS)
APR CLP MED LRG 5 ANG JAW (MISCELLANEOUS)
BAG COUNTER SPONGE SURGICOUNT (BAG) ×1 IMPLANT
BAG SPEC RTRVL LRG 6X4 10 (ENDOMECHANICALS) ×1
BAG SPNG CNTER NS LX DISP (BAG) ×1
BLADE CLIPPER SURG (BLADE) IMPLANT
CANISTER SUCT 3000ML PPV (MISCELLANEOUS) ×1 IMPLANT
CHLORAPREP W/TINT 26 (MISCELLANEOUS) ×1 IMPLANT
CLIP APPLIE 5 13 M/L LIGAMAX5 (MISCELLANEOUS) IMPLANT
COVER SURGICAL LIGHT HANDLE (MISCELLANEOUS) ×1 IMPLANT
CUTTER FLEX LINEAR 45M (STAPLE) ×1 IMPLANT
DERMABOND ADVANCED .7 DNX12 (GAUZE/BANDAGES/DRESSINGS) ×1 IMPLANT
ELECT REM PT RETURN 9FT ADLT (ELECTROSURGICAL) ×1
ELECTRODE REM PT RTRN 9FT ADLT (ELECTROSURGICAL) ×1 IMPLANT
GLOVE BIOGEL PI IND STRL 8.5 (GLOVE) IMPLANT
GLOVE ECLIPSE 8.5 STRL (GLOVE) IMPLANT
GLOVE INDICATOR 6.5 STRL GRN (GLOVE) ×1 IMPLANT
GLOVE SURG ENC MOIS LTX SZ7.5 (GLOVE) ×1 IMPLANT
GLOVE SURG UNDER LTX SZ8 (GLOVE) ×1 IMPLANT
GOWN STRL REUS W/ TWL LRG LVL3 (GOWN DISPOSABLE) ×2 IMPLANT
GOWN STRL REUS W/ TWL XL LVL3 (GOWN DISPOSABLE) ×1 IMPLANT
GOWN STRL REUS W/TWL LRG LVL3 (GOWN DISPOSABLE) ×2
GOWN STRL REUS W/TWL XL LVL3 (GOWN DISPOSABLE) ×1
GRASPER SUT TROCAR 14GX15 (MISCELLANEOUS) ×1 IMPLANT
KIT BASIN OR (CUSTOM PROCEDURE TRAY) ×1 IMPLANT
KIT TURNOVER KIT B (KITS) ×1 IMPLANT
NDL 22X1.5 STRL (OR ONLY) (MISCELLANEOUS) ×1 IMPLANT
NDL INSUFFLATION 14GA 120MM (NEEDLE) ×1 IMPLANT
NEEDLE 22X1.5 STRL (OR ONLY) (MISCELLANEOUS) ×1 IMPLANT
NEEDLE INSUFFLATION 14GA 120MM (NEEDLE) ×1 IMPLANT
NS IRRIG 1000ML POUR BTL (IV SOLUTION) ×1 IMPLANT
PAD ARMBOARD 7.5X6 YLW CONV (MISCELLANEOUS) ×2 IMPLANT
POUCH SPECIMEN RETRIEVAL 10MM (ENDOMECHANICALS) ×1 IMPLANT
RELOAD 45 VASCULAR/THIN (ENDOMECHANICALS) ×1 IMPLANT
RELOAD STAPLE 45 2.5 WHT GRN (ENDOMECHANICALS) IMPLANT
RELOAD STAPLE 45 3.5 BLU ETS (ENDOMECHANICALS) IMPLANT
RELOAD STAPLE TA45 3.5 REG BLU (ENDOMECHANICALS) IMPLANT
SCISSORS LAP 5X35 DISP (ENDOMECHANICALS) IMPLANT
SET IRRIG TUBING LAPAROSCOPIC (IRRIGATION / IRRIGATOR) ×1 IMPLANT
SET TUBE SMOKE EVAC HIGH FLOW (TUBING) ×1 IMPLANT
SHEARS HARMONIC ACE PLUS 36CM (ENDOMECHANICALS) ×1 IMPLANT
SLEEVE ENDOPATH XCEL 5M (ENDOMECHANICALS) ×1 IMPLANT
SPECIMEN JAR SMALL (MISCELLANEOUS) ×1 IMPLANT
SUT MNCRL AB 4-0 PS2 18 (SUTURE) ×1 IMPLANT
TOWEL GREEN STERILE FF (TOWEL DISPOSABLE) ×1 IMPLANT
TRAY FOLEY W/BAG SLVR 16FR (SET/KITS/TRAYS/PACK)
TRAY FOLEY W/BAG SLVR 16FR ST (SET/KITS/TRAYS/PACK) ×1 IMPLANT
TRAY LAPAROSCOPIC MC (CUSTOM PROCEDURE TRAY) ×1 IMPLANT
TROCAR XCEL 12X100 BLDLESS (ENDOMECHANICALS) ×1 IMPLANT
TROCAR XCEL NON-BLD 5MMX100MML (ENDOMECHANICALS) ×1 IMPLANT
WATER STERILE IRR 1000ML POUR (IV SOLUTION) ×1 IMPLANT

## 2022-03-16 NOTE — ED Provider Notes (Signed)
Patient transferred from [place] [facility] for surgical evaluation for acute uncomplicated appendicitis.  At time of ED arrival patient symptoms are managed.  General surgeon alerted of patient's arrival. Plan to transfer to OR for ongoing care.    Quintella Reichert, MD 03/16/22 [phone]

## 2022-03-16 NOTE — Anesthesia Procedure Notes (Signed)
Procedure Name: Intubation Date/Time: 03/16/2022 1:47 AM  Performed by: Valetta Fuller, CRNAPre-anesthesia Checklist: Patient identified, Emergency Drugs available, Suction available and Patient being monitored Patient Re-evaluated:Patient Re-evaluated prior to induction Oxygen Delivery Method: Circle system utilized Preoxygenation: Pre-oxygenation with 100% oxygen Induction Type: IV induction Ventilation: Mask ventilation without difficulty Laryngoscope Size: Miller and 2 Grade View: Grade I Tube type: Oral Tube size: 7.5 mm Number of attempts: 1 Airway Equipment and Method: Stylet Placement Confirmation: ETT inserted through vocal cords under direct vision, positive ETCO2 and breath sounds checked- equal and bilateral Secured at: 23 cm Tube secured with: Tape Dental Injury: Teeth and Oropharynx as per pre-operative assessment

## 2022-03-16 NOTE — Op Note (Signed)
Patient: Steven Torres (December 30, 1960, [id number])  Date of Surgery: 03/16/22  Preoperative Diagnosis: appendicitis   Postoperative Diagnosis: appendicitis   Surgical Procedure: APPENDECTOMY LAPAROSCOPIC:    Operative Team Members:  Surgeon(s) and Role:    * Farrin Shadle, Nickola Major, MD - Primary   Anesthesiologist: Annye Asa, MD CRNA: Valetta Fuller, CRNA   Anesthesia: General   Fluids:  Total I/O In: 92.7 [IV VHQIONGEX:52.8] Out: -   Complications: None  Drains:  none   Specimen:  ID Type Source Tests Collected by Time Destination  1 : acute appendicitis GI Appendix SURGICAL PATHOLOGY Anvita Hirata, Nickola Major, MD 03/16/2022 0218      Disposition:  PACU - hemodynamically stable.  Plan of Care: Discharge to home after PACU    Indications for Procedure: TURON KILMER is a 61 y.o. male who presented with abdominal pain.  History, physical and imaging was concerning for appendicitis, so laparoscopic appendectomy was recommended for the patient.  The procedure itself, as well as the risks, benefits and alternatives were discussed with the patient.  Risks discussed included but were not limited to the risk of bleeding, infection, damage to nearby structures, need to convert to open procedure, incisional hernia, and the need for additional procedures or surgeries.  With this discussion complete and all questions answered the patient granted consent to proceed.  Findings: Inflamed appendix  Infection status: Patient: [facility] Patient Case: Emergent Infection Present At Time Of Surgery (PATOS):  Inflamed non-perforated appendix   Description of Procedure:   On the date stated above, the patient was taken to the operating room suite and placed in supine positioning with the left arm tucked.  Sequential compression devices were placed on the lower extremities to prevent blood clots.  General endotracheal anesthesia was induced.  The  patient urinated just prior to surgery so a foley catheter was not placed.  Preoperative antibiotics were given.  The patient's abdomen was prepped and draped in the usual sterile fashion.  A time-out was completed verifying the correct patient, procedure, positioning and equipment needed for the case.  We began by anesthetizing the skin with local anesthetic and then making a 12 mm incision just below the umbilicus.  We dissected through the subcutaneous tissues to the fascia.  There was a fat containing umbilical hernia. I placed a 39m trocar through this and insufflated the abdomen.  I then placed two additional 575mtrocars in the left lower quadrant and suprapubic region and the initial umbilical trocar was upgraded to a 12 mm trocar.  There was no trauma to the underlying viscera with initial trocar placement.  Any abnormal findings, other than inflammation in the right lower quadrant, are listed above in the findings section.   The patient was then placed in head down, left side down positioning.  The appendix was identified and dissected free from its attachments to the abdominal wall, small intestine and cecum.  A window was created in the mesoappendix using blunt dissection.  We used one 45 mm white load of the endoscopic linear stapler to divide the base of the appendix from the cecum.  Then the harmonic scalpel was used to divide the mesoappendix.  The appendix was placed in an endocatch bag and removed through the 12 mm port site.  The operative field was dry on final inspection. The staple line was well formed.  There was good hemostasis at the end of the case.  At this point we directed our attention  to closure.  The patient was moved back to a level position.  The 12 mm trocar site was closed at the fascial level using an 0-vicryl on a fascial suture passer.  The abdomen was desufflated.  The skin was closed using 4-0 Monocryl and dermabond.  All sponge and needle counts were correct at the end of  the case.    Louanna Raw, MD General, Bariatric, & Minimally Invasive Surgery [facility], [place]

## 2022-03-16 NOTE — Anesthesia Preprocedure Evaluation (Addendum)
Anesthesia Evaluation  Patient identified by MRN, date of birth, ID band Patient awake    Reviewed: Allergy & Precautions, NPO status , Patient's Chart, lab work & pertinent test results  History of Anesthesia Complications Negative for: history of anesthetic complications  Airway Mallampati: I  TM Distance: >3 FB Neck ROM: Full    Dental  (+) Dental Advisory Given, Missing   Pulmonary neg pulmonary ROS   breath sounds clear to auscultation       Cardiovascular (-) angina negative cardio ROS  Rhythm:Regular Rate:Normal     Neuro/Psych   Anxiety     negative neurological ROS     GI/Hepatic Neg liver ROS, PUD,GERD  Controlled,,Acute appy   Endo/Other  negative endocrine ROS    Renal/GU stones     Musculoskeletal  (+) Arthritis ,    Abdominal   Peds  Hematology negative hematology ROS (+)   Anesthesia Other Findings   Reproductive/Obstetrics                             Anesthesia Physical Anesthesia Plan  ASA: 2 and emergent  Anesthesia Plan: General   Post-op Pain Management: Ofirmev IV (intra-op)*   Induction: Intravenous  PONV Risk Score and Plan: 2 and Ondansetron and Dexamethasone  Airway Management Planned: Oral ETT  Additional Equipment: None  Intra-op Plan:   Post-operative Plan: Extubation in OR  Informed Consent: I have reviewed the patients History and Physical, chart, labs and discussed the procedure including the risks, benefits and alternatives for the proposed anesthesia with the patient or authorized representative who has indicated his/her understanding and acceptance.     Dental advisory given  Plan Discussed with: CRNA and Surgeon  Anesthesia Plan Comments:         Anesthesia Quick Evaluation

## 2022-03-16 NOTE — Anesthesia Postprocedure Evaluation (Signed)
Anesthesia Post Note  Patient: Steven Torres  Procedure(s) Performed: APPENDECTOMY LAPAROSCOPIC     Patient location during evaluation: PACU Anesthesia Type: General Level of consciousness: awake and alert, patient cooperative and oriented Pain management: pain level controlled Vital Signs Assessment: post-procedure vital signs reviewed and stable Respiratory status: spontaneous breathing, nonlabored ventilation and respiratory function stable Cardiovascular status: blood pressure returned to baseline and stable Postop Assessment: no apparent nausea or vomiting Anesthetic complications: no   No notable events documented.  Last Vitals:  Vitals:   03/16/22 0107 03/16/22 0303  BP: (!) 143/75 (!) 153/72  Pulse: 76 93  Resp: 20 16  Temp: 37.3 C 36.4 C  SpO2: 99% 97%    Last Pain:  Vitals:   03/16/22 0107  TempSrc: Oral  PainSc: 0-No pain                 Roderica Cathell,E. Nesa Distel

## 2022-03-16 NOTE — Transfer of Care (Signed)
Immediate Anesthesia Transfer of Care Note  Patient: Steven Torres  Procedure(s) Performed: APPENDECTOMY LAPAROSCOPIC  Patient Location: PACU  Anesthesia Type:General  Level of Consciousness: awake, alert , and oriented  Airway & Oxygen Therapy: Patient Spontanous Breathing  Post-op Assessment: Report given to RN and Post -op Vital signs reviewed and stable  Post vital signs: Reviewed and stable  Last Vitals:  Vitals Value Taken Time  BP 153/72   Temp    Pulse 90 03/16/22 0304  Resp 15 03/16/22 0304  SpO2 97 % 03/16/22 0304  Vitals shown include unvalidated device data.  Last Pain:  Vitals:   03/16/22 0107  TempSrc: Oral  PainSc: 0-No pain         Complications: No notable events documented.

## 2022-03-16 NOTE — ED Notes (Signed)
Pt arrived via [employer] from [place].  Pt started having sharp abdominal pain yesterday morning, pain went away, then pain started again.  Pt CT shows appendicitis Pt reporting 0/10 pain  Vitals stable  20RAC Antibiotics infusing

## 2022-03-16 NOTE — ED Notes (Signed)
Called [facility] to transport patient to [facility] emergency to see general surgery for an appendicitis--Dr. Randal Buba excepting

## 2022-03-16 NOTE — Discharge Summary (Signed)
  Patient ID: Steven Torres [id number] 61 y.o. August 17, 1960  03/15/2022  Discharge date and time: 03/16/2022  Admitting Physician: Steven Torres  Discharge Physician: Steven Torres  Admission Diagnoses: Acute appendicitis [K35.80] Status post surgery [Z98.890] Acute appendicitis with localized peritonitis, without perforation, abscess, or gangrene [K35.30] Patient Active Problem List   Diagnosis Date Noted   Status post surgery 03/16/2022   Acute appendicitis 03/16/2022   Primary osteoarthritis of both knees 03/05/2022   Primary osteoarthritis of both hands 08/18/2017   Primary osteoarthritis of both feet 08/18/2017   DDD (degenerative disc disease), lumbar 08/18/2017   History of gastroesophageal reflux (GERD) 08/18/2017   Special screening for malignant neoplasms, colon 06/02/2016   Anxiety state 04/14/2016   Seasonal allergies 09/15/2015   Dyslipidemia 04/13/2015   Physical exam 03/31/2015   Left sided colitis ([place]) 03/13/2012     Discharge Diagnoses: Appendicitis Patient Active Problem List   Diagnosis Date Noted   Status post surgery 03/16/2022   Acute appendicitis 03/16/2022   Primary osteoarthritis of both knees 03/05/2022   Primary osteoarthritis of both hands 08/18/2017   Primary osteoarthritis of both feet 08/18/2017   DDD (degenerative disc disease), lumbar 08/18/2017   History of gastroesophageal reflux (GERD) 08/18/2017   Special screening for malignant neoplasms, colon 06/02/2016   Anxiety state 04/14/2016   Seasonal allergies 09/15/2015   Dyslipidemia 04/13/2015   Physical exam 03/31/2015   Left sided colitis ([place]) 03/13/2012    Operations: Procedure(s): APPENDECTOMY LAPAROSCOPIC  Admission Condition: good  Discharged Condition: good  Indication for Admission: Appendicitis  Hospital Course: Steven Torres presented with appendicitis, underwent laparoscopic appendectomy, and was discharged.  Consults: None  Significant Diagnostic  Studies: none  Treatments: surgery: as above  Disposition: Home  Patient Instructions:  Allergies as of 03/16/2022   No Known Allergies      Medication List     TAKE these medications    acetaminophen 650 MG CR tablet Commonly known as: TYLENOL Take 1 tablet (650 mg total) by mouth every 8 (eight) hours as needed for pain.   atorvastatin 10 MG tablet Commonly known as: LIPITOR TAKE 1 TABLET BY MOUTH ONCE DAILY What changed:  how much to take how to take this when to take this   cyclobenzaprine 10 MG tablet Commonly known as: FLEXERIL Take 1 tablet (10 mg total) by mouth 2 (two) times daily as needed for muscle spasms.   GLUCOSAMINE-CHONDROITIN PO Take 1 tablet by mouth daily.   Lialda 1.2 g EC tablet Generic drug: mesalamine Take 2 tablets (2.4 g total) by mouth daily with breakfast.   MULTIVITAMIN ADULT PO Take by mouth daily.   oxyCODONE-acetaminophen 5-325 MG tablet Commonly known as: Percocet Take 1 tablet by mouth every 4 (four) hours as needed for severe pain.   Vitamin D 50 MCG (2000 UT) Caps Take 1 capsule by mouth daily.        Activity: no heavy lifting for 4 weeks Diet: regular diet Wound Care: keep wound clean and dry  Follow-up:  With Dr. Thermon Torres in 4 weeks.  Signed: Nickola Major Lasondra Torres General, Bariatric, & Minimally Invasive Surgery [facility], [place]   03/16/2022, 9:30 AM

## 2022-03-16 NOTE — Discharge Instructions (Signed)
APPENDECTOMY POST OPERATIVE INSTRUCTIONS  Thinking Clearly  The anesthesia may cause you to feel different for 1 or 2 days. Do not drive, drink alcohol, or make any big decisions for at least 2 days.  Nutrition When you wake up, you will be able to drink small amounts of liquid. If you do not feel sick, you can slowly advance your diet to regular foods. Continue to drink lots of fluids, usually about 8 to 10 glasses per day. Eat a high-fiber diet so you don't strain during bowel movements. High-Fiber Foods Foods high in fiber include beans, bran cereals and whole-grain breads, peas, dried fruit (figs, apricots, and dates), raspberries, blackberries, strawberries, sweet corn, broccoli, baked potatoes with skin, plums, pears, apples, greens, and nuts. Activity Slowly increase your activity. Be sure to get up and walk every hour or so to prevent blood clots. No heavy lifting or strenuous activity for 4 weeks following surgery to prevent hernias at your incision sites It is normal to feel tired. You may need more sleep than usual.  Get your rest but make sure to get up and move around frequently to prevent blood clots and pneumonia.  Work and Return to [employer] can go back to work when you feel well enough. Discuss the timing with your surgeon. You can usually go back to school or work 1 week or less after an operation for an unruptured appendix and up to 2 weeks after a ruptured appendix. If your work requires heavy lifting or strenuous activity you need to be placed on light duty for 4 weeks following surgery. You can return to gym class, sports or other physical activities 4 weeks after surgery.  Wound Care Always wash your hands before and after touching near your incision site. Do not soak in a bathtub until cleared at your follow up appointment. You may take a shower 24 hours after surgery. A small amount of drainage from the incision is normal. If the drainage is thick and yellow or  the site is red, you may have an infection, so call your surgeon. If you have a drain in one of your incisions, it will be taken out in office when the drainage stops. Steri-Strips will fall off in 7 to 10 days or they will be removed during your first office visit. If you have dermabond glue covering over the incision, allow the glue to flake off on its own. Avoid wearing tight or rough clothing. It may rub your incisions and make it harder for them to heal. Protect the new skin, especially from the sun. The sun can burn and cause darker scarring. Your scar will heal in about 4 to 6 weeks and will become softer and continue to fade over the next year.  The cosmetic appearance of the incisions will improve over the course of the first year after surgery. Sensation around your incision will return in a few weeks or months.  Bowel Movements After intestinal surgery, you may have loose watery stools for several days. If watery diarrhea lasts longer than 3 days, contact your surgeon. Pain medication (narcotics) can cause constipation. Increase the fiber in your diet with high-fiber foods if you are constipated. You can take an over the counter stool softener like Colace to avoid constipation.  Additional over the counter medications can also be used if Colace isn't sufficient (for example, Milk of Magnesia or Miralax).  Pain The amount of pain is different for each person. Some people need only 1 to  3 doses of pain control medication, while others need more. Take alternating doses of tylenol and ibuprofen around the clock for the first five days following surgery.  This will provide a baseline of pain control and help with inflammation.  Take the narcotic pain medication in addition if needed for severe pain.  Contact Your Surgeon at [phone], if you have: Pain that will not go away Pain that gets worse A fever of more than 101F (38.3C) Repeated vomiting Swelling, redness, bleeding, or  bad-smelling drainage from your wound site Strong abdominal pain No bowel movement or unable to pass gas for 3 days Watery diarrhea lasting longer than 3 days  Pain Control The goal of pain control is to minimize pain, keep you moving and help you heal. Your surgical team will work with you on your pain plan. Most often a combination of therapies and medications are used to control your pain. You may also be given medication (local anesthetic) at the surgical site. This may help control your pain for several days. Extreme pain puts extra stress on your body at a time when your body needs to focus on healing. Do not wait until your pain has reached a level "10" or is unbearable before telling your doctor or nurse. It is much easier to control pain before it becomes severe. Following a laparoscopic procedure, pain is sometimes felt in the shoulder. This is due to the gas inserted into your abdomen during the procedure. Moving and walking helps to decrease the gas and the right shoulder pain.  Use the guide below for ways to manage your post-operative pain. Learn more by going to facs.org/safepaincontrol.  How Intense Is My Pain Common Therapies to Feel Better       I hardly notice my pain, and it does not interfere with my activities.  I notice my pain and it distracts me, but I can still do activities (sitting up, walking, standing).  Non-Medication Therapies  Ice (in a bag, applied over clothing at the surgical site), elevation, rest, meditation, massage, distraction (music, TV, play) walking and mild exercise Splinting the abdomen with pillows +  Non-Opioid Medications Acetaminophen (Tylenol) Non-steroidal anti-inflammatory drugs (NSAIDS) Aspirin, Ibuprofen (Motrin, Advil) Naproxen (Aleve) Take these as needed, when you feel pain. Both acetaminophen and NSAIDs help to decrease pain and swelling (inflammation).      My pain is hard to ignore and is more noticeable even when I  rest.  My pain interferes with my usual activities.  Non-Medication Therapies  +  Non-Opioid medications  Take on a regular schedule (around-the-clock) instead of as needed. (For example, Tylenol every 6 hours at 9:00 am, 3:00 pm, 9:00 pm, 3:00 am and Motrin every 6 hours at 12:00 am, 6:00 am, 12:00 pm, 6:00 pm)         I am focused on my pain, and I am not doing my daily activities.  I am groaning in pain, and I cannot sleep. I am unable to do anything.  My pain is as bad as it could be, and nothing else matters.  Non-Medication Therapies  +  Around-the-Clock Non-Opioid Medications  +  Short-acting opioids  Opioids should be used with other medications to manage severe pain. Opioids block pain and give a feeling of euphoria (feel high). Addiction, a serious side effect of opioids, is rare with short-term (a few days) use.  Examples of short-acting opioids include: Tramadol (Ultram), Hydrocodone (Norco, Vicodin), Hydromorphone (Dilaudid), Oxycodone (Oxycontin)     The above  directions have been adapted from the [employer] Surgical Patient Education Program.  Please refer to the ACS website if needed: StatOfficial.co.za.   Louanna Raw, MD [facility] [address], [place], [place], [place]  [address] ?  [address], [place], [place]   [address] [phone] ? [phone] ? FAX [phone] Web site: [email or web address]

## 2022-03-16 NOTE — ED Notes (Signed)
Per Dr Ralene Bathe they are ready for patient in [place] - secretary to call to see where pt is going

## 2022-03-17 ENCOUNTER — Encounter (HOSPITAL_COMMUNITY): Payer: Self-pay | Admitting: Surgery

## 2022-03-17 LAB — SURGICAL PATHOLOGY

## 2022-03-29 ENCOUNTER — Ambulatory Visit: Payer: No Typology Code available for payment source | Admitting: Sports Medicine

## 2022-04-14 ENCOUNTER — Other Ambulatory Visit: Payer: Self-pay

## 2022-05-18 ENCOUNTER — Other Ambulatory Visit: Payer: Self-pay | Admitting: Family Medicine

## 2022-05-18 ENCOUNTER — Encounter: Payer: Self-pay | Admitting: Gastroenterology

## 2022-05-19 ENCOUNTER — Other Ambulatory Visit (HOSPITAL_BASED_OUTPATIENT_CLINIC_OR_DEPARTMENT_OTHER): Payer: Self-pay

## 2022-05-19 MED ORDER — ATORVASTATIN CALCIUM 10 MG PO TABS
10.0000 mg | ORAL_TABLET | Freq: Every day | ORAL | 6 refills | Status: DC
  Filled 2022-05-19: qty 90, 90d supply, fill #0
  Filled 2022-08-15: qty 90, 90d supply, fill #1
  Filled 2022-11-10: qty 30, 30d supply, fill #2

## 2022-05-27 ENCOUNTER — Encounter: Payer: Self-pay | Admitting: Gastroenterology

## 2022-06-02 ENCOUNTER — Other Ambulatory Visit: Payer: Self-pay | Admitting: Gastroenterology

## 2022-06-02 ENCOUNTER — Other Ambulatory Visit (HOSPITAL_BASED_OUTPATIENT_CLINIC_OR_DEPARTMENT_OTHER): Payer: Self-pay

## 2022-06-02 MED ORDER — MESALAMINE 1.2 G PO TBEC
2.4000 g | DELAYED_RELEASE_TABLET | Freq: Every day | ORAL | 0 refills | Status: DC
  Filled 2022-06-02: qty 180, 90d supply, fill #0

## 2022-07-14 ENCOUNTER — Encounter: Payer: Self-pay | Admitting: Gastroenterology

## 2022-07-14 ENCOUNTER — Ambulatory Visit: Payer: 59 | Admitting: Gastroenterology

## 2022-07-14 VITALS — BP 150/92 | HR 80 | Ht 73.0 in | Wt 209.8 lb

## 2022-07-14 DIAGNOSIS — K5130 Ulcerative (chronic) rectosigmoiditis without complications: Secondary | ICD-10-CM

## 2022-07-14 NOTE — Progress Notes (Signed)
HPI :  62 y/o male with a history of left sided ulcerative colitis, here for a follow up visit.  UC HISTORY: Left sided colitis diagnosed in 2013-2014. No prior hospitalization. He had been taking Lialda historically, but had been off it roughly 2015 or so and resumed after colonoscopy in Jan 2018 which showed some moderate to mild inflammation in the left colon.   SINCE LAST VISIT:  He has been doing really well since have last seen him about a year ago.  On 2 tabs of Lialda daily, has been on that since 2018 and tolerates it well.  He denies any problems with his bowels.  No urgency.  No bleeding.  No abdominal pain.  He feels at baseline.  His last colonoscopy was in 2020, no obvious active inflammation but biopsies showed some mildly active colitis.  He also had 2 small sessile serrated polyps removed at that time.  He had a fecal calprotectin in 2021 which was normal.  We had ordered one for last year but he had a busy time in his life and was not able to follow-up to have it done.  He denies any changes in his medical history of any significance since have seen him.  He had an appendectomy in November and recovered from that.  Denies any cardiopulmonary symptoms, no cardiac history. He denies any family history of colon cancer.  We discussed timing of next surveillance colonoscopy    Prior workup:  Colonoscopy 06/02/16 - isolated ileal ulceration, mild to moderate inflammation in the rectum / sigmoid colon, with cecal patch. 74m polyp, diverticulosis. Path with chronic active inflammation. Polyp was adenoma   Colonoscopy 10/12/18 - The perianal and digital rectal examinations were normal. - The terminal ileum appeared normal. - A 4 mm polyp was found in the cecum. The polyp was flat. The polyp was removed with a cold snare. Resection and retrieval were complete. - A 5 mm polyp was found in the transverse colon. The polyp was flat. The polyp was removed with a cold snare. Resection and  retrieval were complete. - A few small-mouthed diverticula were found in the sigmoid colon. - The colon was tortuous. - Internal hemorrhoids were found during retroflexion. - The exam was otherwise without abnormality. No active inflammation appreciated. Good control of colitis. - Biopsies were taken with a cold forceps in the rectum, in the sigmoid colon and in the descending colon for histology.   1. Surgical [P], transverse, cecal, polyp (2) - SESSILE SERRATED POLYP(S) WITHOUT CYTOLOGIC DYSPLASIA 2. Surgical [P], left colon BX - PATCHY, MILDLY ACTIVE CHRONIC COLITIS, CONSISTENT WITH PATIENT'S CLINICAL HISTORY OF INFLAMMATORY BOWEL DISEASE. - NEGATIVE FOR GRANULOMAS OR DYSPLASIA   Fecal calprotectin 03/14/20 - 38   Past Medical History:  Diagnosis Date   Allergy    Anxiety    Chronic kidney disease    GERD (gastroesophageal reflux disease)    History of kidney stones    Hyperlipidemia    Renal stone    Ulcerative colitis ([place]      Past Surgical History:  Procedure Laterality Date   APPENDECTOMY     BACK SURGERY     EXTRACORPOREAL SHOCK WAVE LITHOTRIPSY Left 11/29/2016   Procedure: LEFT EXTRACORPOREAL SHOCK WAVE LITHOTRIPSY (ESWL);  Surgeon: BRaynelle Bring MD;  Location: [facility];  Service: Urology;  Laterality: Left;   HERNIA REPAIR     KNEE ARTHROSCOPY     2- both on right knee   LAPAROSCOPIC APPENDECTOMY N/A 03/16/2022  Procedure: APPENDECTOMY LAPAROSCOPIC;  Surgeon: Felicie Morn, MD;  Location: [facility];  Service: General;  Laterality: N/A;   WISDOM TOOTH EXTRACTION     Family History  Problem Relation Age of Onset   Prostate cancer Father    Colon polyps Mother        benign   Heart attack Mother    Colon cancer Neg Hx    Rectal cancer Neg Hx    Stomach cancer Neg Hx    Esophageal cancer Neg Hx    Social History   Tobacco Use   Smoking status: Never   Smokeless tobacco: Never  Vaping Use   Vaping Use: Never used  Substance Use Topics    Alcohol use: No   Drug use: No   Current Outpatient Medications  Medication Sig Dispense Refill   acetaminophen (TYLENOL) 650 MG CR tablet Take 1 tablet (650 mg total) by mouth every 8 (eight) hours as needed for pain. 90 tablet 3   atorvastatin (LIPITOR) 10 MG tablet Take 1 tablet (10 mg total) by mouth daily. 30 tablet 6   Cholecalciferol (VITAMIN D) 2000 UNITS CAPS Take 1 capsule by mouth daily.     GLUCOSAMINE-CHONDROITIN PO Take 1 tablet by mouth daily.     mesalamine (LIALDA) 1.2 g EC tablet Take 2 tablets (2.4 g total) by mouth daily with breakfast. Please keep you March appointment for further refills 180 tablet 0   Multiple Vitamins-Minerals (MULTIVITAMIN ADULT PO) Take by mouth daily.     No current facility-administered medications for this visit.   No Known Allergies   Review of Systems: All systems reviewed and negative except where noted in HPI.   Lab Results  Component Value Date   WBC 16.1 (H) 03/15/2022   HGB 15.0 03/15/2022   HCT 44.5 03/15/2022   MCV 94.4 03/15/2022   PLT 325.0 03/15/2022    Lab Results  Component Value Date   CREATININE 1.12 03/15/2022   BUN 14 03/15/2022   NA 141 03/15/2022   K 4.9 03/15/2022   CL 104 03/15/2022   CO2 30 03/15/2022    Lab Results  Component Value Date   ALT 27 03/15/2022   AST 21 03/15/2022   ALKPHOS 86 03/15/2022   BILITOT 1.2 03/15/2022     Physical Exam: BP (!) 150/92   Pulse 80   Ht '6\' 1"'$  (1.854 m)   Wt 209 lb 12.8 oz (95.2 kg)   BMI 27.68 kg/m  Constitutional: Pleasant,well-developed, male in no acute distress. Neurological: Alert and oriented to person place and time. Psychiatric: Normal mood and affect. Behavior is normal.   ASSESSMENT: 62 y.o. male here for assessment of the following  1. 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  Doing clinically quite well on Lialda.  Has had a few polyps removed on his last exam and on his last few  colonoscopies has had some mild inflammatory changes.  Given he clinically does well we have kept him on Lialda, no flares since have seen him.  We discussed timing of his next surveillance colonoscopy and if he wanted to have that this year, which I think is reasonable given results of his prior testing chronicity of his colitis.  After discussion of colonoscopy and what it entails he wants to proceed with it.  Will tentatively scheduled to have that done in June.  He will continue Lialda for now.  Will call in the interim with any flares  or concerning symptoms.  PLAN: - continue Lialda - generic working okay will continue that - schedule surveillance colonoscopy at the [facility] - he wishes to do it in June  Jolly Mango, MD [facility]

## 2022-07-14 NOTE — Patient Instructions (Signed)
You have been scheduled for a colonoscopy on Friday, 10-29-22 at 10:30am. Plan to arrive at 9:30am. Please follow written instructions given to you at your previsit.  Please pick up your prep supplies at the pharmacy within the next 1-3 days of your previsit If you use inhalers (even only as needed), please bring them with you on the day of your procedure.   You have been scheduled for a Nurse Previsit appointment on Tuesday, 6-11 at at 1:30pm to go over instructions for your colonoscopy. This will be a telephone visit.  Thank you for entrusting me with your care and for choosing [facility], Dr. Groveton Cellar    If your blood pressure at your visit was 140/90 or greater, please contact your primary care physician to follow up on this.  _______________________________________________________  If you are age 62 or older, your body mass index should be between 23-30. Your Body mass index is 27.68 kg/m. If this is out of the aforementioned range listed, please consider follow up with your Primary Care Provider.  If you are age 62 or younger, your body mass index should be between 19-25. Your Body mass index is 27.68 kg/m. If this is out of the aformentioned range listed, please consider follow up with your Primary Care Provider.   ________________________________________________________  The  GI providers would like to encourage you to use [facility] to communicate with providers for non-urgent requests or questions.  Due to long hold times on the telephone, sending your provider a message by [facility] may be a faster and more efficient way to get a response.  Please allow 48 business hours for a response.  Please remember that this is for non-urgent requests.  _______________________________________________________  Due to recent changes in healthcare laws, you may see the results of your imaging and laboratory studies on MyChart before your provider has had a chance to review  them.  We understand that in some cases there may be results that are confusing or concerning to you. Not all laboratory results come back in the same time frame and the provider may be waiting for multiple results in order to interpret others.  Please give [place] 48 hours in order for your provider to thoroughly review all the results before contacting the office for clarification of your results.

## 2022-08-15 ENCOUNTER — Other Ambulatory Visit (HOSPITAL_BASED_OUTPATIENT_CLINIC_OR_DEPARTMENT_OTHER): Payer: Self-pay

## 2022-09-01 ENCOUNTER — Encounter (HOSPITAL_BASED_OUTPATIENT_CLINIC_OR_DEPARTMENT_OTHER): Payer: Self-pay | Admitting: Orthopaedic Surgery

## 2022-09-01 ENCOUNTER — Other Ambulatory Visit (HOSPITAL_BASED_OUTPATIENT_CLINIC_OR_DEPARTMENT_OTHER): Payer: Self-pay

## 2022-09-01 ENCOUNTER — Ambulatory Visit (HOSPITAL_BASED_OUTPATIENT_CLINIC_OR_DEPARTMENT_OTHER): Payer: 59 | Admitting: Orthopaedic Surgery

## 2022-09-01 ENCOUNTER — Other Ambulatory Visit: Payer: Self-pay | Admitting: Gastroenterology

## 2022-09-01 DIAGNOSIS — G8929 Other chronic pain: Secondary | ICD-10-CM

## 2022-09-01 DIAGNOSIS — M25561 Pain in right knee: Secondary | ICD-10-CM | POA: Diagnosis not present

## 2022-09-01 DIAGNOSIS — M1712 Unilateral primary osteoarthritis, left knee: Secondary | ICD-10-CM

## 2022-09-01 MED ORDER — TRIAMCINOLONE ACETONIDE 40 MG/ML IJ SUSP
80.0000 mg | INTRAMUSCULAR | Status: AC | PRN
  Administered 2022-09-01: 80 mg via INTRA_ARTICULAR

## 2022-09-01 MED ORDER — LIDOCAINE HCL 1 % IJ SOLN
4.0000 mL | INTRAMUSCULAR | Status: AC | PRN
  Administered 2022-09-01: 4 mL

## 2022-09-01 MED ORDER — MESALAMINE 1.2 G PO TBEC
2.4000 g | DELAYED_RELEASE_TABLET | Freq: Every day | ORAL | 1 refills | Status: DC
  Filled 2022-09-01: qty 180, 90d supply, fill #0
  Filled 2022-12-13: qty 180, 90d supply, fill #1

## 2022-09-01 NOTE — Progress Notes (Signed)
Chief Complaint: Bilateral knee pain     History of Present Illness:    Steven Torres is a 62 y.o. male presents today for ongoing bilateral knee pain he states that the right knee has been painful for many years and the left knee has been only more painful since last November.  He has had a previous history of 2 right knee surgeries.  He has had multiple injections including steroid and gel in the past.  None of which have provided significant relief.  Currently is on his feet all day at work and is having a very difficult time with pain in both knees.  Right pain has been more persistent and lasting for longer duration of time.    Surgical History:   As above  PMH/PSH/Family History/Social History/Meds/Allergies:    Past Medical History:  Diagnosis Date  . Allergy   . Anxiety   . Chronic kidney disease   . GERD (gastroesophageal reflux disease)   . History of kidney stones   . Hyperlipidemia   . Renal stone   . Ulcerative colitis [facility])    Past Surgical History:  Procedure Laterality Date  . APPENDECTOMY    . BACK SURGERY    . EXTRACORPOREAL SHOCK WAVE LITHOTRIPSY Left 11/29/2016   Procedure: LEFT EXTRACORPOREAL SHOCK WAVE LITHOTRIPSY (ESWL);  Surgeon: Heloise Purpura, MD;  Location: [facility];  Service: Urology;  Laterality: Left;  . HERNIA REPAIR    . KNEE ARTHROSCOPY     2- both on right knee  . LAPAROSCOPIC APPENDECTOMY N/A 03/16/2022   Procedure: APPENDECTOMY LAPAROSCOPIC;  Surgeon: Stechschulte, Hyman Hopes, MD;  Location: [facility];  Service: General;  Laterality: N/A;  . WISDOM TOOTH EXTRACTION     Social History   Socioeconomic History  . Marital status: Married    Spouse name: Not on file  . Number of children: 2  . Years of education: Not on file  . Highest education level: Not on file  Occupational History  . Occupation: Scientist, water quality  Tobacco Use  . Smoking status: Never  . Smokeless tobacco: Never  Vaping Use  . Vaping  Use: Never used  Substance and Sexual Activity  . Alcohol use: No  . Drug use: No  . Sexual activity: Not on file  Other Topics Concern  . Not on file  Social History Narrative  . Not on file   Social Determinants of Health   Financial Resource Strain: Not on file  Food Insecurity: Not on file  Transportation Needs: Not on file  Physical Activity: Not on file  Stress: Not on file  Social Connections: Not on file   Family History  Problem Relation Age of Onset  . Prostate cancer Father   . Colon polyps Mother        benign  . Heart attack Mother   . Colon cancer Neg Hx   . Rectal cancer Neg Hx   . Stomach cancer Neg Hx   . Esophageal cancer Neg Hx    No Known Allergies Current Outpatient Medications  Medication Sig Dispense Refill  . acetaminophen (TYLENOL) 650 MG CR tablet Take 1 tablet (650 mg total) by mouth every 8 (eight) hours as needed for pain. 90 tablet 3  . atorvastatin (LIPITOR) 10 MG tablet Take 1 tablet (10 mg total) by mouth  daily. 30 tablet 6  . Cholecalciferol (VITAMIN D) 2000 UNITS CAPS Take 1 capsule by mouth daily.    Marland Kitchen GLUCOSAMINE-CHONDROITIN PO Take 1 tablet by mouth daily.    . mesalamine (LIALDA) 1.2 g EC tablet Take 2 tablets (2.4 g total) by mouth daily with breakfast. Please keep you March appointment for further refills 180 tablet 0  . Multiple Vitamins-Minerals (MULTIVITAMIN ADULT PO) Take by mouth daily.     No current facility-administered medications for this visit.   No results found.  Review of Systems:   A ROS was performed including pertinent positives and negatives as documented in the HPI.  Physical Exam :   Constitutional: NAD and appears stated age Neurological: Alert and oriented Psych: Appropriate affect and cooperative There were no vitals taken for this visit.   Comprehensive Musculoskeletal Exam:    Right knee with range of motion from 0 to 120 degrees with crepitus.  There is medial lateral joint line pain as well as  patellofemoral joint line pain.  No effusion  Left knee with range of motion from 0 to 130 degrees.  Predominantly medial joint line pain.  No crepitus.  Negative McMurray.  Negative Lachman   Imaging:   Xray (4 views left knee, 4 views right knee): Advanced right knee osteoarthritis as well as mild osteoarthritis of the left knee    I personally reviewed and interpreted the radiographs.   Assessment:   62 y.o. male with right worse than left knee osteoarthritis.  At today's visit I did discuss treatment options.  Given the fact that his left knee was only more recently flared up I do believe that an ultrasound-guided injection to potentially get him significant relief.  With regard to the left knee we did also discuss additional injectable options including PRP.  With his right knee arthritis is significant as today as I do believe he would benefit from referral for possible discussion of knee arthroplasty.  This time we will plan to refer him to Dr. Roda Shutters for discussion of right knee arthroplasty  Plan :    -Left knee ultrasound-guided injection performed after verbal consent obtained     Procedure Note  Patient: Steven Torres             Date of Birth: 1960-06-08           MRN: [id number]             Visit Date: 09/01/2022  Procedures: Visit Diagnoses:  1. Chronic pain of right knee     Large Joint Inj: L knee on 09/01/2022 4:11 PM Indications: pain Details: 22 G 1.5 in needle, ultrasound-guided anterior approach  Arthrogram: No  Medications: 4 mL lidocaine 1 %; 80 mg triamcinolone acetonide 40 MG/ML Outcome: tolerated well, no immediate complications Procedure, treatment alternatives, risks and benefits explained, specific risks discussed. Consent was given by the patient. Immediately prior to procedure a time out was called to verify the correct patient, procedure, equipment, support staff and site/side marked as required. Patient was prepped and draped in the usual sterile  fashion.       I personally saw and evaluated the patient, and participated in the management and treatment plan.  Huel Cote, MD Attending Physician, Orthopedic Surgery  This document was dictated using Dragon voice recognition software. A reasonable attempt at proof reading has been made to minimize errors.

## 2022-09-07 ENCOUNTER — Ambulatory Visit: Payer: 59 | Admitting: Orthopaedic Surgery

## 2022-09-07 ENCOUNTER — Encounter: Payer: Self-pay | Admitting: Orthopaedic Surgery

## 2022-09-07 VITALS — Ht 73.0 in | Wt 198.0 lb

## 2022-09-07 DIAGNOSIS — M1711 Unilateral primary osteoarthritis, right knee: Secondary | ICD-10-CM | POA: Diagnosis not present

## 2022-09-07 NOTE — Progress Notes (Signed)
Office Visit Note   Patient: Steven Torres           Date of Birth: [birth year]           MRN: [id number] Visit Date: 09/07/2022              Requested by: Huel Cote, MD [address] [place],  [place] [address] PCP: Sheliah Hatch, MD   Assessment & Plan: Visit Diagnoses:  1. Primary osteoarthritis of right knee     Plan: Impression is 62 year old gentleman with advanced bone-on-bone varus DJD of the right knee.  Treatment options were again reviewed and patient is leaning towards right total knee replacement for sometime at the end of the year.  He will have to make arrangements first.  Risk benefits prognosis reviewed.  Questions encouraged and answered.  Eunice Blase will call the patient to confirm surgery time.  Impression is severe right knee degenerative joint disease secondary to Osteoarthritis.  Bone on bone joint space narrowing is seen on radiographs with mild varus alignment.  At this point, conservative treatments fail to provide any significant relief and the pain is severely affecting ADLs and quality of life.  Based on treatment options, the patient has elected to move forward with a knee replacement.  We have discussed the surgical risks that include but are not limited to infection, DVT, leg length discrepancy, stiffness, numbness, tingling, incomplete relief of pain.  Recovery and prognosis were also reviewed.    Anticoagulants: aspirin 81 mg daily Postop anticoagulation: Aspirin 81 mg Diabetic: No  Nickel allergy: No Prior DVT/PE: No Tobacco use: No Clearances needed for surgery: None Anticipated discharge dispo: Home   Follow-Up Instructions: No follow-ups on file.   Orders:  No orders of the defined types were placed in this encounter.  No orders of the defined types were placed in this encounter.     Procedures: No procedures performed   Clinical Data: No additional findings.   Subjective: Chief Complaint  Patient presents with    Right Knee - Pain    HPI Steven Torres is a very pleasant 62 year old gentleman referral from Dr. Morey Hummingbird for consultation for right knee replacement.  He has pain all over his knee that is worse with activity.  He has had 2 knee scopes in the past.  He has had steroid injections in the past as well.  His pain is limiting his quality of life and daily activities. Review of Systems  Constitutional: Negative.   HENT: Negative.    Eyes: Negative.   Respiratory: Negative.    Cardiovascular: Negative.   Gastrointestinal: Negative.   Endocrine: Negative.   Genitourinary: Negative.   Skin: Negative.   Allergic/Immunologic: Negative.   Neurological: Negative.   Hematological: Negative.   Psychiatric/Behavioral: Negative.    All other systems reviewed and are negative.    Objective: Vital Signs: Ht 6\' 1"  (1.854 m)   Wt 198 lb (89.8 kg)   BMI 26.12 kg/m   Physical Exam Vitals and nursing note reviewed.  Constitutional:      Appearance: He is well-developed.  HENT:     Head: Normocephalic and atraumatic.  Eyes:     Pupils: Pupils are equal, round, and reactive to light.  Pulmonary:     Effort: Pulmonary effort is normal.  Abdominal:     Palpations: Abdomen is soft.  Musculoskeletal:        General: Normal range of motion.     Cervical back: Neck supple.  Skin:  General: Skin is warm.  Neurological:     Mental Status: He is alert and oriented to person, place, and time.  Psychiatric:        Behavior: Behavior normal.        Thought Content: Thought content normal.        Judgment: Judgment normal.     Ortho Exam Examination of right knee shows medial joint line tenderness.  Collaterals and cruciates are stable.  Pain and crepitus with range of motion. Specialty Comments:  No specialty comments available.  Imaging: No results found.   PMFS History: Patient Active Problem List   Diagnosis Date Noted   Primary osteoarthritis of right knee 09/07/2022   Status post  surgery 03/16/2022   Acute appendicitis 03/16/2022   Primary osteoarthritis of both knees 03/05/2022   Primary osteoarthritis of both hands 08/18/2017   Primary osteoarthritis of both feet 08/18/2017   DDD (degenerative disc disease), lumbar 08/18/2017   History of gastroesophageal reflux (GERD) 08/18/2017   Special screening for malignant neoplasms, colon 06/02/2016   Anxiety state 04/14/2016   Seasonal allergies 09/15/2015   Dyslipidemia 04/13/2015   Physical exam 03/31/2015   Left sided colitis ([facility]) 03/13/2012   Past Medical History:  Diagnosis Date   Allergy    Anxiety    Chronic kidney disease    GERD (gastroesophageal reflux disease)    History of kidney stones    Hyperlipidemia    Renal stone    Ulcerative colitis ([facility])     Family History  Problem Relation Age of Onset   Prostate cancer Father    Colon polyps Mother        benign   Heart attack Mother    Colon cancer Neg Hx    Rectal cancer Neg Hx    Stomach cancer Neg Hx    Esophageal cancer Neg Hx     Past Surgical History:  Procedure Laterality Date   APPENDECTOMY     BACK SURGERY     EXTRACORPOREAL SHOCK WAVE LITHOTRIPSY Left 11/29/2016   Procedure: LEFT EXTRACORPOREAL SHOCK WAVE LITHOTRIPSY (ESWL);  Surgeon: Heloise Purpura, MD;  Location: [facility];  Service: Urology;  Laterality: Left;   HERNIA REPAIR     KNEE ARTHROSCOPY     2- both on right knee   LAPAROSCOPIC APPENDECTOMY N/A 03/16/2022   Procedure: APPENDECTOMY LAPAROSCOPIC;  Surgeon: Quentin Ore, MD;  Location: [facility];  Service: General;  Laterality: N/A;   WISDOM TOOTH EXTRACTION     Social History   Occupational History   Occupation: Scientist, water quality  Tobacco Use   Smoking status: Never   Smokeless tobacco: Never  Vaping Use   Vaping Use: Never used  Substance and Sexual Activity   Alcohol use: No   Drug use: No   Sexual activity: Not on file

## 2022-09-27 DIAGNOSIS — H43811 Vitreous degeneration, right eye: Secondary | ICD-10-CM | POA: Diagnosis not present

## 2022-09-29 ENCOUNTER — Ambulatory Visit (INDEPENDENT_AMBULATORY_CARE_PROVIDER_SITE_OTHER): Payer: 59 | Admitting: Ophthalmology

## 2022-09-29 ENCOUNTER — Encounter (INDEPENDENT_AMBULATORY_CARE_PROVIDER_SITE_OTHER): Payer: Self-pay | Admitting: Ophthalmology

## 2022-09-29 DIAGNOSIS — H25813 Combined forms of age-related cataract, bilateral: Secondary | ICD-10-CM | POA: Diagnosis not present

## 2022-09-29 DIAGNOSIS — H43813 Vitreous degeneration, bilateral: Secondary | ICD-10-CM

## 2022-09-29 NOTE — Progress Notes (Signed)
[facility] - Clinic Note  09/29/2022   CHIEF COMPLAINT Patient presents for Retina Evaluation  HISTORY OF PRESENT ILLNESS: Steven Torres is a 62 y.o. male who presents to the clinic today for:  HPI     Retina Evaluation   This started 2 days ago.  Duration of 2 days.  Associated Symptoms Floaters.  I, the attending physician,  performed the HPI with the patient and updated documentation appropriately.        Comments   Retina eval per Dr Charlotte Sanes pt is reporting on Sunday started seeing flashes and floaters in the right eye pt is reporting no vision changes noticed       Last edited by Rennis Chris, MD on 09/29/2022 12:48 PM.    Pt is here on the referral of Dr. Charlotte Sanes for fol and floaters OD, pt states on Sunday he was watching TV and getting ready to go to bed when he started seeing fol in his upper right quadrant of his right eye, he saw Dr. Charlotte Sanes on call on [facility], he states he is still seeing the fol, but they are a little better, he states he has had floaters prior to this, he denies being diabetic or hypertensive, he is a regular pt of Dr. Randon Goldsmith, he last saw him in November / December 2023   Referring physician: Maris Berger, MD [address] [place],  [place] [address]  HISTORICAL INFORMATION:  Selected notes from the MEDICAL RECORD NUMBER Referred by Dr. Charlotte Sanes for fol and floaters OD ([facility] call pt) LEE:  Ocular Hx- PMH-   CURRENT MEDICATIONS: No current outpatient medications on file. (Ophthalmic Drugs)   No current facility-administered medications for this visit. (Ophthalmic Drugs)   Current Outpatient Medications (Other)  Medication Sig   acetaminophen (TYLENOL) 650 MG CR tablet Take 1 tablet (650 mg total) by mouth every 8 (eight) hours as needed for pain.   atorvastatin (LIPITOR) 10 MG tablet Take 1 tablet (10 mg total) by mouth daily.   Cholecalciferol (VITAMIN D) 2000 UNITS CAPS Take 1 capsule by mouth daily.    GLUCOSAMINE-CHONDROITIN PO Take 1 tablet by mouth daily.   mesalamine (LIALDA) 1.2 g EC tablet Take 2 tablets (2.4 g total) by mouth daily with breakfast.   Multiple Vitamins-Minerals (MULTIVITAMIN ADULT PO) Take by mouth daily.   No current facility-administered medications for this visit. (Other)   REVIEW OF SYSTEMS: ROS   Positive for: Gastrointestinal Last edited by Etheleen Mayhew, COT on 09/29/2022 10:13 AM.     ALLERGIES No Known Allergies PAST MEDICAL HISTORY Past Medical History:  Diagnosis Date   Allergy    Anxiety    Chronic kidney disease    GERD (gastroesophageal reflux disease)    History of kidney stones    Hyperlipidemia    Renal stone    Ulcerative colitis [facility])    Past Surgical History:  Procedure Laterality Date   APPENDECTOMY     BACK SURGERY     EXTRACORPOREAL SHOCK WAVE LITHOTRIPSY Left 11/29/2016   Procedure: LEFT EXTRACORPOREAL SHOCK WAVE LITHOTRIPSY (ESWL);  Surgeon: Heloise Purpura, MD;  Location: [facility];  Service: Urology;  Laterality: Left;   HERNIA REPAIR     KNEE ARTHROSCOPY     2- both on right knee   LAPAROSCOPIC APPENDECTOMY N/A 03/16/2022   Procedure: APPENDECTOMY LAPAROSCOPIC;  Surgeon: Quentin Ore, MD;  Location: [facility] OR;  Service: General;  Laterality: N/A;   WISDOM TOOTH EXTRACTION  FAMILY HISTORY Family History  Problem Relation Age of Onset   Prostate cancer Father    Colon polyps Mother        benign   Heart attack Mother    Colon cancer Neg Hx    Rectal cancer Neg Hx    Stomach cancer Neg Hx    Esophageal cancer Neg Hx    SOCIAL HISTORY Social History   Tobacco Use   Smoking status: Never   Smokeless tobacco: Never  Vaping Use   Vaping Use: Never used  Substance Use Topics   Alcohol use: No   Drug use: No       OPHTHALMIC EXAM:  Base Eye Exam     Visual Acuity (Snellen - Linear)       Right Left   Dist cc 20/25 20/25 -1   Dist ph cc 20/20 -3 NI         Tonometry (Tonopen, 10:17 AM)        Right Left   Pressure 17 19         Pupils       Pupils Dark Light Shape React APD   Right PERRL 4 3 Round Brisk None   Left PERRL 4 3 Round Brisk None         Visual Fields       Left Right    Full Full         Extraocular Movement       Right Left    Full, Ortho Full, Ortho         Neuro/Psych     Oriented x3: Yes   Mood/Affect: Normal         Dilation     Both eyes: 2.5% Phenylephrine @ 10:17 AM           Slit Lamp and Fundus Exam     Slit Lamp Exam       Right Left   Lids/Lashes Dermatochalasis - upper lid, mild Telangiectasia Dermatochalasis - upper lid, mild Telangiectasia   Conjunctiva/Sclera White and quiet White and quiet   Cornea Mild tear film debris Mild tear film debris   Anterior Chamber deep and clear deep and clear   Iris Round and dilated Round and dilated   Lens 1-2+ Nuclear sclerosis, 1-2+ Cortical cataract 2+ Nuclear sclerosis, 2+ Cortical cataract, trace Posterior subcapsular cataract   Anterior Vitreous Vitreous syneresis, no pigment, Posterior vitreous detachment, Weiss ring Vitreous syneresis, no pigment, Posterior vitreous detachment, vitreous condensations         Fundus Exam       Right Left   Disc pink and sharp pink and sharp, PPP inferior   C/D Ratio 0.4 0.5   Macula Flat, blunted foveal reflex, no heme or edema Flat, blunted foveal reflex, trace ERM, no heme or edema   Vessels Normal Normal   Periphery Attached, very mild pavingstone IT, no RT/RD on 360 depression, punctate heme at 0100 attached           IMAGING AND PROCEDURES  Imaging and Procedures for 09/29/2022  OCT, Retina - OU - Both Eyes       Right Eye Quality was good. Central Foveal Thickness: 297. Progression has no prior data. Findings include normal foveal contour, no IRF, no SRF (Trace vitreous opacities).   Left Eye Quality was good. Central Foveal Thickness: 305. Progression has no prior data. Findings include normal foveal  contour, no IRF, no SRF (Trace ERM).   Notes *Images captured and  stored on drive  Diagnosis / Impression:  NFP, no IRF/SRF OU  Clinical management:  See below  Abbreviations: NFP - Normal foveal profile. CME - cystoid macular edema. PED - pigment epithelial detachment. IRF - intraretinal fluid. SRF - subretinal fluid. EZ - ellipsoid zone. ERM - epiretinal membrane. ORA - outer retinal atrophy. ORT - outer retinal tubulation. SRHM - subretinal hyper-reflective material. IRHM - intraretinal hyper-reflective material           ASSESSMENT/PLAN:   ICD-10-CM   1. Posterior vitreous detachment of both eyes  H43.813 OCT, Retina - OU - Both Eyes    2. Combined forms of age-related cataract of both eyes  H25.813      1. PVD / vitreous syneresis OU  - acute symptomatic fol and floaters OD -- onset Sunday, May 26 -- evaluated by Dr. Charlotte Sanes on Monday  - OS old PVD -- stable  - Discussed findings and prognosis  - No RT or RD on 360 scleral depressed exam OD  - Reviewed s/s of RT/RD  - Strict return precautions for any such RT/RD signs/symptoms  - f/u in 4 wks -- DFE/OCT  2. Mixed Cataract OU - The symptoms of cataract, surgical options, and treatments and risks were discussed with patient. - discussed diagnosis and progression - monitor  Ophthalmic Meds Ordered this visit:  No orders of the defined types were placed in this encounter.    Return in about 4 weeks (around 10/27/2022) for f/u PVD OD, DFE, OCT.  There are no Patient Instructions on file for this visit.  Explained the diagnoses, plan, and follow up with the patient and they expressed understanding.  Patient expressed understanding of the importance of proper follow up care.   This document serves as a record of services personally performed by Karie Chimera, MD, PhD. It was created on their behalf by De Blanch, an ophthalmic technician. The creation of this record is the provider's dictation and/or activities  during the visit.    Electronically signed by: De Blanch, OA, 09/29/22  12:48 PM  Karie Chimera, M.D., Ph.D. Diseases & Surgery of the Retina and Vitreous [facility] 09/29/2022  I have reviewed the above documentation for accuracy and completeness, and I agree with the above. Karie Chimera, M.D., Ph.D. 09/29/22 12:51 PM   Abbreviations: M myopia (nearsighted); A astigmatism; H hyperopia (farsighted); P presbyopia; Mrx spectacle prescription;  CTL contact lenses; OD right eye; OS left eye; OU both eyes  XT exotropia; ET esotropia; PEK punctate epithelial keratitis; PEE punctate epithelial erosions; DES dry eye syndrome; MGD meibomian gland dysfunction; ATs artificial tears; PFAT's preservative free artificial tears; NSC nuclear sclerotic cataract; PSC posterior subcapsular cataract; ERM epi-retinal membrane; PVD posterior vitreous detachment; RD retinal detachment; DM diabetes mellitus; DR diabetic retinopathy; NPDR non-proliferative diabetic retinopathy; PDR proliferative diabetic retinopathy; CSME clinically significant macular edema; DME diabetic macular edema; dbh dot blot hemorrhages; CWS cotton wool spot; POAG primary open angle glaucoma; C/D cup-to-disc ratio; HVF humphrey visual field; GVF goldmann visual field; OCT optical coherence tomography; IOP intraocular pressure; BRVO Branch retinal vein occlusion; CRVO central retinal vein occlusion; CRAO central retinal artery occlusion; BRAO branch retinal artery occlusion; RT retinal tear; SB scleral buckle; PPV pars plana vitrectomy; VH Vitreous hemorrhage; PRP panretinal laser photocoagulation; IVK intravitreal kenalog; VMT vitreomacular traction; MH Macular hole;  NVD neovascularization of the disc; NVE neovascularization elsewhere; AREDS age related eye disease study; ARMD age related macular degeneration; POAG primary open angle  glaucoma; EBMD epithelial/anterior basement membrane dystrophy; ACIOL anterior  chamber intraocular lens; IOL intraocular lens; PCIOL posterior chamber intraocular lens; Phaco/IOL phacoemulsification with intraocular lens placement; PRK photorefractive keratectomy; LASIK laser assisted in situ keratomileusis; HTN hypertension; DM diabetes mellitus; COPD chronic obstructive pulmonary disease

## 2022-09-29 NOTE — Progress Notes (Signed)
[facility] - Clinic Note  09/29/2022   CHIEF COMPLAINT Patient presents for Retina Evaluation  HISTORY OF PRESENT ILLNESS: Steven Torres is a 62 y.o. male who presents to the clinic today for:  HPI     Retina Evaluation   This started 2 days ago.  Duration of 2 days.  Associated Symptoms Floaters.        Comments   Retina eval per Dr Charlotte Sanes pt is reporting on Sunday started seeing flashes and floaters in the right eye pt is reporting no vision changes noticed       Last edited by Etheleen Mayhew, COT on 09/29/2022 10:13 AM.    Pt is here on the referral of Dr. Charlotte Sanes for fol and floaters OD, pt states on Sunday he was watching TV and getting ready to go to bed when he started seeing fol in his upper right quadrant of his right eye, he saw Dr. Charlotte Sanes on call on [facility], he states he is still seeing the fol, but they are a little better, he states he has had floaters prior to this, he denies being diabetic or hypertensive, he is a regular pt of Dr. Randon Goldsmith, he last saw him in November / December 2023   Referring physician: Maris Berger, MD [address] [place],  [place] [address]  HISTORICAL INFORMATION:  Selected notes from the MEDICAL RECORD NUMBER Referred by Dr. Charlotte Sanes for fol and floaters OD LEE:  Ocular Hx- PMH-   CURRENT MEDICATIONS: No current outpatient medications on file. (Ophthalmic Drugs)   No current facility-administered medications for this visit. (Ophthalmic Drugs)   Current Outpatient Medications (Other)  Medication Sig   acetaminophen (TYLENOL) 650 MG CR tablet Take 1 tablet (650 mg total) by mouth every 8 (eight) hours as needed for pain.   atorvastatin (LIPITOR) 10 MG tablet Take 1 tablet (10 mg total) by mouth daily.   Cholecalciferol (VITAMIN D) 2000 UNITS CAPS Take 1 capsule by mouth daily.   GLUCOSAMINE-CHONDROITIN PO Take 1 tablet by mouth daily.   mesalamine (LIALDA) 1.2 g EC tablet Take 2 tablets (2.4 g  total) by mouth daily with breakfast.   Multiple Vitamins-Minerals (MULTIVITAMIN ADULT PO) Take by mouth daily.   No current facility-administered medications for this visit. (Other)   REVIEW OF SYSTEMS: ROS   Positive for: Gastrointestinal Last edited by Etheleen Mayhew, COT on 09/29/2022 10:13 AM.     ALLERGIES No Known Allergies PAST MEDICAL HISTORY Past Medical History:  Diagnosis Date   Allergy    Anxiety    Chronic kidney disease    GERD (gastroesophageal reflux disease)    History of kidney stones    Hyperlipidemia    Renal stone    Ulcerative colitis [facility])    Past Surgical History:  Procedure Laterality Date   APPENDECTOMY     BACK SURGERY     EXTRACORPOREAL SHOCK WAVE LITHOTRIPSY Left 11/29/2016   Procedure: LEFT EXTRACORPOREAL SHOCK WAVE LITHOTRIPSY (ESWL);  Surgeon: Heloise Purpura, MD;  Location: [facility];  Service: Urology;  Laterality: Left;   HERNIA REPAIR     KNEE ARTHROSCOPY     2- both on right knee   LAPAROSCOPIC APPENDECTOMY N/A 03/16/2022   Procedure: APPENDECTOMY LAPAROSCOPIC;  Surgeon: Quentin Ore, MD;  Location: [facility] OR;  Service: General;  Laterality: N/A;   WISDOM TOOTH EXTRACTION     FAMILY HISTORY Family History  Problem Relation Age of Onset   Prostate cancer Father  Colon polyps Mother        benign   Heart attack Mother    Colon cancer Neg Hx    Rectal cancer Neg Hx    Stomach cancer Neg Hx    Esophageal cancer Neg Hx    SOCIAL HISTORY Social History   Tobacco Use   Smoking status: Never   Smokeless tobacco: Never  Vaping Use   Vaping Use: Never used  Substance Use Topics   Alcohol use: No   Drug use: No       OPHTHALMIC EXAM:  Base Eye Exam     Visual Acuity (Snellen - Linear)       Right Left   Dist cc 20/25 20/25 -1   Dist ph cc 20/20 -3 NI         Tonometry (Tonopen, 10:17 AM)       Right Left   Pressure 17 19         Pupils       Pupils Dark Light Shape React APD   Right PERRL 4 3  Round Brisk None   Left PERRL 4 3 Round Brisk None         Visual Fields       Left Right    Full Full         Extraocular Movement       Right Left    Full, Ortho Full, Ortho         Neuro/Psych     Oriented x3: Yes   Mood/Affect: Normal         Dilation     Both eyes: 2.5% Phenylephrine @ 10:17 AM           Slit Lamp and Fundus Exam     Slit Lamp Exam       Right Left   Lids/Lashes Dermatochalasis - upper lid, mild Telangiectasia Dermatochalasis - upper lid, mild Telangiectasia   Conjunctiva/Sclera White and quiet White and quiet   Cornea Mild tear film debris Mild tear film debris   Anterior Chamber deep and clear deep and clear   Iris Round and dilated Round and dilated   Lens 1-2+ Nuclear sclerosis, 1-2+ Cortical cataract 2+ Nuclear sclerosis, 2+ Cortical cataract, trace Posterior subcapsular cataract   Anterior Vitreous Vitreous syneresis, no pigment, Posterior vitreous detachment, Weiss ring Vitreous syneresis, no pigment, Posterior vitreous detachment, vitreous condensations         Fundus Exam       Right Left   Disc pink and sharp pink and sharp, PPP inferior   C/D Ratio 0.4 0.5   Macula Flat, blunted foveal reflex, no heme or edema Flat, blunted foveal reflex, trace ERM, no heme or edema   Vessels Normal Normal   Periphery Attached, very mild pavingstone IT, no RT/RD on 360 depression, punctate heme at 0100 attached           IMAGING AND PROCEDURES  Imaging and Procedures for 09/29/2022  OCT, Retina - OU - Both Eyes       Right Eye Quality was good. Central Foveal Thickness: 297. Progression has no prior data. Findings include normal foveal contour, no IRF, no SRF (Trace vitreous opacities).   Left Eye Quality was good. Central Foveal Thickness: 305. Progression has no prior data. Findings include normal foveal contour, no IRF, no SRF (Trace ERM).   Notes *Images captured and stored on drive  Diagnosis / Impression:  NFP,  no IRF/SRF OU  Clinical management:  See below  Abbreviations: NFP - Normal foveal profile. CME - cystoid macular edema. PED - pigment epithelial detachment. IRF - intraretinal fluid. SRF - subretinal fluid. EZ - ellipsoid zone. ERM - epiretinal membrane. ORA - outer retinal atrophy. ORT - outer retinal tubulation. SRHM - subretinal hyper-reflective material. IRHM - intraretinal hyper-reflective material           ASSESSMENT/PLAN:   ICD-10-CM   1. Posterior vitreous detachment of both eyes  H43.813 OCT, Retina - OU - Both Eyes    2. Combined forms of age-related cataract of both eyes  H25.813      1. PVD / vitreous syneresis OU  - symptomatic fol and floaters OD since Sunday, May 26  - Discussed findings and prognosis  - No RT or RD on 360 scleral depressed exam  - Reviewed s/s of RT/RD  - Strict return precautions for any such RT/RD signs/symptoms  - f/u in 4 wks -- DFE/OCT  2. Mixed Cataract OU - The symptoms of cataract, surgical options, and treatments and risks were discussed with patient. - discussed diagnosis and progression - monitor    Ophthalmic Meds Ordered this visit:  No orders of the defined types were placed in this encounter.    Return in about 4 weeks (around 10/27/2022) for f/u PVD OD, DFE, OCT.  There are no Patient Instructions on file for this visit.  Explained the diagnoses, plan, and follow up with the patient and they expressed understanding.  Patient expressed understanding of the importance of proper follow up care.   This document serves as a record of services personally performed by Karie Chimera, MD, PhD. It was created on their behalf by De Blanch, an ophthalmic technician. The creation of this record is the provider's dictation and/or activities during the visit.    Electronically signed by: De Blanch, OA, 09/29/22  11:18 AM     Karie Chimera, M.D., Ph.D. Diseases & Surgery of the Retina and Vitreous [facility] 09/29/2022  Abbreviations: M myopia (nearsighted); A astigmatism; H hyperopia (farsighted); P presbyopia; Mrx spectacle prescription;  CTL contact lenses; OD right eye; OS left eye; OU both eyes  XT exotropia; ET esotropia; PEK punctate epithelial keratitis; PEE punctate epithelial erosions; DES dry eye syndrome; MGD meibomian gland dysfunction; ATs artificial tears; PFAT's preservative free artificial tears; NSC nuclear sclerotic cataract; PSC posterior subcapsular cataract; ERM epi-retinal membrane; PVD posterior vitreous detachment; RD retinal detachment; DM diabetes mellitus; DR diabetic retinopathy; NPDR non-proliferative diabetic retinopathy; PDR proliferative diabetic retinopathy; CSME clinically significant macular edema; DME diabetic macular edema; dbh dot blot hemorrhages; CWS cotton wool spot; POAG primary open angle glaucoma; C/D cup-to-disc ratio; HVF humphrey visual field; GVF goldmann visual field; OCT optical coherence tomography; IOP intraocular pressure; BRVO Branch retinal vein occlusion; CRVO central retinal vein occlusion; CRAO central retinal artery occlusion; BRAO branch retinal artery occlusion; RT retinal tear; SB scleral buckle; PPV pars plana vitrectomy; VH Vitreous hemorrhage; PRP panretinal laser photocoagulation; IVK intravitreal kenalog; VMT vitreomacular traction; MH Macular hole;  NVD neovascularization of the disc; NVE neovascularization elsewhere; AREDS age related eye disease study; ARMD age related macular degeneration; POAG primary open angle glaucoma; EBMD epithelial/anterior basement membrane dystrophy; ACIOL anterior chamber intraocular lens; IOL intraocular lens; PCIOL posterior chamber intraocular lens; Phaco/IOL phacoemulsification with intraocular lens placement; PRK photorefractive keratectomy; LASIK laser assisted in situ keratomileusis; HTN hypertension; DM diabetes mellitus; COPD chronic obstructive pulmonary disease

## 2022-10-12 ENCOUNTER — Ambulatory Visit (AMBULATORY_SURGERY_CENTER): Payer: 59

## 2022-10-12 ENCOUNTER — Other Ambulatory Visit (HOSPITAL_BASED_OUTPATIENT_CLINIC_OR_DEPARTMENT_OTHER): Payer: Self-pay

## 2022-10-12 VITALS — Ht 73.0 in | Wt 195.0 lb

## 2022-10-12 DIAGNOSIS — K5130 Ulcerative (chronic) rectosigmoiditis without complications: Secondary | ICD-10-CM

## 2022-10-12 DIAGNOSIS — Z86010 Personal history of colonic polyps: Secondary | ICD-10-CM

## 2022-10-12 MED ORDER — SUTAB 1479-225-188 MG PO TABS
12.0000 | ORAL_TABLET | ORAL | 0 refills | Status: DC
  Filled 2022-10-12: qty 24, 1d supply, fill #0

## 2022-10-12 NOTE — Progress Notes (Signed)

## 2022-10-15 ENCOUNTER — Encounter: Payer: Self-pay | Admitting: Gastroenterology

## 2022-10-25 NOTE — Progress Notes (Addendum)
[facility] - Clinic Note  10/27/2022   CHIEF COMPLAINT Patient presents for Retina Follow Up  HISTORY OF PRESENT ILLNESS: Steven Torres is a 62 y.o. male who presents to the clinic today for:  HPI     Retina Follow Up   Patient presents with  PVD.  In right eye.  This started 4 weeks ago.  Duration of 4 weeks.  Since onset it is stable.        Comments   4 week retina follow up PVD OD pt is reporting some flashes and floaters at times       Last edited by Etheleen Mayhew, COT on 10/27/2022  2:03 PM.    Pt states he is not seeing very many floaters, but still seeing fol every once in a while   Referring physician: Antony Contras, MD [address] [place],  [place] [address]  HISTORICAL INFORMATION:  Selected notes from the MEDICAL RECORD NUMBER Referred by Dr. Charlotte Sanes for fol and floaters OD ([facility] pt) LEE:  Ocular Hx- PMH-   CURRENT MEDICATIONS: No current outpatient medications on file. (Ophthalmic Drugs)   No current facility-administered medications for this visit. (Ophthalmic Drugs)   Current Outpatient Medications (Other)  Medication Sig   acetaminophen (TYLENOL) 650 MG CR tablet Take 1 tablet (650 mg total) by mouth every 8 (eight) hours as needed for pain.   aspirin EC 81 MG tablet Take by mouth.   atorvastatin (LIPITOR) 10 MG tablet Take 1 tablet (10 mg total) by mouth daily.   Cholecalciferol (VITAMIN D) 2000 UNITS CAPS Take 1 capsule by mouth daily.   GLUCOSAMINE-CHONDROITIN PO Take 1 tablet by mouth daily.   mesalamine (LIALDA) 1.2 g EC tablet Take 2 tablets (2.4 g total) by mouth daily with breakfast.   Multiple Vitamins-Minerals (MULTIVITAMIN ADULT PO) Take by mouth daily.   Sodium Sulfate-Mag Sulfate-KCl (SUTAB) [phone] MG TABS Take 12 tablets by mouth as directed.   Turmeric (QC TUMERIC COMPLEX) 500 MG CAPS Herbal Name: tumeric   No current facility-administered medications for this visit. (Other)   REVIEW  OF SYSTEMS: ROS   Positive for: Gastrointestinal Last edited by Etheleen Mayhew, COT on 10/27/2022  2:03 PM.      ALLERGIES No Known Allergies PAST MEDICAL HISTORY Past Medical History:  Diagnosis Date   Allergy    Anxiety    Chronic kidney disease    GERD (gastroesophageal reflux disease)    History of kidney stones    Hyperlipidemia    Renal stone    Ulcerative colitis [facility])    Past Surgical History:  Procedure Laterality Date   APPENDECTOMY     BACK SURGERY     EXTRACORPOREAL SHOCK WAVE LITHOTRIPSY Left 11/29/2016   Procedure: LEFT EXTRACORPOREAL SHOCK WAVE LITHOTRIPSY (ESWL);  Surgeon: Heloise Purpura, MD;  Location: [facility];  Service: Urology;  Laterality: Left;   HERNIA REPAIR     KNEE ARTHROSCOPY     2- both on right knee   LAPAROSCOPIC APPENDECTOMY N/A 03/16/2022   Procedure: APPENDECTOMY LAPAROSCOPIC;  Surgeon: Quentin Ore, MD;  Location: [facility] OR;  Service: General;  Laterality: N/A;   WISDOM TOOTH EXTRACTION     FAMILY HISTORY Family History  Problem Relation Age of Onset   Prostate cancer Father    Colon polyps Mother        benign   Heart attack Mother    Colon cancer Neg Hx    Rectal cancer Neg  Hx    Stomach cancer Neg Hx    Esophageal cancer Neg Hx    SOCIAL HISTORY Social History   Tobacco Use   Smoking status: Never   Smokeless tobacco: Never  Vaping Use   Vaping Use: Never used  Substance Use Topics   Alcohol use: No   Drug use: No       OPHTHALMIC EXAM:  Base Eye Exam     Visual Acuity (Snellen - Linear)       Right Left   Dist cc 20/20 -2 20/25 -2   Dist ph cc  NI         Tonometry (Tonopen, 2:07 PM)       Right Left   Pressure 16 16         Pupils       Pupils Dark Light Shape React APD   Right PERRL 4 3 Round Brisk None   Left PERRL 4 3 Round Brisk None         Visual Fields       Left Right    Full Full         Extraocular Movement       Right Left    Full, Ortho Full, Ortho          Neuro/Psych     Oriented x3: Yes   Mood/Affect: Normal         Dilation     Both eyes:            Slit Lamp and Fundus Exam     Slit Lamp Exam       Right Left   Lids/Lashes Dermatochalasis - upper lid, mild Telangiectasia Dermatochalasis - upper lid, mild Telangiectasia   Conjunctiva/Sclera White and quiet White and quiet   Cornea Mild tear film debris Mild tear film debris   Anterior Chamber deep and clear deep and clear   Iris Round and dilated Round and dilated   Lens 1-2+ Nuclear sclerosis, 1-2+ Cortical cataract 2+ Nuclear sclerosis, 2+ Cortical cataract, trace Posterior subcapsular cataract   Anterior Vitreous Vitreous syneresis, no pigment, Posterior vitreous detachment, Weiss ring Vitreous syneresis, no pigment, Posterior vitreous detachment, vitreous condensations         Fundus Exam       Right Left   Disc pink and sharp pink and sharp, PPP inferior   C/D Ratio 0.4 0.5   Macula Flat, blunted foveal reflex, no heme or edema Flat, blunted foveal reflex, trace ERM, no heme or edema   Vessels Normal Normal   Periphery Attached, very mild pavingstone IT, no RT/RD on 360 depression, punctate heme at 0100 attached           IMAGING AND PROCEDURES  Imaging and Procedures for 10/27/2022  OCT, Retina - OU - Both Eyes       Right Eye Quality was good. Central Foveal Thickness: 299. Progression has been stable. Findings include normal foveal contour, no IRF, no SRF (Trace vitreous opacities -- improved).   Left Eye Quality was good. Central Foveal Thickness: 309. Progression has been stable. Findings include normal foveal contour, no IRF, no SRF (Trace ERM).   Notes *Images captured and stored on drive  Diagnosis / Impression:  NFP, no IRF/SRF OU  Clinical management:  See below  Abbreviations: NFP - Normal foveal profile. CME - cystoid macular edema. PED - pigment epithelial detachment. IRF - intraretinal fluid. SRF - subretinal fluid. EZ -  ellipsoid zone. ERM - epiretinal membrane.  ORA - outer retinal atrophy. ORT - outer retinal tubulation. SRHM - subretinal hyper-reflective material. IRHM - intraretinal hyper-reflective material            ASSESSMENT/PLAN:   ICD-10-CM   1. Posterior vitreous detachment of both eyes  H43.813 OCT, Retina - OU - Both Eyes    2. Combined forms of age-related cataract of both eyes  H25.813       1. PVD / vitreous syneresis OU  - acute symptomatic fol and floaters OD -- onset Sunday, May 26 -- evaluated by Dr. Charlotte Sanes on Monday  - OS old PVD -- stable  - Discussed findings and prognosis  - No RT or RD on 360 scleral depressed exam OD  - Reviewed s/s of RT/RD  - Strict return precautions for any such RT/RD signs/symptoms  - pt is cleared from a retina standpoint for release to Dr. Randon Goldsmith and resumption of primary eye care   2. Mixed Cataract OU - The symptoms of cataract, surgical options, and treatments and risks were discussed with patient. - discussed diagnosis and progression - monitor  Ophthalmic Meds Ordered this visit:  No orders of the defined types were placed in this encounter.    Return if symptoms worsen or fail to improve.  There are no Patient Instructions on file for this visit.  Explained the diagnoses, plan, and follow up with the patient and they expressed understanding.  Patient expressed understanding of the importance of proper follow up care.   This document serves as a record of services personally performed by Karie Chimera, MD, PhD. It was created on their behalf by De Blanch, an ophthalmic technician. The creation of this record is the provider's dictation and/or activities during the visit.    Electronically signed by: De Blanch, OA, 10/27/22  3:18 PM    Karie Chimera, M.D., Ph.D. Diseases & Surgery of the Retina and Vitreous [facility] 10/27/2022    Abbreviations: M myopia (nearsighted); A astigmatism;  H hyperopia (farsighted); P presbyopia; Mrx spectacle prescription;  CTL contact lenses; OD right eye; OS left eye; OU both eyes  XT exotropia; ET esotropia; PEK punctate epithelial keratitis; PEE punctate epithelial erosions; DES dry eye syndrome; MGD meibomian gland dysfunction; ATs artificial tears; PFAT's preservative free artificial tears; NSC nuclear sclerotic cataract; PSC posterior subcapsular cataract; ERM epi-retinal membrane; PVD posterior vitreous detachment; RD retinal detachment; DM diabetes mellitus; DR diabetic retinopathy; NPDR non-proliferative diabetic retinopathy; PDR proliferative diabetic retinopathy; CSME clinically significant macular edema; DME diabetic macular edema; dbh dot blot hemorrhages; CWS cotton wool spot; POAG primary open angle glaucoma; C/D cup-to-disc ratio; HVF humphrey visual field; GVF goldmann visual field; OCT optical coherence tomography; IOP intraocular pressure; BRVO Branch retinal vein occlusion; CRVO central retinal vein occlusion; CRAO central retinal artery occlusion; BRAO branch retinal artery occlusion; RT retinal tear; SB scleral buckle; PPV pars plana vitrectomy; VH Vitreous hemorrhage; PRP panretinal laser photocoagulation; IVK intravitreal kenalog; VMT vitreomacular traction; MH Macular hole;  NVD neovascularization of the disc; NVE neovascularization elsewhere; AREDS age related eye disease study; ARMD age related macular degeneration; POAG primary open angle glaucoma; EBMD epithelial/anterior basement membrane dystrophy; ACIOL anterior chamber intraocular lens; IOL intraocular lens; PCIOL posterior chamber intraocular lens; Phaco/IOL phacoemulsification with intraocular lens placement; PRK photorefractive keratectomy; LASIK laser assisted in situ keratomileusis; HTN hypertension; DM diabetes mellitus; COPD chronic obstructive pulmonary disease

## 2022-10-27 ENCOUNTER — Ambulatory Visit (INDEPENDENT_AMBULATORY_CARE_PROVIDER_SITE_OTHER): Payer: 59 | Admitting: Ophthalmology

## 2022-10-27 ENCOUNTER — Encounter (INDEPENDENT_AMBULATORY_CARE_PROVIDER_SITE_OTHER): Payer: Self-pay | Admitting: Ophthalmology

## 2022-10-27 DIAGNOSIS — H43813 Vitreous degeneration, bilateral: Secondary | ICD-10-CM | POA: Diagnosis not present

## 2022-10-27 DIAGNOSIS — H25813 Combined forms of age-related cataract, bilateral: Secondary | ICD-10-CM | POA: Diagnosis not present

## 2022-10-28 ENCOUNTER — Encounter (INDEPENDENT_AMBULATORY_CARE_PROVIDER_SITE_OTHER): Payer: Self-pay | Admitting: Ophthalmology

## 2022-10-28 NOTE — Progress Notes (Signed)
[facility] - Clinic Note  10/27/2022   CHIEF COMPLAINT Patient presents for Retina Follow Up  HISTORY OF PRESENT ILLNESS: Steven Torres is a 62 y.o. male who presents to the clinic today for:  HPI     Retina Follow Up   Patient presents with  PVD.  In right eye.  This started 4 weeks ago.  Duration of 4 weeks.  Since onset it is stable.  I, the attending physician,  performed the HPI with the patient and updated documentation appropriately.        Comments   4 week retina follow up PVD OD pt is reporting some flashes and floaters at times       Last edited by Rennis Chris, MD on 10/28/2022  8:16 AM.    Pt states he is not seeing very many floaters, but still seeing fol every once in a while   Referring physician: Antony Contras, MD [address] [place],  [place] [address]  HISTORICAL INFORMATION:  Selected notes from the MEDICAL RECORD NUMBER Referred by Dr. Charlotte Sanes for fol and floaters OD ([facility]) LEE:  Ocular Hx- PMH-   CURRENT MEDICATIONS: No current outpatient medications on file. (Ophthalmic Drugs)   No current facility-administered medications for this visit. (Ophthalmic Drugs)   Current Outpatient Medications (Other)  Medication Sig   acetaminophen (TYLENOL) 650 MG CR tablet Take 1 tablet (650 mg total) by mouth every 8 (eight) hours as needed for pain.   aspirin EC 81 MG tablet Take by mouth.   atorvastatin (LIPITOR) 10 MG tablet Take 1 tablet (10 mg total) by mouth daily.   Cholecalciferol (VITAMIN D) 2000 UNITS CAPS Take 1 capsule by mouth daily.   GLUCOSAMINE-CHONDROITIN PO Take 1 tablet by mouth daily.   mesalamine (LIALDA) 1.2 g EC tablet Take 2 tablets (2.4 g total) by mouth daily with breakfast.   Multiple Vitamins-Minerals (MULTIVITAMIN ADULT PO) Take by mouth daily.   Sodium Sulfate-Mag Sulfate-KCl (SUTAB) [phone] MG TABS Take 12 tablets by mouth as directed.   Turmeric (QC TUMERIC COMPLEX) 500 MG CAPS Herbal  Name: tumeric   No current facility-administered medications for this visit. (Other)   REVIEW OF SYSTEMS: ROS   Positive for: Gastrointestinal Last edited by Etheleen Mayhew, COT on 10/27/2022  2:03 PM.      ALLERGIES No Known Allergies PAST MEDICAL HISTORY Past Medical History:  Diagnosis Date   Allergy    Anxiety    Chronic kidney disease    GERD (gastroesophageal reflux disease)    History of kidney stones    Hyperlipidemia    Renal stone    Ulcerative colitis [facility])    Past Surgical History:  Procedure Laterality Date   APPENDECTOMY     BACK SURGERY     EXTRACORPOREAL SHOCK WAVE LITHOTRIPSY Left 11/29/2016   Procedure: LEFT EXTRACORPOREAL SHOCK WAVE LITHOTRIPSY (ESWL);  Surgeon: Heloise Purpura, MD;  Location: [facility];  Service: Urology;  Laterality: Left;   HERNIA REPAIR     KNEE ARTHROSCOPY     2- both on right knee   LAPAROSCOPIC APPENDECTOMY N/A 03/16/2022   Procedure: APPENDECTOMY LAPAROSCOPIC;  Surgeon: Quentin Ore, MD;  Location: [facility] OR;  Service: General;  Laterality: N/A;   WISDOM TOOTH EXTRACTION     FAMILY HISTORY Family History  Problem Relation Age of Onset   Prostate cancer Father    Colon polyps Mother        benign   Heart  attack Mother    Colon cancer Neg Hx    Rectal cancer Neg Hx    Stomach cancer Neg Hx    Esophageal cancer Neg Hx    SOCIAL HISTORY Social History   Tobacco Use   Smoking status: Never   Smokeless tobacco: Never  Vaping Use   Vaping Use: Never used  Substance Use Topics   Alcohol use: No   Drug use: No       OPHTHALMIC EXAM:  Base Eye Exam     Visual Acuity (Snellen - Linear)       Right Left   Dist cc 20/20 -2 20/25 -2   Dist ph cc  NI         Tonometry (Tonopen, 2:07 PM)       Right Left   Pressure 16 16         Pupils       Pupils Dark Light Shape React APD   Right PERRL 4 3 Round Brisk None   Left PERRL 4 3 Round Brisk None         Visual Fields       Left Right     Full Full         Extraocular Movement       Right Left    Full, Ortho Full, Ortho         Neuro/Psych     Oriented x3: Yes   Mood/Affect: Normal         Dilation     Both eyes:            Slit Lamp and Fundus Exam     Slit Lamp Exam       Right Left   Lids/Lashes Dermatochalasis - upper lid, mild Telangiectasia Dermatochalasis - upper lid, mild Telangiectasia   Conjunctiva/Sclera White and quiet White and quiet   Cornea Mild tear film debris Mild tear film debris   Anterior Chamber deep and clear deep and clear   Iris Round and dilated Round and dilated   Lens 1-2+ Nuclear sclerosis, 1-2+ Cortical cataract 2+ Nuclear sclerosis, 2+ Cortical cataract, trace Posterior subcapsular cataract   Anterior Vitreous Vitreous syneresis, no pigment, Posterior vitreous detachment, Weiss ring settling inferiorly Vitreous syneresis, no pigment, Posterior vitreous detachment, vitreous condensations         Fundus Exam       Right Left   Disc pink and sharp pink and sharp, PPP inferior   C/D Ratio 0.5 0.5   Macula Flat, good foveal reflex, no heme or edema Flat, blunted foveal reflex, trace ERM, no heme or edema   Vessels Normal Attenuated, mild AV crossing changes   Periphery Attached, very mild pavingstone IT, no RT/RD, punctate heme at 0100 -- resolved attached           IMAGING AND PROCEDURES  Imaging and Procedures for 10/27/2022  OCT, Retina - OU - Both Eyes       Right Eye Quality was good. Central Foveal Thickness: 299. Progression has been stable. Findings include normal foveal contour, no IRF, no SRF (Trace vitreous opacities -- improved).   Left Eye Quality was good. Central Foveal Thickness: 309. Progression has been stable. Findings include normal foveal contour, no IRF, no SRF (Trace ERM).   Notes *Images captured and stored on drive  Diagnosis / Impression:  NFP, no IRF/SRF OU  Clinical management:  See below  Abbreviations: NFP - Normal  foveal profile. CME - cystoid macular edema. PED -  pigment epithelial detachment. IRF - intraretinal fluid. SRF - subretinal fluid. EZ - ellipsoid zone. ERM - epiretinal membrane. ORA - outer retinal atrophy. ORT - outer retinal tubulation. SRHM - subretinal hyper-reflective material. IRHM - intraretinal hyper-reflective material           ASSESSMENT/PLAN:   ICD-10-CM   1. Posterior vitreous detachment of both eyes  H43.813 OCT, Retina - OU - Both Eyes    2. Combined forms of age-related cataract of both eyes  H25.813       1. PVD / vitreous syneresis OU  - acute symptomatic fol and floaters OD -- onset Sunday, May 26 -- evaluated by Dr. Charlotte Sanes on Monday  - OS old PVD -- stable  - Discussed findings and prognosis  - No RT or RD on 360 scleral depressed exam OD  - Reviewed s/s of RT/RD  - Strict return precautions for any such RT/RD signs/symptoms  - pt is cleared from a retina standpoint for release to Dr. Randon Goldsmith and resumption of primary eye care  2. Mixed Cataract OU - The symptoms of cataract, surgical options, and treatments and risks were discussed with patient. - discussed diagnosis and progression - monitor  Ophthalmic Meds Ordered this visit:  No orders of the defined types were placed in this encounter.    Return if symptoms worsen or fail to improve.  There are no Patient Instructions on file for this visit.  Explained the diagnoses, plan, and follow up with the patient and they expressed understanding.  Patient expressed understanding of the importance of proper follow up care.   This document serves as a record of services personally performed by Karie Chimera, MD, PhD. It was created on their behalf by De Blanch, an ophthalmic technician. The creation of this record is the provider's dictation and/or activities during the visit.    Electronically signed by: De Blanch, OA, 10/28/22  8:20 AM  Karie Chimera, M.D., Ph.D. Diseases & Surgery of the  Retina and Vitreous [facility] 10/27/2022  I have reviewed the above documentation for accuracy and completeness, and I agree with the above. Karie Chimera, M.D., Ph.D. 10/28/22 8:20 AM   Abbreviations: M myopia (nearsighted); A astigmatism; H hyperopia (farsighted); P presbyopia; Mrx spectacle prescription;  CTL contact lenses; OD right eye; OS left eye; OU both eyes  XT exotropia; ET esotropia; PEK punctate epithelial keratitis; PEE punctate epithelial erosions; DES dry eye syndrome; MGD meibomian gland dysfunction; ATs artificial tears; PFAT's preservative free artificial tears; NSC nuclear sclerotic cataract; PSC posterior subcapsular cataract; ERM epi-retinal membrane; PVD posterior vitreous detachment; RD retinal detachment; DM diabetes mellitus; DR diabetic retinopathy; NPDR non-proliferative diabetic retinopathy; PDR proliferative diabetic retinopathy; CSME clinically significant macular edema; DME diabetic macular edema; dbh dot blot hemorrhages; CWS cotton wool spot; POAG primary open angle glaucoma; C/D cup-to-disc ratio; HVF humphrey visual field; GVF goldmann visual field; OCT optical coherence tomography; IOP intraocular pressure; BRVO Branch retinal vein occlusion; CRVO central retinal vein occlusion; CRAO central retinal artery occlusion; BRAO branch retinal artery occlusion; RT retinal tear; SB scleral buckle; PPV pars plana vitrectomy; VH Vitreous hemorrhage; PRP panretinal laser photocoagulation; IVK intravitreal kenalog; VMT vitreomacular traction; MH Macular hole;  NVD neovascularization of the disc; NVE neovascularization elsewhere; AREDS age related eye disease study; ARMD age related macular degeneration; POAG primary open angle glaucoma; EBMD epithelial/anterior basement membrane dystrophy; ACIOL anterior chamber intraocular lens; IOL intraocular lens; PCIOL posterior chamber intraocular lens; Phaco/IOL phacoemulsification with intraocular lens  placement; PRK  photorefractive keratectomy; LASIK laser assisted in situ keratomileusis; HTN hypertension; DM diabetes mellitus; COPD chronic obstructive pulmonary disease

## 2022-10-29 ENCOUNTER — Ambulatory Visit (AMBULATORY_SURGERY_CENTER): Payer: 59 | Admitting: Gastroenterology

## 2022-10-29 ENCOUNTER — Encounter: Payer: Self-pay | Admitting: Gastroenterology

## 2022-10-29 VITALS — BP 102/72 | HR 63 | Temp 98.7°F | Resp 17 | Ht 73.0 in | Wt 195.0 lb

## 2022-10-29 DIAGNOSIS — K5130 Ulcerative (chronic) rectosigmoiditis without complications: Secondary | ICD-10-CM | POA: Diagnosis not present

## 2022-10-29 DIAGNOSIS — D123 Benign neoplasm of transverse colon: Secondary | ICD-10-CM | POA: Diagnosis not present

## 2022-10-29 DIAGNOSIS — Z86010 Personal history of colonic polyps: Secondary | ICD-10-CM

## 2022-10-29 DIAGNOSIS — F419 Anxiety disorder, unspecified: Secondary | ICD-10-CM | POA: Diagnosis not present

## 2022-10-29 DIAGNOSIS — Z09 Encounter for follow-up examination after completed treatment for conditions other than malignant neoplasm: Secondary | ICD-10-CM | POA: Diagnosis not present

## 2022-10-29 DIAGNOSIS — E785 Hyperlipidemia, unspecified: Secondary | ICD-10-CM | POA: Diagnosis not present

## 2022-10-29 MED ORDER — SODIUM CHLORIDE 0.9 % IV SOLN: 500.0000 mL | Freq: Once | INTRAVENOUS | Status: DC

## 2022-10-29 NOTE — Progress Notes (Signed)
Sedate, gd SR, tolerated procedure well, VSS, report to RN 

## 2022-10-29 NOTE — Progress Notes (Signed)
Pt's states no medical or surgical changes since previsit or office visit. 

## 2022-10-29 NOTE — Patient Instructions (Addendum)
Continue present medications. Await pathology results.  Please read over handouts about hemorrhoids, diverticulosis and polyps   YOU HAD AN ENDOSCOPIC PROCEDURE TODAY AT THE [facility]:   Refer to the procedure report that was given to you for any specific questions about what was found during the examination.  If the procedure report does not answer your questions, please call your gastroenterologist to clarify.  If you requested that your care partner not be given the details of your procedure findings, then the procedure report has been included in a sealed envelope for you to review at your convenience later.  YOU SHOULD EXPECT: Some feelings of bloating in the abdomen. Passage of more gas than usual.  Walking can help get rid of the air that was put into your GI tract during the procedure and reduce the bloating. If you had a lower endoscopy (such as a colonoscopy or flexible sigmoidoscopy) you may notice spotting of blood in your stool or on the toilet paper. If you underwent a bowel prep for your procedure, you may not have a normal bowel movement for a few days.  Please Note:  You might notice some irritation and congestion in your nose or some drainage.  This is from the oxygen used during your procedure.  There is no need for concern and it should clear up in a day or so.  SYMPTOMS TO REPORT IMMEDIATELY:  Following lower endoscopy (colonoscopy or flexible sigmoidoscopy):  Excessive amounts of blood in the stool  Significant tenderness or worsening of abdominal pains  Swelling of the abdomen that is new, acute  Fever of 100F or higher  For urgent or emergent issues, a gastroenterologist can be reached at any hour by calling [phone]. Do not use MyChart messaging for urgent concerns.    DIET:  We do recommend a small meal at first, but then you may proceed to your regular diet.  Drink plenty of fluids but you should avoid alcoholic beverages for 24  hours.  ACTIVITY:  You should plan to take it easy for the rest of today and you should NOT DRIVE or use heavy machinery until tomorrow (because of the sedation medicines used during the test).    FOLLOW UP: Our staff will call the number listed on your records the next business day following your procedure.  We will call around 7:15- 8:00 am to check on you and address any questions or concerns that you may have regarding the information given to you following your procedure. If we do not reach you, we will leave a message.     If any biopsies were taken you will be contacted by phone or by letter within the next 1-3 weeks.  Please call us at [phone] if you have not heard about the biopsies in 3 weeks.    SIGNATURES/CONFIDENTIALITY: You and/or your care partner have signed paperwork which will be entered into your electronic medical record.  These signatures attest to the fact that that the information above on your After Visit Summary has been reviewed and is understood.  Full responsibility of the confidentiality of this discharge information lies with you and/or your care-partner.

## 2022-10-29 NOTE — Op Note (Signed)
Steven Torres Patient Name: Steven Torres Procedure Date: 10/29/2022 10:20 AM MRN: [id number] Endoscopist: Viviann Spare P. Adela Lank , MD, [id number] Age: 62 Referring MD:  Date of Birth: 1960/08/20 Gender: Male Account #: [id number] Procedure:                Colonoscopy Indications:              Disease activity assessment of left-sided chronic                            ulcerative colitis - on Lialda, history of polyps Medicines:                Monitored Anesthesia Care Procedure:                Pre-Anesthesia Assessment:                           - Prior to the procedure, a History and Physical                            was performed, and patient medications and                            allergies were reviewed. The patient's tolerance of                            previous anesthesia was also reviewed. The risks                            and benefits of the procedure and the sedation                            options and risks were discussed with the patient.                            All questions were answered, and informed consent                            was obtained. Prior Anticoagulants: The patient has                            taken no anticoagulant or antiplatelet agents. ASA                            Grade Assessment: II - A patient with mild systemic                            disease. After reviewing the risks and benefits,                            the patient was deemed in satisfactory condition to                            undergo the procedure.  After obtaining informed consent, the colonoscope                            was passed under direct vision. Throughout the                            procedure, the patient's blood pressure, pulse, and                            oxygen saturations were monitored continuously. The                            Olympus Scope SN: [id number] was introduced through                            the  anus and advanced to the the cecum, identified                            by appendiceal orifice and ileocecal valve. The                            colonoscopy was performed without difficulty. The                            patient tolerated the procedure well. The quality                            of the bowel preparation was good. The ileocecal                            valve, appendiceal orifice, and rectum were                            photographed. Scope In: 10:30:36 AM Scope Out: 10:56:09 AM Scope Withdrawal Time: 0 hours 19 minutes 19 seconds  Total Procedure Duration: 0 hours 25 minutes 33 seconds  Findings:                 The perianal and digital rectal examinations were                            normal.                           The colon was long and redundant. Abdominal                            pressure utilized to achieve cecal intubation.                           A 4 to 5 mm polyp was found in the transverse                            colon. The polyp was sessile. The polyp was removed  with a cold snare. Resection and retrieval were                            complete.                           Multiple small-mouthed diverticula were found in                            the sigmoid colon.                           Internal hemorrhoids were found during retroflexion.                           The exam was otherwise without abnormality.                           Biopsies were taken with a cold forceps in the                            rectum and in the sigmoid colon for histology for                            history of colitis. Complications:            No immediate complications. Estimated blood loss:                            Minimal. Estimated Blood Loss:     Estimated blood loss was minimal. Impression:               - Redundant / long colon.                           - One 4 to 5 mm polyp in the transverse colon,                             removed with a cold snare. Resected and retrieved.                           - Diverticulosis in the sigmoid colon.                           - Internal hemorrhoids.                           - The examination was otherwise normal.                           - Biopsies were taken with a cold forceps for                            histology in the rectum and in the sigmoid colon.  Overall, good control of colitis on Lialda. Recommendation:           - Patient has a contact number available for                            emergencies. The signs and symptoms of potential                            delayed complications were discussed with the                            patient. Return to normal activities tomorrow.                            Written discharge instructions were provided to the                            patient.                           - Resume previous diet.                           - Continue present medications.                           - Await pathology results. Viviann Spare P. Michol Emory, MD 10/29/2022 11:04:20 AM This report has been signed electronically.

## 2022-10-29 NOTE — Progress Notes (Signed)
Called to room to assist during endoscopic procedure.  Patient ID and intended procedure confirmed with present staff. Received instructions for my participation in the procedure from the performing physician.  

## 2022-10-29 NOTE — Progress Notes (Signed)
Gastroenterology History and Physical   Primary Care Physician:  Sheliah Hatch, MD   Reason for Procedure:   Colitis, history of colon polyps  Plan:    colonoscopy     HPI: Steven Torres is a 62 y.o. male  here for colonoscopy surveillance - history of left sided colitis on Lialda. Mildly active colitis microscopically on the last exam in 2020 along with some polyps.   Patient denies any bowel symptoms at this time.  Otherwise feels well without any cardiopulmonary symptoms.   I have discussed risks / benefits of anesthesia and endoscopic procedure with Morene Rankins and they wish to proceed with the exams as outlined today.    Past Medical History:  Diagnosis Date   Allergy    Anxiety    Chronic kidney disease    GERD (gastroesophageal reflux disease)    History of kidney stones    Hyperlipidemia    Renal stone    Ulcerative colitis [facility])     Past Surgical History:  Procedure Laterality Date   APPENDECTOMY     BACK SURGERY     EXTRACORPOREAL SHOCK WAVE LITHOTRIPSY Left 11/29/2016   Procedure: LEFT EXTRACORPOREAL SHOCK WAVE LITHOTRIPSY (ESWL);  Surgeon: Heloise Purpura, MD;  Location: [facility];  Service: Urology;  Laterality: Left;   HERNIA REPAIR     KNEE ARTHROSCOPY     2- both on right knee   LAPAROSCOPIC APPENDECTOMY N/A 03/16/2022   Procedure: APPENDECTOMY LAPAROSCOPIC;  Surgeon: Quentin Ore, MD;  Location: [facility] OR;  Service: General;  Laterality: N/A;   WISDOM TOOTH EXTRACTION      Prior to Admission medications   Medication Sig Start Date End Date Taking? Authorizing Provider  aspirin EC 81 MG tablet Take by mouth.   Yes [provider]  atorvastatin (LIPITOR) 10 MG tablet Take 1 tablet (10 mg total) by mouth daily. 05/19/22  Yes Sheliah Hatch, MD  Cholecalciferol (VITAMIN D) 2000 UNITS CAPS Take 1 capsule by mouth daily.   Yes [provider]  GLUCOSAMINE-CHONDROITIN PO Take 1 tablet by mouth daily.   Yes [provider]  mesalamine (LIALDA) 1.2 g EC tablet Take 2 tablets (2.4 g total) by mouth daily with breakfast. 09/01/22  Yes Durinda Buzzelli, Willaim Rayas, MD  Multiple Vitamins-Minerals (MULTIVITAMIN ADULT PO) Take by mouth daily.   Yes [provider]  Turmeric (QC TUMERIC COMPLEX) 500 MG CAPS Herbal Name: tumeric   Yes [provider]  acetaminophen (TYLENOL) 650 MG CR tablet Take 1 tablet (650 mg total) by mouth every 8 (eight) hours as needed for pain. 03/05/22   Monica Becton, MD    Current Outpatient Medications  Medication Sig Dispense Refill   aspirin EC 81 MG tablet Take by mouth.     atorvastatin (LIPITOR) 10 MG tablet Take 1 tablet (10 mg total) by mouth daily. 30 tablet 6   Cholecalciferol (VITAMIN D) 2000 UNITS CAPS Take 1 capsule by mouth daily.     GLUCOSAMINE-CHONDROITIN PO Take 1 tablet by mouth daily.     mesalamine (LIALDA) 1.2 g EC tablet Take 2 tablets (2.4 g total) by mouth daily with breakfast. 180 tablet 1   Multiple Vitamins-Minerals (MULTIVITAMIN ADULT PO) Take by mouth daily.     Turmeric (QC TUMERIC COMPLEX) 500 MG CAPS Herbal Name: tumeric     acetaminophen (TYLENOL) 650 MG CR tablet Take 1 tablet (650 mg total) by mouth every 8 (eight) hours as needed for pain. 90 tablet  3   Current Facility-Administered Medications  Medication Dose Route Frequency Provider Last Rate Last Admin   0.9 %  sodium chloride infusion  500 mL Intravenous Once Brigitt Mcclish, Willaim Rayas, MD        Allergies as of 10/29/2022   (No Known Allergies)    Family History  Problem Relation Age of Onset   Prostate cancer Father    Colon polyps Mother        benign   Heart attack Mother    Colon cancer Neg Hx    Rectal cancer Neg Hx    Stomach cancer Neg Hx    Esophageal cancer Neg Hx     Social History   Socioeconomic History   Marital status: Married    Spouse name: Not on file   Number of children: 2   Years of education: Not on file   Highest education level:  Not on file  Occupational History   Occupation: Scientist, water quality  Tobacco Use   Smoking status: Never   Smokeless tobacco: Never  Vaping Use   Vaping Use: Never used  Substance and Sexual Activity   Alcohol use: No   Drug use: No   Sexual activity: Not on file  Other Topics Concern   Not on file  Social History Narrative   Not on file   Social Determinants of Health   Financial Resource Strain: Not on file  Food Insecurity: Not on file  Transportation Needs: Not on file  Physical Activity: Not on file  Stress: Not on file  Social Connections: Not on file  Intimate Partner Violence: Not on file    Review of Systems: All other review of systems negative except as mentioned in the HPI.  Physical Exam: Vital signs BP (!) 146/80   Pulse 73   Temp 98.7 F (37.1 C)   Ht 6\' 1"  (1.854 m)   Wt 195 lb (88.5 kg)   SpO2 98%   BMI 25.73 kg/m   General:   Alert,  Well-developed, pleasant and cooperative in NAD Lungs:  Clear throughout to auscultation.   Heart:  Regular rate and rhythm Abdomen:  Soft, nontender and nondistended.   Neuro/Psych:  Alert and cooperative. Normal mood and affect. A and O x 3  Harlin Rain, MD [facility]

## 2022-11-01 ENCOUNTER — Telehealth: Payer: Self-pay | Admitting: *Deleted

## 2022-11-01 NOTE — Telephone Encounter (Signed)
  Follow up Call-     10/29/2022    9:52 AM  Call back number  Post procedure Call Back phone  # [phone]  Permission to leave phone message Yes    [facility]

## 2022-11-10 ENCOUNTER — Other Ambulatory Visit (HOSPITAL_BASED_OUTPATIENT_CLINIC_OR_DEPARTMENT_OTHER): Payer: Self-pay

## 2022-11-24 ENCOUNTER — Ambulatory Visit (INDEPENDENT_AMBULATORY_CARE_PROVIDER_SITE_OTHER): Payer: 59 | Admitting: Family Medicine

## 2022-11-24 ENCOUNTER — Other Ambulatory Visit (HOSPITAL_BASED_OUTPATIENT_CLINIC_OR_DEPARTMENT_OTHER): Payer: Self-pay

## 2022-11-24 ENCOUNTER — Encounter: Payer: Self-pay | Admitting: Family Medicine

## 2022-11-24 VITALS — BP 126/74 | HR 72 | Temp 97.6°F | Ht 73.0 in | Wt 203.5 lb

## 2022-11-24 DIAGNOSIS — E785 Hyperlipidemia, unspecified: Secondary | ICD-10-CM | POA: Diagnosis not present

## 2022-11-24 DIAGNOSIS — Z0000 Encounter for general adult medical examination without abnormal findings: Secondary | ICD-10-CM | POA: Diagnosis not present

## 2022-11-24 DIAGNOSIS — Z125 Encounter for screening for malignant neoplasm of prostate: Secondary | ICD-10-CM | POA: Diagnosis not present

## 2022-11-24 LAB — HEPATIC FUNCTION PANEL
ALT: 26 U/L (ref 0–53)
AST: 28 U/L (ref 0–37)
Albumin: 4.6 g/dL (ref 3.5–5.2)
Alkaline Phosphatase: 77 U/L (ref 39–117)
Bilirubin, Direct: 0.2 mg/dL (ref 0.0–0.3)
Total Bilirubin: 1.1 mg/dL (ref 0.2–1.2)
Total Protein: 7.2 g/dL (ref 6.0–8.3)

## 2022-11-24 LAB — CBC WITH DIFFERENTIAL/PLATELET
Basophils Absolute: 0.1 10*3/uL (ref 0.0–0.1)
Basophils Relative: 0.7 % (ref 0.0–3.0)
Eosinophils Absolute: 0.1 10*3/uL (ref 0.0–0.7)
Eosinophils Relative: 1.3 % (ref 0.0–5.0)
HCT: 45.1 % (ref 39.0–52.0)
Hemoglobin: 14.7 g/dL (ref 13.0–17.0)
Lymphocytes Relative: 18.4 % (ref 12.0–46.0)
Lymphs Abs: 1.3 10*3/uL (ref 0.7–4.0)
MCHC: 32.6 g/dL (ref 30.0–36.0)
MCV: 97.1 fl (ref 78.0–100.0)
Monocytes Absolute: 0.5 10*3/uL (ref 0.1–1.0)
Monocytes Relative: 7.6 % (ref 3.0–12.0)
Neutro Abs: 5.1 10*3/uL (ref 1.4–7.7)
Neutrophils Relative %: 72 % (ref 43.0–77.0)
Platelets: 367 10*3/uL (ref 150.0–400.0)
RBC: 4.64 Mil/uL (ref 4.22–5.81)
RDW: 13.3 % (ref 11.5–15.5)
WBC: 7.1 10*3/uL (ref 4.0–10.5)

## 2022-11-24 LAB — BASIC METABOLIC PANEL
BUN: 15 mg/dL (ref 6–23)
CO2: 29 mEq/L (ref 19–32)
Calcium: 9.8 mg/dL (ref 8.4–10.5)
Chloride: 104 mEq/L (ref 96–112)
Creatinine, Ser: 1.06 mg/dL (ref 0.40–1.50)
GFR: 75.48 mL/min (ref >60.00)
Glucose, Bld: 100 mg/dL — ABNORMAL HIGH (ref 70–99)
Potassium: 4.1 mEq/L (ref 3.5–5.1)
Sodium: 142 mEq/L (ref 135–145)

## 2022-11-24 LAB — LIPID PANEL
Cholesterol: 167 mg/dL (ref 0–200)
HDL: 59.6 mg/dL (ref >39.00)
LDL Cholesterol: 90 mg/dL (ref 0–99)
NonHDL: 107.67
Total CHOL/HDL Ratio: 3
Triglycerides: 89 mg/dL (ref 0.0–149.0)
VLDL: 17.8 mg/dL (ref 0.0–40.0)

## 2022-11-24 LAB — TSH: TSH: 1.61 u[IU]/mL (ref 0.35–5.50)

## 2022-11-24 LAB — PSA: PSA: 1.46 ng/mL (ref 0.10–4.00)

## 2022-11-24 MED ORDER — ATORVASTATIN CALCIUM 10 MG PO TABS
10.0000 mg | ORAL_TABLET | Freq: Every day | ORAL | 1 refills | Status: DC
  Filled 2022-11-24 – 2022-12-13 (×2): qty 90, 90d supply, fill #0
  Filled 2023-03-14 – 2023-03-15 (×2): qty 90, 90d supply, fill #1

## 2022-11-24 NOTE — Assessment & Plan Note (Signed)
Tolerating statin w/o difficulty.  Check labs.  Adjust meds prn  

## 2022-11-24 NOTE — Assessment & Plan Note (Signed)
Pt's PE WNL.  UTD on colonoscopy, Tdap.  Check labs.  Anticipatory guidance provided.  

## 2022-11-24 NOTE — Patient Instructions (Signed)
Follow up in 1 year or as needed We'll notify you of your lab results and make any changes if needed Keep up the good work on healthy diet and regular exercise- you look great! Think about the Flomax (Tamulosin) to help w/ the nighttime symptoms Call with any questions or concerns Stay Safe!  Stay Healthy! Enjoy the rest of your summer!!!

## 2022-11-24 NOTE — Progress Notes (Signed)
   Subjective:    Patient ID: Steven Torres, male    DOB: Jan 16, 1961, 62 y.o.   MRN: [id number]  HPI CPE- UTD on colonoscopy Tdap  Patient Care Team    Relationship Specialty Notifications Start End  Sheliah Hatch, MD PCP - General Family Medicine  09/15/15   Armbruster, Willaim Rayas, MD Consulting Physician Gastroenterology  10/24/17   Crist Fat, MD Attending Physician Urology  11/09/18     Health Maintenance  Topic Date Due   Zoster Vaccines- Shingrix (1 of 2) Never done   INFLUENZA VACCINE  12/02/2022   Colonoscopy  10/29/2026   DTaP/Tdap/Td (2 - Td or Tdap) 10/25/2027   Hepatitis C Screening  Completed   HIV Screening  Completed   HPV VACCINES  Aged Out   COVID-19 Vaccine  Discontinued      Review of Systems Patient reports no vision/hearing changes, anorexia, fever ,adenopathy, persistant/recurrent hoarseness, swallowing issues, chest pain, palpitations, edema, persistant/recurrent cough, hemoptysis, dyspnea (rest,exertional, paroxysmal nocturnal), gastrointestinal  bleeding (melena, rectal bleeding), abdominal pain, excessive heart burn, GU symptoms (dysuria, hematuria, voiding/incontinence issues) syncope, focal weakness, memory loss, numbness & tingling, skin/hair/nail changes, depression, anxiety, abnormal bruising/bleeding, musculoskeletal symptoms/signs.     Objective:   Physical Exam General Appearance:    Alert, cooperative, no distress, appears stated age  Head:    Normocephalic, without obvious abnormality, atraumatic  Eyes:    PERRL, conjunctiva/corneas clear, EOM's intact both eyes       Ears:    Normal TM's and external ear canals, both ears  Nose:   Nares normal, septum midline, mucosa normal, no drainage   or sinus tenderness  Throat:   Lips, mucosa, and tongue normal; teeth and gums normal  Neck:   Supple, symmetrical, trachea midline, no adenopathy;       thyroid:  No enlargement/tenderness/nodules  Back:     Symmetric, no curvature, ROM normal, no  CVA tenderness  Lungs:     Clear to auscultation bilaterally, respirations unlabored  Chest wall:    No tenderness or deformity  Heart:    Regular rate and rhythm, S1 and S2 normal, no murmur, rub   or gallop  Abdomen:     Soft, non-tender, bowel sounds active all four quadrants,    no masses, no organomegaly  Genitalia:    deferred  Rectal:    Extremities:   Extremities normal, atraumatic, no cyanosis or edema  Pulses:   2+ and symmetric all extremities  Skin:   Skin color, texture, turgor normal, no rashes or lesions  Lymph nodes:   Cervical, supraclavicular, and axillary nodes normal  Neurologic:   CNII-XII intact. Normal strength, sensation and reflexes      throughout          Assessment & Plan:

## 2022-11-25 ENCOUNTER — Telehealth: Payer: Self-pay

## 2022-11-25 NOTE — Telephone Encounter (Signed)
-----   Message from Neena Rhymes sent at 11/25/2022  7:44 AM EDT ----- Labs look great!  No changes at this time

## 2022-12-13 ENCOUNTER — Other Ambulatory Visit (HOSPITAL_BASED_OUTPATIENT_CLINIC_OR_DEPARTMENT_OTHER): Payer: Self-pay

## 2023-03-15 ENCOUNTER — Other Ambulatory Visit (HOSPITAL_BASED_OUTPATIENT_CLINIC_OR_DEPARTMENT_OTHER): Payer: Self-pay

## 2023-03-15 ENCOUNTER — Other Ambulatory Visit: Payer: Self-pay

## 2023-03-16 ENCOUNTER — Other Ambulatory Visit: Payer: Self-pay

## 2023-03-17 ENCOUNTER — Other Ambulatory Visit: Payer: Self-pay

## 2023-04-14 DIAGNOSIS — H5213 Myopia, bilateral: Secondary | ICD-10-CM | POA: Diagnosis not present

## 2023-04-18 ENCOUNTER — Other Ambulatory Visit (HOSPITAL_BASED_OUTPATIENT_CLINIC_OR_DEPARTMENT_OTHER): Payer: Self-pay

## 2023-04-18 ENCOUNTER — Other Ambulatory Visit: Payer: Self-pay | Admitting: Gastroenterology

## 2023-04-18 MED ORDER — MESALAMINE 1.2 G PO TBEC
2.4000 g | DELAYED_RELEASE_TABLET | Freq: Every day | ORAL | 1 refills | Status: DC
  Filled 2023-04-18: qty 180, 90d supply, fill #0
  Filled 2023-08-04: qty 180, 90d supply, fill #1

## 2023-06-06 ENCOUNTER — Other Ambulatory Visit (HOSPITAL_BASED_OUTPATIENT_CLINIC_OR_DEPARTMENT_OTHER): Payer: Self-pay

## 2023-06-06 ENCOUNTER — Other Ambulatory Visit: Payer: Self-pay | Admitting: Family Medicine

## 2023-06-06 MED ORDER — ATORVASTATIN CALCIUM 10 MG PO TABS
10.0000 mg | ORAL_TABLET | Freq: Every day | ORAL | 1 refills | Status: DC
  Filled 2023-06-06: qty 90, 90d supply, fill #0
  Filled 2023-09-06: qty 90, 90d supply, fill #1

## 2023-07-19 ENCOUNTER — Encounter: Payer: Self-pay | Admitting: Gastroenterology

## 2023-08-22 ENCOUNTER — Encounter: Payer: Self-pay | Admitting: Sports Medicine

## 2023-08-22 ENCOUNTER — Other Ambulatory Visit (HOSPITAL_BASED_OUTPATIENT_CLINIC_OR_DEPARTMENT_OTHER): Payer: Self-pay

## 2023-08-22 ENCOUNTER — Ambulatory Visit

## 2023-08-22 ENCOUNTER — Ambulatory Visit: Admitting: Sports Medicine

## 2023-08-22 DIAGNOSIS — M75101 Unspecified rotator cuff tear or rupture of right shoulder, not specified as traumatic: Secondary | ICD-10-CM | POA: Insufficient documentation

## 2023-08-22 DIAGNOSIS — M25511 Pain in right shoulder: Secondary | ICD-10-CM

## 2023-08-22 DIAGNOSIS — S46011A Strain of muscle(s) and tendon(s) of the rotator cuff of right shoulder, initial encounter: Secondary | ICD-10-CM

## 2023-08-22 DIAGNOSIS — M19011 Primary osteoarthritis, right shoulder: Secondary | ICD-10-CM | POA: Diagnosis not present

## 2023-08-22 MED ORDER — TRIAZOLAM 0.25 MG PO TABS
0.2500 mg | ORAL_TABLET | Freq: Once | ORAL | 0 refills | Status: AC
  Filled 2023-08-22: qty 2, 1d supply, fill #0

## 2023-08-22 NOTE — Assessment & Plan Note (Signed)
 Very pleasant 63 year old male, 2 days ago he was using an Building services engineer, it caught and jerked his right arm, since then he has had pain and inability to abduct his shoulder. On exam he has profound weakness to external rotation and abduction, positive drop arm sign, positive painful arc sign, neurovascular intact distally, he has greater than 4 metabolic equivalents of cardiac capacity. Suspect rotator cuff tear, traumatic, due to the acute nature of this I do think we need urgent surgical intervention. Adding x-rays, MRI with sedation for claustrophobia. At also like a consult with Dr. Yvonne Hering.

## 2023-08-22 NOTE — Progress Notes (Signed)
    Procedures performed today:    None.  Independent interpretation of notes and tests performed by another provider:   None.  Brief History, Exam, Impression, and Recommendations:    Rotator cuff tear, right Very pleasant 63 year old male, 2 days ago he was using an Building services engineer, it caught and jerked his right arm, since then he has had pain and inability to abduct his shoulder. On exam he has profound weakness to external rotation and abduction, positive drop arm sign, positive painful arc sign, neurovascular intact distally, he has greater than 4 metabolic equivalents of cardiac capacity. Suspect rotator cuff tear, traumatic, due to the acute nature of this I do think we need urgent surgical intervention. Adding x-rays, MRI with sedation for claustrophobia. At also like a consult with Dr. Yvonne Hering.    ____________________________________________ Joselyn Nicely. Sandy Crumb, M.D., ABFM., CAQSM., AME. Primary Care and Sports Medicine [facility]  Adjunct Professor of [facility] [employer] Medicine  [employer]  Restaurant manager, fast food

## 2023-08-23 ENCOUNTER — Ambulatory Visit

## 2023-08-23 DIAGNOSIS — M25511 Pain in right shoulder: Secondary | ICD-10-CM

## 2023-08-23 DIAGNOSIS — M25411 Effusion, right shoulder: Secondary | ICD-10-CM | POA: Diagnosis not present

## 2023-08-23 DIAGNOSIS — M7581 Other shoulder lesions, right shoulder: Secondary | ICD-10-CM | POA: Diagnosis not present

## 2023-08-23 DIAGNOSIS — S46011A Strain of muscle(s) and tendon(s) of the rotator cuff of right shoulder, initial encounter: Secondary | ICD-10-CM

## 2023-08-23 DIAGNOSIS — M75121 Complete rotator cuff tear or rupture of right shoulder, not specified as traumatic: Secondary | ICD-10-CM | POA: Diagnosis not present

## 2023-08-23 DIAGNOSIS — M75101 Unspecified rotator cuff tear or rupture of right shoulder, not specified as traumatic: Secondary | ICD-10-CM | POA: Diagnosis not present

## 2023-08-29 ENCOUNTER — Ambulatory Visit (HOSPITAL_BASED_OUTPATIENT_CLINIC_OR_DEPARTMENT_OTHER): Admitting: Orthopaedic Surgery

## 2023-08-30 DIAGNOSIS — R2231 Localized swelling, mass and lump, right upper limb: Secondary | ICD-10-CM | POA: Diagnosis not present

## 2023-09-19 ENCOUNTER — Other Ambulatory Visit: Payer: Self-pay

## 2023-09-19 ENCOUNTER — Encounter (HOSPITAL_BASED_OUTPATIENT_CLINIC_OR_DEPARTMENT_OTHER): Payer: Self-pay | Admitting: Orthopaedic Surgery

## 2023-09-21 NOTE — H&P (Signed)
 PREOPERATIVE H&P  Chief Complaint: cartilage disorder of right shoulder, Impingement syndrome of shoulder, right, Bicipital tendinitis of shoulder, right, Strain of tendon of right rotator cuff  HPI: Steven Torres is a 63 y.o. male who is scheduled for, Procedure(s): ARTHROSCOPY, SHOULDER, WITH ROTATOR CUFF REPAIR DECOMPRESSION, SUBACROMIAL SPACE TENODESIS, BICEPS ARTHROSCOPY, SHOULDER WITH DEBRIDEMENT.   Patient has a past medical history significant for UC and GERD.   Patient has had pain since a fall when digging a hole in April. He had immediate pain and inability to lift his arm.   Symptoms are rated as moderate to severe, and have been worsening.  This is significantly impairing activities of daily living.    Please see clinic note for further details on this patient's care.    He has elected for surgical management.   Past Medical History:  Diagnosis Date   Allergy    Anxiety    Chronic kidney disease    patient denies   GERD (gastroesophageal reflux disease)    History of kidney stones    Hyperlipidemia    Renal stone    Ulcerative colitis [facility]    Past Surgical History:  Procedure Laterality Date   APPENDECTOMY     BACK SURGERY     EXTRACORPOREAL SHOCK WAVE LITHOTRIPSY Left 11/29/2016   Procedure: LEFT EXTRACORPOREAL SHOCK WAVE LITHOTRIPSY (ESWL);  Surgeon: Florencio Hunting, MD;  Location: [facility];  Service: Urology;  Laterality: Left;   HERNIA REPAIR     KNEE ARTHROSCOPY     2- both on right knee   LAPAROSCOPIC APPENDECTOMY N/A 03/16/2022   Procedure: APPENDECTOMY LAPAROSCOPIC;  Surgeon: Stechschulte, Avon Boers, MD;  Location: [facility] OR;  Service: General;  Laterality: N/A;   WISDOM TOOTH EXTRACTION     Social History   Socioeconomic History   Marital status: Married    Spouse name: Not on file   Number of children: 2   Years of education: Not on file   Highest education level: Not on file  Occupational History   Occupation: Scientist, water quality  Tobacco Use    Smoking status: Never   Smokeless tobacco: Never  Vaping Use   Vaping status: Never Used  Substance and Sexual Activity   Alcohol use: No   Drug use: No   Sexual activity: Not on file  Other Topics Concern   Not on file  Social History Narrative   Not on file   Social Drivers of Health   Financial Resource Strain: Not on file  Food Insecurity: Not on file  Transportation Needs: Not on file  Physical Activity: Not on file  Stress: Not on file  Social Connections: Not on file   Family History  Problem Relation Age of Onset   Prostate cancer Father    Colon polyps Mother        benign   Heart attack Mother    Colon cancer Neg Hx    Rectal cancer Neg Hx    Stomach cancer Neg Hx    Esophageal cancer Neg Hx    No Known Allergies Prior to Admission medications   Medication Sig Start Date End Date Taking? Authorizing Provider  atorvastatin  (LIPITOR) 10 MG tablet Take 1 tablet (10 mg total) by mouth daily. 06/06/23  Yes Jess Morita, MD  mesalamine  (LIALDA ) 1.2 g EC tablet Take 2 tablets (2.4 g total) by mouth daily with breakfast. 04/18/23  Yes Armbruster, Lendon Queen, MD  acetaminophen  (TYLENOL ) 650 MG CR tablet Take 1 tablet (650  mg total) by mouth every 8 (eight) hours as needed for pain. Patient not taking: Reported on 08/22/2023 03/05/22   Gean Keels, MD  aspirin EC 81 MG tablet Take by mouth. Patient not taking: Reported on 08/22/2023    [provider]  Cholecalciferol (VITAMIN D ) 2000 UNITS CAPS Take 1 capsule by mouth daily. Patient not taking: Reported on 08/22/2023    [provider]  GLUCOSAMINE-CHONDROITIN PO Take 1 tablet by mouth daily. Patient not taking: Reported on 08/22/2023    [provider]  Multiple Vitamins-Minerals (MULTIVITAMIN ADULT PO) Take by mouth daily. Patient not taking: Reported on 08/22/2023    [provider]  triazolam  (HALCION ) 0.25 MG tablet Take 1-2 tablets by mouth 2 hours before procedure  or imaging.  Do not drive with this medication. 08/22/23 08/23/23  Gean Keels, MD  Turmeric (QC TUMERIC COMPLEX) 500 MG CAPS Herbal Name: tumeric Patient not taking: Reported on 08/22/2023    [provider]    ROS: All other systems have been reviewed and were otherwise negative with the exception of those mentioned in the HPI and as above.  Physical Exam: General: Alert, no acute distress Cardiovascular: No pedal edema Respiratory: No cyanosis, no use of accessory musculature GI: No organomegaly, abdomen is soft and non-tender Skin: No lesions in the area of chief complaint Neurologic: Sensation intact distally Psychiatric: Patient is competent for consent with normal mood and affect Lymphatic: No axillary or cervical lymphadenopathy  MUSCULOSKELETAL:  RUE: active forward elevation to 30 degrees. Passive to 170. Gross weakness with supraspinatus and infraspinatus testing. Positive drop arm. TTP bicipital groove. Negative AC tenderness.   Imaging: MRI of the right shoulder demonstrates massive retracted rotator cuff tear. Fluid around the biceps. Lateral hanging acromial spur.   Assessment: cartilage disorder of right shoulder, Impingement syndrome of shoulder, right, Bicipital tendinitis of shoulder, right, Strain of tendon of right rotator cuff  Plan: Plan for Procedure(s): ARTHROSCOPY, SHOULDER, WITH ROTATOR CUFF REPAIR DECOMPRESSION, SUBACROMIAL SPACE TENODESIS, BICEPS ARTHROSCOPY, SHOULDER WITH DEBRIDEMENT  The risks benefits and alternatives were discussed with the patient including but not limited to the risks of nonoperative treatment, versus surgical intervention including infection, bleeding, nerve injury,  blood clots, cardiopulmonary complications, morbidity, mortality, among others, and they were willing to proceed.   The patient acknowledged the explanation, agreed to proceed with the plan and consent was signed.   Operative Plan: Right shoulder  scope with SAD, BT, RCR Discharge Medications: standard DVT Prophylaxis: none Physical Therapy: outpatient Special Discharge needs: Sling (should bring with him). ICeMan    Adine Ahmadi, PA-C  09/21/2023 11:24 AM

## 2023-09-21 NOTE — Anesthesia Preprocedure Evaluation (Addendum)
 Anesthesia Evaluation  Patient identified by MRN, date of birth, ID band Patient awake    Reviewed: Allergy & Precautions, NPO status , Patient's Chart, lab work & pertinent test results  History of Anesthesia Complications Negative for: history of anesthetic complications  Airway Mallampati: I  TM Distance: >3 FB Neck ROM: Full    Dental  (+) Dental Advisory Given, Teeth Intact   Pulmonary neg pulmonary ROS   Pulmonary exam normal        Cardiovascular negative cardio ROS Normal cardiovascular exam     Neuro/Psych  PSYCHIATRIC DISORDERS Anxiety     negative neurological ROS     GI/Hepatic Neg liver ROS, PUD,GERD  Controlled,, UC    Endo/Other  negative endocrine ROS    Renal/GU Renal disease     Musculoskeletal  (+) Arthritis ,    Abdominal   Peds  Hematology negative hematology ROS (+)   Anesthesia Other Findings   Reproductive/Obstetrics                             Anesthesia Physical Anesthesia Plan  ASA: 2  Anesthesia Plan: General   Post-op Pain Management: Regional block* and Tylenol  PO (pre-op)*   Induction: Intravenous  PONV Risk Score and Plan: 2 and Treatment may vary due to age or medical condition, Ondansetron , Dexamethasone  and Midazolam   Airway Management Planned: Oral ETT  Additional Equipment: None  Intra-op Plan:   Post-operative Plan: Extubation in OR  Informed Consent: I have reviewed the patients History and Physical, chart, labs and discussed the procedure including the risks, benefits and alternatives for the proposed anesthesia with the patient or authorized representative who has indicated his/her understanding and acceptance.     Dental advisory given  Plan Discussed with: CRNA and Anesthesiologist  Anesthesia Plan Comments:        Anesthesia Quick Evaluation

## 2023-09-21 NOTE — Discharge Instructions (Addendum)
 Steven Lawrence MD, MPH Nicholas Bari, PA-C [facility] [address] [phone] (tel)   [phone] (fax)   POST-OPERATIVE INSTRUCTIONS - SHOULDER ARTHROSCOPY  WOUND CARE You may remove the Operative Dressing on Post-Op Day #3 (72hrs after surgery).   Alternatively if you would like you can leave dressing on until follow-up if within 7-8 days but keep it dry. Leave steri-strips in place until they fall off on their own, usually 2 weeks postop. There may be a small amount of fluid/bleeding leaking at the surgical site.  This is normal; the shoulder is filled with fluid during the procedure and can leak for 24-48hrs after surgery.  You may change/reinforce the bandage as needed.  Use the Cryocuff or Ice as often as possible for the first 7 days, then as needed for pain relief. Always keep a towel, ACE wrap or other barrier between the cooling unit and your skin.  You may shower on Post-Op Day #3. Gently pat the area dry.  Do not soak the shoulder in water or submerge it.  Keep incisions as dry as possible. Do not go swimming in the pool or ocean until 4 weeks after surgery or when otherwise instructed.    EXERCISES Wear the sling at all times  You may remove the sling for showering, but keep the arm across the chest or in a secondary sling.     It is normal for your fingers/hand to become more swollen after surgery and discolored from bruising.   This will resolve over the first few weeks usually after surgery. Please continue to ambulate and do not stay sitting or lying for too long.  Perform foot and wrist pumps to assist in circulation.  PHYSICAL THERAPY - You will begin physical therapy soon after surgery (unless otherwise specified) - Please call to set up an appointment, if you do not already have one  - Let our office if there are any issues with scheduling your therapy  - You have a physical therapy appointment scheduled at [facility] (across  the hall from our office) on 5/27  REGIONAL ANESTHESIA (NERVE BLOCKS) The anesthesia team may have performed a nerve block for you this is a great tool used to minimize pain.   The block may start wearing off overnight (between 8-24 hours postop) When the block wears off, your pain may go from nearly zero to the pain you would have had postop without the block. This is an abrupt transition but nothing dangerous is happening.   This can be a challenging period but utilize your as needed pain medications to try and manage this period. We suggest you use the pain medication the first night prior to going to bed, to ease this transition.  You may take an extra dose of narcotic when this happens if needed  POST-OP MEDICATIONS- Multimodal approach to pain control In general your pain will be controlled with a combination of substances.  Prescriptions unless otherwise discussed are electronically sent to your pharmacy.  This is a carefully made plan we use to minimize narcotic use.     Celebrex - Anti-inflammatory medication taken on a scheduled basis Acetaminophen  - Non-narcotic pain medicine taken on a scheduled basis  Oxycodone  - This is a strong narcotic, to be used only on an "as needed" basis for SEVERE pain. Zofran  - take as needed for nausea  FOLLOW-UP If you develop a Fever (>=101.5), Redness or Drainage from the surgical incision site, please call our office  to arrange for an evaluation. Please call the office to schedule a follow-up appointment for your first post-operative appointment, 7-10 days post-operatively.    HELPFUL INFORMATION   You may be more comfortable sleeping in a semi-seated position the first few nights following surgery.  Keep a pillow propped under the elbow and forearm for comfort.  If you have a recliner type of chair it might be beneficial.  If not that is fine too, but it would be helpful to sleep propped up with pillows behind your operated shoulder as well  under your elbow and forearm.  This will reduce pulling on the suture lines.  When dressing, put your operative arm in the sleeve first.  When getting undressed, take your operative arm out last.  Loose fitting, button-down shirts are recommended.  Often in the first days after surgery you may be more comfortable keeping your operative arm under your shirt and not through the sleeve.  You may return to work/school in the next couple of days when you feel up to it.  Desk work and typing in the sling is fine.  We suggest you use the pain medication the first night prior to going to bed, in order to ease any pain when the anesthesia wears off. You should avoid taking pain medications on an empty stomach as it will make you nauseous.  You should wean off your narcotic medicines as soon as you are able.  Most patients will be off narcotics before their first postop appointment.   Do not drink alcoholic beverages or take illicit drugs when taking pain medications.  It is against the law to drive while taking narcotics.  In some states it is against the law to drive while your arm is in a sling.   Pain medication may make you constipated.  Below are a few solutions to try in this order: Decrease the amount of pain medication if you aren't having pain. Drink lots of decaffeinated fluids. Drink prune juice and/or eat dried prunes  If the first 3 don't work start with additional solutions Take Colace - an over-the-counter stool softener Take Senokot - an over-the-counter laxative Take Miralax - a stronger over-the-counter laxative  For more information including helpful videos and documents visit our website:   [email or web address]     Post Anesthesia Home Care Instructions  Activity: Get plenty of rest for the remainder of the day. A responsible individual must stay with you for 24 hours following the procedure.  For the next 24 hours, DO NOT: -Drive a  car -Advertising copywriter -Drink alcoholic beverages -Take any medication unless instructed by your physician -Make any legal decisions or sign important papers.  Meals: Start with liquid foods such as gelatin or soup. Progress to regular foods as tolerated. Avoid greasy, spicy, heavy foods. If nausea and/or vomiting occur, drink only clear liquids until the nausea and/or vomiting subsides. Call your physician if vomiting continues.  Special Instructions/Symptoms: Your throat may feel dry or sore from the anesthesia or the breathing tube placed in your throat during surgery. If this causes discomfort, gargle with warm salt water. The discomfort should disappear within 24 hours.  If you had a scopolamine patch placed behind your ear for the management of post- operative nausea and/or vomiting:  1. The medication in the patch is effective for 72 hours, after which it should be removed.  Wrap patch in a tissue and discard in the trash. Wash hands thoroughly with soap and water. 2. You may remove  the patch earlier than 72 hours if you experience unpleasant side effects which may include dry mouth, dizziness or visual disturbances. 3. Avoid touching the patch. Wash your hands with soap and water after contact with the patch.    Regional Anesthesia Blocks  1. You may not be able to move or feel the "blocked" extremity after a regional anesthetic block. This may last may last from 3-48 hours after placement, but it will go away. The length of time depends on the medication injected and your individual response to the medication. As the nerves start to wake up, you may experience tingling as the movement and feeling returns to your extremity. If the numbness and inability to move your extremity has not gone away after 48 hours, please call your surgeon.   2. The extremity that is blocked will need to be protected until the numbness is gone and the strength has returned. Because you cannot feel it, you  will need to take extra care to avoid injury. Because it may be weak, you may have difficulty moving it or using it. You may not know what position it is in without looking at it while the block is in effect.  3. For blocks in the legs and feet, returning to weight bearing and walking needs to be done carefully. You will need to wait until the numbness is entirely gone and the strength has returned. You should be able to move your leg and foot normally before you try and bear weight or walk. You will need someone to be with you when you first try to ensure you do not fall and possibly risk injury.  4. Bruising and tenderness at the needle site are common side effects and will resolve in a few days.  5. Persistent numbness or new problems with movement should be communicated to the surgeon or the [facility] [phone] [facility] [phone]).  Information for Discharge Teaching: EXPAREL  (bupivacaine  liposome injectable suspension)   Pain relief is important to your recovery. The goal is to control your pain so you can move easier and return to your normal activities as soon as possible after your procedure. Your physician may use several types of medicines to manage pain, swelling, and more.  Your surgeon or anesthesiologist gave you EXPAREL (bupivacaine ) to help control your pain after surgery.  EXPAREL  is a local anesthetic designed to release slowly over an extended period of time to provide pain relief by numbing the tissue around the surgical site. EXPAREL  is designed to release pain medication over time and can control pain for up to 72 hours. Depending on how you respond to EXPAREL , you may require less pain medication during your recovery. EXPAREL  can help reduce or eliminate the need for opioids during the first few days after surgery when pain relief is needed the most. EXPAREL  is not an opioid and is not addictive. It does not cause sleepiness or  sedation.   Important! A teal colored band has been placed on your arm with the date, time and amount of EXPAREL  you have received. Please leave this armband in place for the full 96 hours following administration, and then you may remove the band. If you return to the hospital for any reason within 96 hours following the administration of EXPAREL , the armband provides important information that your health care providers to know, and alerts them that you have received this anesthetic.    Possible side effects of EXPAREL : Temporary loss of sensation  or ability to move in the area where medication was injected. Nausea, vomiting, constipation Rarely, numbness and tingling in your mouth or lips, lightheadedness, or anxiety may occur. Call your doctor right away if you think you may be experiencing any of these sensations, or if you have other questions regarding possible side effects.  Follow all other discharge instructions given to you by your surgeon or nurse. Eat a healthy diet and drink plenty of water or other fluids.  Next dose of tylenol  if needed 12:30pm

## 2023-09-22 ENCOUNTER — Other Ambulatory Visit (HOSPITAL_BASED_OUTPATIENT_CLINIC_OR_DEPARTMENT_OTHER): Payer: Self-pay

## 2023-09-22 ENCOUNTER — Ambulatory Visit (HOSPITAL_BASED_OUTPATIENT_CLINIC_OR_DEPARTMENT_OTHER): Admitting: Anesthesiology

## 2023-09-22 ENCOUNTER — Ambulatory Visit (HOSPITAL_BASED_OUTPATIENT_CLINIC_OR_DEPARTMENT_OTHER)
Admission: RE | Admit: 2023-09-22 | Discharge: 2023-09-22 | Disposition: A | Discharge status: Home / Self Care | Attending: Orthopaedic Surgery | Admitting: Orthopaedic Surgery

## 2023-09-22 ENCOUNTER — Encounter (HOSPITAL_BASED_OUTPATIENT_CLINIC_OR_DEPARTMENT_OTHER)
Admission: RE | Disposition: A | Discharge status: Home / Self Care | Payer: Self-pay | Source: Home / Self Care | Attending: Orthopaedic Surgery

## 2023-09-22 ENCOUNTER — Encounter (HOSPITAL_BASED_OUTPATIENT_CLINIC_OR_DEPARTMENT_OTHER): Payer: Self-pay | Admitting: Orthopaedic Surgery

## 2023-09-22 ENCOUNTER — Other Ambulatory Visit: Payer: Self-pay

## 2023-09-22 DIAGNOSIS — M24111 Other articular cartilage disorders, right shoulder: Secondary | ICD-10-CM | POA: Diagnosis not present

## 2023-09-22 DIAGNOSIS — M19011 Primary osteoarthritis, right shoulder: Secondary | ICD-10-CM | POA: Insufficient documentation

## 2023-09-22 DIAGNOSIS — M778 Other enthesopathies, not elsewhere classified: Secondary | ICD-10-CM | POA: Insufficient documentation

## 2023-09-22 DIAGNOSIS — M7521 Bicipital tendinitis, right shoulder: Secondary | ICD-10-CM | POA: Diagnosis not present

## 2023-09-22 DIAGNOSIS — S43431A Superior glenoid labrum lesion of right shoulder, initial encounter: Secondary | ICD-10-CM | POA: Insufficient documentation

## 2023-09-22 DIAGNOSIS — Z8711 Personal history of peptic ulcer disease: Secondary | ICD-10-CM | POA: Insufficient documentation

## 2023-09-22 DIAGNOSIS — N289 Disorder of kidney and ureter, unspecified: Secondary | ICD-10-CM | POA: Insufficient documentation

## 2023-09-22 DIAGNOSIS — W19XXXA Unspecified fall, initial encounter: Secondary | ICD-10-CM | POA: Insufficient documentation

## 2023-09-22 DIAGNOSIS — G8918 Other acute postprocedural pain: Secondary | ICD-10-CM | POA: Diagnosis not present

## 2023-09-22 DIAGNOSIS — M75101 Unspecified rotator cuff tear or rupture of right shoulder, not specified as traumatic: Secondary | ICD-10-CM

## 2023-09-22 DIAGNOSIS — K219 Gastro-esophageal reflux disease without esophagitis: Secondary | ICD-10-CM | POA: Insufficient documentation

## 2023-09-22 DIAGNOSIS — S46011A Strain of muscle(s) and tendon(s) of the rotator cuff of right shoulder, initial encounter: Secondary | ICD-10-CM | POA: Diagnosis not present

## 2023-09-22 DIAGNOSIS — Y93H1 Activity, digging, shoveling and raking: Secondary | ICD-10-CM | POA: Insufficient documentation

## 2023-09-22 DIAGNOSIS — M7541 Impingement syndrome of right shoulder: Secondary | ICD-10-CM | POA: Diagnosis not present

## 2023-09-22 DIAGNOSIS — M75121 Complete rotator cuff tear or rupture of right shoulder, not specified as traumatic: Secondary | ICD-10-CM | POA: Diagnosis not present

## 2023-09-22 HISTORY — PX: BICEPT TENODESIS: SHX5116

## 2023-09-22 HISTORY — PX: SUBACROMIAL DECOMPRESSION: SHX5174

## 2023-09-22 HISTORY — PX: SHOULDER ARTHROSCOPY WITH ROTATOR CUFF REPAIR: SHX5685

## 2023-09-22 HISTORY — PX: POSTERIOR LUMBAR FUSION 2 WITH HARDWARE REMOVAL: SHX7297

## 2023-09-22 SURGERY — ARTHROSCOPY, SHOULDER, WITH ROTATOR CUFF REPAIR
Anesthesia: General | Laterality: Right

## 2023-09-22 MED ORDER — GABAPENTIN 300 MG PO CAPS
ORAL_CAPSULE | ORAL | Status: AC
  Filled 2023-09-22: qty 1

## 2023-09-22 MED ORDER — ACETAMINOPHEN 500 MG PO TABS
1000.0000 mg | ORAL_TABLET | Freq: Three times a day (TID) | ORAL | 0 refills | Status: AC
Start: 2023-09-22 — End: 2023-10-06
  Filled 2023-09-22: qty 84, 14d supply, fill #0

## 2023-09-22 MED ORDER — PROPOFOL 10 MG/ML IV BOLUS
INTRAVENOUS | Status: AC
  Filled 2023-09-22: qty 20

## 2023-09-22 MED ORDER — CEFAZOLIN SODIUM-DEXTROSE 2-4 GM/100ML-% IV SOLN
INTRAVENOUS | Status: AC
  Filled 2023-09-22: qty 100

## 2023-09-22 MED ORDER — CELECOXIB 100 MG PO CAPS
100.0000 mg | ORAL_CAPSULE | Freq: Two times a day (BID) | ORAL | 0 refills | Status: AC
  Filled 2023-09-22: qty 60, 30d supply, fill #0

## 2023-09-22 MED ORDER — ROCURONIUM BROMIDE 100 MG/10ML IV SOLN
INTRAVENOUS | Status: DC | PRN
  Administered 2023-09-22: 50 mg via INTRAVENOUS

## 2023-09-22 MED ORDER — DEXAMETHASONE SODIUM PHOSPHATE 10 MG/ML IJ SOLN
INTRAMUSCULAR | Status: AC
  Filled 2023-09-22: qty 1

## 2023-09-22 MED ORDER — LIDOCAINE 2% (20 MG/ML) 5 ML SYRINGE
INTRAMUSCULAR | Status: DC | PRN
  Administered 2023-09-22: 40 mg via INTRAVENOUS

## 2023-09-22 MED ORDER — ONDANSETRON HCL 4 MG PO TABS
4.0000 mg | ORAL_TABLET | Freq: Three times a day (TID) | ORAL | 0 refills | Status: AC | PRN
  Filled 2023-09-22: qty 10, 4d supply, fill #0

## 2023-09-22 MED ORDER — OXYCODONE HCL 5 MG PO TABS
ORAL_TABLET | ORAL | 0 refills | Status: AC
  Filled 2023-09-22: qty 30, 4d supply, fill #0

## 2023-09-22 MED ORDER — FENTANYL CITRATE (PF) 100 MCG/2ML IJ SOLN
INTRAMUSCULAR | Status: AC
  Filled 2023-09-22: qty 2

## 2023-09-22 MED ORDER — SUGAMMADEX SODIUM 200 MG/2ML IV SOLN
INTRAVENOUS | Status: DC | PRN
  Administered 2023-09-22: 200 mg via INTRAVENOUS

## 2023-09-22 MED ORDER — GABAPENTIN 300 MG PO CAPS
300.0000 mg | ORAL_CAPSULE | Freq: Once | ORAL | Status: AC
  Administered 2023-09-22: 300 mg via ORAL

## 2023-09-22 MED ORDER — EPINEPHRINE PF 1 MG/ML IJ SOLN
INTRAMUSCULAR | Status: AC
  Filled 2023-09-22: qty 4

## 2023-09-22 MED ORDER — LACTATED RINGERS IV SOLN: INTRAVENOUS | Status: DC

## 2023-09-22 MED ORDER — EPHEDRINE SULFATE (PRESSORS) 50 MG/ML IJ SOLN
INTRAMUSCULAR | Status: DC | PRN
  Administered 2023-09-22 (×4): 5 mg via INTRAVENOUS

## 2023-09-22 MED ORDER — ONDANSETRON HCL 4 MG/2ML IJ SOLN
INTRAMUSCULAR | Status: DC | PRN
  Administered 2023-09-22: 4 mg via INTRAVENOUS

## 2023-09-22 MED ORDER — ACETAMINOPHEN 500 MG PO TABS: 1000.0000 mg | ORAL_TABLET | Freq: Once | ORAL | Status: AC

## 2023-09-22 MED ORDER — ACETAMINOPHEN 500 MG PO TABS
1000.0000 mg | ORAL_TABLET | Freq: Once | ORAL | Status: AC
  Administered 2023-09-22: 1000 mg via ORAL

## 2023-09-22 MED ORDER — MIDAZOLAM HCL 2 MG/2ML IJ SOLN
INTRAMUSCULAR | Status: AC
  Filled 2023-09-22: qty 2

## 2023-09-22 MED ORDER — ONDANSETRON HCL 4 MG/2ML IJ SOLN
INTRAMUSCULAR | Status: AC
  Filled 2023-09-22: qty 2

## 2023-09-22 MED ORDER — FENTANYL CITRATE (PF) 100 MCG/2ML IJ SOLN: 25.0000 ug | INTRAMUSCULAR | Status: DC | PRN

## 2023-09-22 MED ORDER — ACETAMINOPHEN 500 MG PO TABS
ORAL_TABLET | ORAL | Status: AC
Start: 2023-09-22 — End: ?
  Filled 2023-09-22: qty 2

## 2023-09-22 MED ORDER — CEFAZOLIN SODIUM-DEXTROSE 2-4 GM/100ML-% IV SOLN
2.0000 g | INTRAVENOUS | Status: AC
  Administered 2023-09-22: 2 g via INTRAVENOUS

## 2023-09-22 MED ORDER — SODIUM CHLORIDE 0.9 % IV SOLN: 12.5000 mg | INTRAVENOUS | Status: DC | PRN

## 2023-09-22 MED ORDER — ROCURONIUM BROMIDE 10 MG/ML (PF) SYRINGE
PREFILLED_SYRINGE | INTRAVENOUS | Status: AC
  Filled 2023-09-22: qty 10

## 2023-09-22 MED ORDER — EPHEDRINE 5 MG/ML INJ
INTRAVENOUS | Status: AC
  Filled 2023-09-22: qty 5

## 2023-09-22 MED ORDER — DEXAMETHASONE SODIUM PHOSPHATE 10 MG/ML IJ SOLN
INTRAMUSCULAR | Status: DC | PRN
  Administered 2023-09-22: 10 mg via INTRAVENOUS

## 2023-09-22 MED ORDER — MIDAZOLAM HCL 2 MG/2ML IJ SOLN
1.0000 mg | Freq: Once | INTRAMUSCULAR | Status: AC
  Administered 2023-09-22: 1 mg via INTRAVENOUS

## 2023-09-22 MED ORDER — FENTANYL CITRATE (PF) 100 MCG/2ML IJ SOLN
INTRAMUSCULAR | Status: DC | PRN
  Administered 2023-09-22: 50 ug via INTRAVENOUS

## 2023-09-22 MED ORDER — SODIUM CHLORIDE 0.9 % IR SOLN: Status: DC | PRN

## 2023-09-22 MED ORDER — PROPOFOL 10 MG/ML IV BOLUS
INTRAVENOUS | Status: DC | PRN
  Administered 2023-09-22: 160 mg via INTRAVENOUS

## 2023-09-22 MED ORDER — BUPIVACAINE LIPOSOME 1.3 % IJ SUSP
INTRAMUSCULAR | Status: DC | PRN
Start: 2023-09-22 — End: 2023-09-22
  Administered 2023-09-22: 10 mL via PERINEURAL

## 2023-09-22 MED ORDER — LIDOCAINE 2% (20 MG/ML) 5 ML SYRINGE
INTRAMUSCULAR | Status: AC
  Filled 2023-09-22: qty 5

## 2023-09-22 MED ORDER — AMISULPRIDE (ANTIEMETIC) 5 MG/2ML IV SOLN: 10.0000 mg | Freq: Once | INTRAVENOUS | Status: DC | PRN

## 2023-09-22 MED ORDER — FENTANYL CITRATE (PF) 100 MCG/2ML IJ SOLN
50.0000 ug | Freq: Once | INTRAMUSCULAR | Status: AC
  Administered 2023-09-22: 50 ug via INTRAVENOUS

## 2023-09-22 MED ORDER — BUPIVACAINE HCL (PF) 0.5 % IJ SOLN
INTRAMUSCULAR | Status: DC | PRN
  Administered 2023-09-22: 15 mL via PERINEURAL

## 2023-09-22 MED ORDER — OXYCODONE HCL 5 MG PO TABS: 5.0000 mg | ORAL_TABLET | Freq: Once | ORAL | Status: DC | PRN

## 2023-09-22 MED ORDER — OXYCODONE HCL 5 MG/5ML PO SOLN: 5.0000 mg | Freq: Once | ORAL | Status: DC | PRN

## 2023-09-22 SURGICAL SUPPLY — 49 items
ANCHOR FIBERTAK RC 2.6 (BLUE) (Anchor) IMPLANT
ANCHOR SUT 1.8 FIBERTAK SB KL (Anchor) IMPLANT
ANCHOR SUT BIO SW 4.75X19.1 (Anchor) IMPLANT
ANCHOR SUT FBRTK 2.6X1.7X2 (Anchor) IMPLANT
BLADE EXCALIBUR 4.0X13 (MISCELLANEOUS) ×1 IMPLANT
BURR OVAL 8 FLU 4.0X13 (MISCELLANEOUS) ×1 IMPLANT
CANNULA 5.75X71 LONG (CANNULA) IMPLANT
CANNULA PASSPORT 5 (CANNULA) IMPLANT
CANNULA PASSPORT BUTTON 10-40 (CANNULA) IMPLANT
CANNULA TWIST IN 8.25X7CM (CANNULA) IMPLANT
CHLORAPREP W/TINT 26 (MISCELLANEOUS) ×1 IMPLANT
CLSR STERI-STRIP ANTIMIC 1/2X4 (GAUZE/BANDAGES/DRESSINGS) ×1 IMPLANT
COOLER ICEMAN CLASSIC (MISCELLANEOUS) ×1 IMPLANT
DRAPE IMP U-DRAPE 54X76 (DRAPES) ×1 IMPLANT
DRAPE INCISE IOBAN 66X45 STRL (DRAPES) IMPLANT
DRAPE SHOULDER BEACH CHAIR (DRAPES) ×1 IMPLANT
DW OUTFLOW CASSETTE/TUBE SET (MISCELLANEOUS) ×1 IMPLANT
GAUZE PAD ABD 8X10 STRL (GAUZE/BANDAGES/DRESSINGS) ×1 IMPLANT
GAUZE SPONGE 4X4 12PLY STRL (GAUZE/BANDAGES/DRESSINGS) ×1 IMPLANT
GLOVE BIO SURGEON STRL SZ 6.5 (GLOVE) ×1 IMPLANT
GLOVE BIOGEL PI IND STRL 6.5 (GLOVE) ×1 IMPLANT
GLOVE BIOGEL PI IND STRL 8 (GLOVE) ×1 IMPLANT
GLOVE ECLIPSE 8.0 STRL XLNG CF (GLOVE) ×1 IMPLANT
GOWN STRL REUS W/ TWL LRG LVL3 (GOWN DISPOSABLE) ×2 IMPLANT
GOWN STRL REUS W/TWL XL LVL3 (GOWN DISPOSABLE) ×1 IMPLANT
KIT STABILIZATION SHOULDER (MISCELLANEOUS) ×1 IMPLANT
KIT STR SPEAR 1.8 FBRTK DISP (KITS) IMPLANT
LASSO CRESCENT QUICKPASS (SUTURE) IMPLANT
MANIFOLD NEPTUNE II (INSTRUMENTS) ×1 IMPLANT
NDL HD SCORPION MEGA LOADER (NEEDLE) IMPLANT
NDL SAFETY ECLIPSE 18X1.5 (NEEDLE) ×1 IMPLANT
PACK ARTHROSCOPY DSU (CUSTOM PROCEDURE TRAY) ×1 IMPLANT
PACK BASIN DAY SURGERY FS (CUSTOM PROCEDURE TRAY) ×1 IMPLANT
PAD COLD SHLDR WRAP-ON (PAD) ×1 IMPLANT
RESTRAINT HEAD UNIVERSAL NS (MISCELLANEOUS) ×1 IMPLANT
SHEET MEDIUM DRAPE 40X70 STRL (DRAPES) ×1 IMPLANT
SLEEVE SCD COMPRESS KNEE MED (STOCKING) ×1 IMPLANT
SLING ARM FOAM STRAP LRG (SOFTGOODS) IMPLANT
SUT MNCRL AB 4-0 PS2 18 (SUTURE) ×1 IMPLANT
SUT PDS AB 0 CT 36 (SUTURE) IMPLANT
SUT TIGER TAPE 7 IN WHITE (SUTURE) IMPLANT
SUTURE FIBERWR #2 38 T-5 BLUE (SUTURE) IMPLANT
SUTURE TAPE TIGERLINK 1.3MM BL (SUTURE) IMPLANT
SYR 5ML LL (SYRINGE) ×1 IMPLANT
TAPE FIBER 2MM 7IN #2 BLUE (SUTURE) IMPLANT
TOWEL GREEN STERILE FF (TOWEL DISPOSABLE) ×2 IMPLANT
TUBE CONNECTING 20X1/4 (TUBING) ×1 IMPLANT
TUBING ARTHROSCOPY IRRIG 16FT (MISCELLANEOUS) ×1 IMPLANT
WAND ABLATOR APOLLO I90 (BUR) ×1 IMPLANT

## 2023-09-22 NOTE — Anesthesia Procedure Notes (Signed)
 Anesthesia Regional Block: Interscalene brachial plexus block   Pre-Anesthetic Checklist: , timeout performed,  Correct Patient, Correct Site, Correct Laterality,  Correct Procedure, Correct Position, site marked,  Risks and benefits discussed,  Surgical consent,  Pre-op evaluation,  At surgeon's request and post-op pain management  Laterality: Right  Prep: chloraprep       Needles:  Injection technique: Single-shot  Needle Type: Echogenic Needle     Needle Length: 5cm  Needle Gauge: 21     Additional Needles:   Narrative:  Start time: 09/22/2023 7:15 AM End time: 09/22/2023 7:18 AM Injection made incrementally with aspirations every 5 mL.  Performed by: Personally  Anesthesiologist: Juventino Oppenheim, MD  Additional Notes: No pain on injection. No increased resistance to injection. Injection made in 5cc increments. Good needle visualization. Patient tolerated the procedure well.

## 2023-09-22 NOTE — Op Note (Addendum)
 Orthopaedic Surgery Operative Note (CSN: [id number])  Steven Torres  05-Mar-1961 Date of Surgery: 09/22/2023   DIAGNOSES: Right shoulder, acute on chronic rotator cuff tear, SLAP tear, biceps tendinitis, AC arthritis, and subacromial impingement.  POST-OPERATIVE DIAGNOSIS: same  PROCEDURE: Arthroscopic extensive debridement - 29823 Subdeltoid Bursa, Supraspinatus Tendon, Anterior Labrum, and Posterior Labrum Arthroscopic subacromial decompression - 86578 Arthroscopic rotator cuff repair - 46962 Arthroscopic biceps tenodesis - 95284   OPERATIVE FINDING: Massive cuff tear noted, examined anesthesia demonstrated full motion, capsulitis of the anterior capsule was noted, subscapularis was intact, type II SLAP tear, cartilage surfaces intact, anterior posterior and superior labral tearing was noted.  This was debrided back.  There was a large tear of the supra and infraspinatus and the anterior portion of the supraspinatus.  To be more chronic while the posterior aspect of the tear was micro mobile.  We placed a traction stitch we are able to mobilize the tissue after release was and easily get it back to the tuberosity without significant tension.  This was a 3 x 2 superior configuration of the anchor placement and an extra far anterior anchor to help with a dogear.  Patient's quality of tissue was still reasonable and based on his redness I think it is appropriate to consider early therapy to avoid stiffness which is high risk in this patient.  If the patient fails reverse shoulder arthroplasty would be a reasonable option.    Post-operative plan: The patient will be non-weightbearing in a sling for 6 weeks.  The patient will be discharged home.  DVT prophylaxis not indicated in ambulatory upper extremity patient without known risk factors.   Pain control with PRN pain medication preferring oral medicines.  Follow up plan will be scheduled in approximately 7 days for incision check and  XR.  Surgeons:Primary: Micheline Ahr, MD Assistants:Caroline McBane, PA-C, Rozanna Corner RN-FA Location: [facility] OR ROOM 6 Anesthesia: General with Exparel  interscalene block Antibiotics: Ancef 2 g Tourniquet time: None Estimated Blood Loss: Minimal Complications: None Specimens: None Implants: Implant Name Type Inv. Item Serial No. Manufacturer Lot No. LRB No. Used Action  ANCHOR SUT 1.8 FIBERTAK SB KL - [id number] Anchor ANCHOR SUT 1.8 FIBERTAK SB KL  [employer] [id number] Right 1 Implanted  ANCHOR SUT 1.8 FIBERTAK SB KL - [id number] Anchor ANCHOR SUT 1.8 FIBERTAK SB KL  [employer] [id number] Right 1 Implanted  ANCHOR FIBERTAK RC 2.6 [facility]) - [id number] Anchor ANCHOR FIBERTAK RC 2.6 [facility])  [employer] [id number] Right 1 Implanted  ANCHOR SUT BIO SW 4.75X19.1 - [id number] Anchor ANCHOR SUT BIO SW 4.75X19.1  [employer] [id number] Right 1 Implanted  ANCHOR SUT BIO SW 4.75X19.1 - [id number] Anchor ANCHOR SUT BIO SW 4.75X19.1  [employer] [id number] Right 1 Implanted  ANCHOR SUT FBRTK 2.6X1.7X2 - [id number] Anchor ANCHOR SUT FBRTK 2.6X1.7X2  [employer] [id number] Right 1 Implanted  ANCHOR SUT FBRTK 2.6X1.7X2 - [id number] Anchor ANCHOR SUT FBRTK 2.6X1.7X2  [employer] [id number] Right 1 Implanted  ANCHOR SUT 1.8 FIBERTAK SB KL - [id number] Anchor ANCHOR SUT 1.8 [employer] [id number] Right 1 Implanted    Indications for Surgery:   Steven Torres is a 63 y.o. male with continued shoulder pain refractory to nonoperative measures for extended period of time.    The risks and benefits were explained at length including but not limited to continued pain, cuff failure, biceps tenodesis failure, stiffness, need for further surgery and infection.   Procedure:   Patient was correctly identified  in the preoperative holding area and operative site marked.  Patient brought to OR and positioned beachchair on an Jericho table ensuring that all bony prominences were padded and the head was in an appropriate  location.  Anesthesia was induced and the operative shoulder was prepped and draped in the usual sterile fashion.  Timeout was called preincision.  A standard posterior viewing portal was made after localizing the portal with a spinal needle.  An anterior accessory portal was also made.  After clearing the articular space the camera was positioned in the subacromial space.  Findings above.    Extensive debridement was performed of the anterior interval tissue, labral fraying and the bursa.  Glenoid bone, glenoid cartilage, humeral bone were all debrided.  Subacromial decompression: We made a lateral portal with spinal needle guidance. We then proceeded to debride bursal tissue extensively with a shaver and arthrocare device. At that point we continued to identify the borders of the acromion and identify the spur. We then carefully preserved the deltoid fascia and used a burr to convert the acromion to a Type 1 flat acromion without issue.  Biceps tenodesis: We marked the tendon and then performed a tenotomy and debridement of the stump in the articular space. We then identified the biceps tendon in its groove suprapec with the arthroscope in the lateral portal taking care to move from lateral to medial to avoid injury to the subscapularis. At that point we unroofed the tendon itself and mobilized it. An accessory anterior portal was made in line with the tendon and we grasped it from the anterior superior portal and worked from the accessory anterior portal. Two Fibertak 1.110mm knotless anchors were placed in the groove and the tendon was secured in a luggage loop style fashion with a pass of the limb of suture through the tendon using a scorpion device to avoid pull-through.  Repair was completed with good tension on the tendon.  Residual stump of the tendon was removed after being resected with a RF ablator.  Arthroscopic Rotator Cuff Repair: Tuberosity was prepared with a burr to a bleeding bed.  There  was significant retraction and we performed releases with a traction stitch of the anterior cuff, we then were able to mobilize the tissue.  Following completion of the above we placed 3 - 4.7 Swivelock anchor loaded with a tape at inserted at the medial articular margin and an scorpion suture passing device, shuttled  sutures medially in a horizontal mattress suture configuration.  We then tied using arthroscopic knot tying techniques  each suture to its partner reducing the tendon at the prepared insertion site.  The fiber tape was not tied. With a medial row suture limbs then incorporated, 3 anteriorly and 3 posteriorly, into each of two 4.75 PEEK SwiveLock anchors, each placed 8 to 10 mm below the tip of the tuberosity and spanning anterior-posterior width of the tear with care to avoid over tensioning.  We brought the traction stitches into the anterior anchor.  We also noted a small dogear and placed a 1.3mm fibertak anteriorly and brought that down to bone.  The incisions were closed with absorbable monocryl and steri strips.  A sterile dressing was placed along with a sling. The patient was awoken from general anesthesia and taken to the PACU in stable condition without complication.   Nicholas Bari, PA-C, present and scrubbed throughout the case, critical for completion in a timely fashion, and for retraction, instrumentation, closure.

## 2023-09-22 NOTE — Progress Notes (Signed)
Assisted Dr. Brock with right, interscalene , ultrasound guided block. Side rails up, monitors on throughout procedure. See vital signs in flow sheet. Tolerated Procedure well. 

## 2023-09-22 NOTE — Interval H&P Note (Signed)
 All questions answered, patient wants to proceed with procedure. ? ?

## 2023-09-22 NOTE — Transfer of Care (Signed)
 Immediate Anesthesia Transfer of Care Note  Patient: Steven Torres  Procedure(s) Performed: ARTHROSCOPY, SHOULDER, WITH ROTATOR CUFF REPAIR (Right) DECOMPRESSION, SUBACROMIAL SPACE (Right) TENODESIS, BICEPS (Right) ARTHROSCOPY, SHOULDER WITH DEBRIDEMENT (Right)  Patient Location: PACU  Anesthesia Type:GA combined with regional for post-op pain  Level of Consciousness: drowsy  Airway & Oxygen Therapy: Patient Spontanous Breathing and Patient connected to face mask oxygen  Post-op Assessment: Report given to RN and Post -op Vital signs reviewed and stable  Post vital signs: Reviewed and stable  Last Vitals:  Vitals Value Taken Time  BP 137/67 (87)   Temp 36.2 C 09/22/23 0935  Pulse 74 09/22/23 0935  Resp 11 09/22/23 0935  SpO2 99 % 09/22/23 0935    Last Pain:  Vitals:   09/22/23 0622  TempSrc: Temporal  PainSc: 0-No pain         Complications: No notable events documented.

## 2023-09-22 NOTE — Anesthesia Procedure Notes (Signed)
 Procedure Name: Intubation Date/Time: 09/22/2023 8:03 AM  Performed by: Noralyn Beams, CRNAPre-anesthesia Checklist: Patient identified, Emergency Drugs available, Suction available and Patient being monitored Patient Re-evaluated:Patient Re-evaluated prior to induction Oxygen Delivery Method: Circle system utilized Preoxygenation: Pre-oxygenation with 100% oxygen Induction Type: IV induction Ventilation: Mask ventilation without difficulty and Oral airway inserted - appropriate to patient size Laryngoscope Size: Mac and 4 Grade View: Grade I Tube type: Oral Tube size: 7.5 mm Number of attempts: 1 Airway Equipment and Method: Stylet, Oral airway and Bite block Placement Confirmation: ETT inserted through vocal cords under direct vision, positive ETCO2 and breath sounds checked- equal and bilateral Secured at: 23 cm Tube secured with: Tape Dental Injury: Teeth and Oropharynx as per pre-operative assessment

## 2023-09-22 NOTE — Anesthesia Postprocedure Evaluation (Signed)
 Anesthesia Post Note  Patient: MARISOL GLAZER  Procedure(s) Performed: ARTHROSCOPY, SHOULDER, WITH ROTATOR CUFF REPAIR (Right) DECOMPRESSION, SUBACROMIAL SPACE (Right) TENODESIS, BICEPS (Right) ARTHROSCOPY, SHOULDER WITH DEBRIDEMENT (Right)     Patient location during evaluation: PACU Anesthesia Type: General Level of consciousness: awake and alert Pain management: pain level controlled Vital Signs Assessment: post-procedure vital signs reviewed and stable Respiratory status: spontaneous breathing, nonlabored ventilation and respiratory function stable Cardiovascular status: stable and blood pressure returned to baseline Anesthetic complications: no  No notable events documented.  Last Vitals:  Vitals:   09/22/23 1000 09/22/23 1018  BP: (!) 146/78 130/76  Pulse: 75 78  Resp: 14 20  Temp:  (!) 36.2 C  SpO2: 96% 97%    Last Pain:  Vitals:   09/22/23 1018  TempSrc: Temporal  PainSc: 0-No pain                 Juventino Oppenheim

## 2023-09-23 ENCOUNTER — Encounter (HOSPITAL_BASED_OUTPATIENT_CLINIC_OR_DEPARTMENT_OTHER): Payer: Self-pay | Admitting: Orthopaedic Surgery

## 2023-09-27 DIAGNOSIS — S46011D Strain of muscle(s) and tendon(s) of the rotator cuff of right shoulder, subsequent encounter: Secondary | ICD-10-CM | POA: Diagnosis not present

## 2023-09-30 ENCOUNTER — Other Ambulatory Visit (HOSPITAL_BASED_OUTPATIENT_CLINIC_OR_DEPARTMENT_OTHER): Payer: Self-pay

## 2023-09-30 DIAGNOSIS — S46011D Strain of muscle(s) and tendon(s) of the rotator cuff of right shoulder, subsequent encounter: Secondary | ICD-10-CM | POA: Diagnosis not present

## 2023-09-30 MED ORDER — METHOCARBAMOL 500 MG PO TABS
500.0000 mg | ORAL_TABLET | Freq: Three times a day (TID) | ORAL | 0 refills | Status: AC | PRN
  Filled 2023-09-30: qty 20, 7d supply, fill #0

## 2023-10-04 DIAGNOSIS — S46011D Strain of muscle(s) and tendon(s) of the rotator cuff of right shoulder, subsequent encounter: Secondary | ICD-10-CM | POA: Diagnosis not present

## 2023-10-07 DIAGNOSIS — S46011D Strain of muscle(s) and tendon(s) of the rotator cuff of right shoulder, subsequent encounter: Secondary | ICD-10-CM | POA: Diagnosis not present

## 2023-10-10 DIAGNOSIS — S46011D Strain of muscle(s) and tendon(s) of the rotator cuff of right shoulder, subsequent encounter: Secondary | ICD-10-CM | POA: Diagnosis not present

## 2023-10-12 DIAGNOSIS — S46011D Strain of muscle(s) and tendon(s) of the rotator cuff of right shoulder, subsequent encounter: Secondary | ICD-10-CM | POA: Diagnosis not present

## 2023-10-18 DIAGNOSIS — S46011D Strain of muscle(s) and tendon(s) of the rotator cuff of right shoulder, subsequent encounter: Secondary | ICD-10-CM | POA: Diagnosis not present

## 2023-10-25 DIAGNOSIS — S46011D Strain of muscle(s) and tendon(s) of the rotator cuff of right shoulder, subsequent encounter: Secondary | ICD-10-CM | POA: Diagnosis not present

## 2023-11-02 DIAGNOSIS — S46011D Strain of muscle(s) and tendon(s) of the rotator cuff of right shoulder, subsequent encounter: Secondary | ICD-10-CM | POA: Diagnosis not present

## 2023-11-08 DIAGNOSIS — S46011D Strain of muscle(s) and tendon(s) of the rotator cuff of right shoulder, subsequent encounter: Secondary | ICD-10-CM | POA: Diagnosis not present

## 2023-11-14 DIAGNOSIS — M6281 Muscle weakness (generalized): Secondary | ICD-10-CM | POA: Diagnosis not present

## 2023-11-14 DIAGNOSIS — S46011D Strain of muscle(s) and tendon(s) of the rotator cuff of right shoulder, subsequent encounter: Secondary | ICD-10-CM | POA: Diagnosis not present

## 2023-11-14 DIAGNOSIS — M25611 Stiffness of right shoulder, not elsewhere classified: Secondary | ICD-10-CM | POA: Diagnosis not present

## 2023-11-17 DIAGNOSIS — M25611 Stiffness of right shoulder, not elsewhere classified: Secondary | ICD-10-CM | POA: Diagnosis not present

## 2023-11-17 DIAGNOSIS — M6281 Muscle weakness (generalized): Secondary | ICD-10-CM | POA: Diagnosis not present

## 2023-11-17 DIAGNOSIS — S46011D Strain of muscle(s) and tendon(s) of the rotator cuff of right shoulder, subsequent encounter: Secondary | ICD-10-CM | POA: Diagnosis not present

## 2023-11-18 ENCOUNTER — Other Ambulatory Visit: Payer: Self-pay | Admitting: Gastroenterology

## 2023-11-18 ENCOUNTER — Other Ambulatory Visit (HOSPITAL_BASED_OUTPATIENT_CLINIC_OR_DEPARTMENT_OTHER): Payer: Self-pay

## 2023-11-18 MED ORDER — MESALAMINE 1.2 G PO TBEC
2.4000 g | DELAYED_RELEASE_TABLET | Freq: Every day | ORAL | 1 refills | Status: AC
Start: 2023-11-18 — End: ?
  Filled 2023-11-18: qty 180, 90d supply, fill #0
  Filled 2024-02-13: qty 180, 90d supply, fill #1

## 2023-11-21 DIAGNOSIS — M25611 Stiffness of right shoulder, not elsewhere classified: Secondary | ICD-10-CM | POA: Diagnosis not present

## 2023-11-21 DIAGNOSIS — M6281 Muscle weakness (generalized): Secondary | ICD-10-CM | POA: Diagnosis not present

## 2023-11-21 DIAGNOSIS — S46011D Strain of muscle(s) and tendon(s) of the rotator cuff of right shoulder, subsequent encounter: Secondary | ICD-10-CM | POA: Diagnosis not present

## 2023-11-24 ENCOUNTER — Ambulatory Visit: Payer: Commercial Managed Care - PPO | Admitting: Family Medicine

## 2023-11-24 DIAGNOSIS — M6281 Muscle weakness (generalized): Secondary | ICD-10-CM | POA: Diagnosis not present

## 2023-11-24 DIAGNOSIS — M25611 Stiffness of right shoulder, not elsewhere classified: Secondary | ICD-10-CM | POA: Diagnosis not present

## 2023-11-24 DIAGNOSIS — S46011D Strain of muscle(s) and tendon(s) of the rotator cuff of right shoulder, subsequent encounter: Secondary | ICD-10-CM | POA: Diagnosis not present

## 2023-11-25 ENCOUNTER — Encounter: Payer: 59 | Admitting: Family Medicine

## 2023-11-28 DIAGNOSIS — M6281 Muscle weakness (generalized): Secondary | ICD-10-CM | POA: Diagnosis not present

## 2023-11-28 DIAGNOSIS — S46011D Strain of muscle(s) and tendon(s) of the rotator cuff of right shoulder, subsequent encounter: Secondary | ICD-10-CM | POA: Diagnosis not present

## 2023-11-28 DIAGNOSIS — M25611 Stiffness of right shoulder, not elsewhere classified: Secondary | ICD-10-CM | POA: Diagnosis not present

## 2023-12-01 DIAGNOSIS — M25611 Stiffness of right shoulder, not elsewhere classified: Secondary | ICD-10-CM | POA: Diagnosis not present

## 2023-12-01 DIAGNOSIS — M6281 Muscle weakness (generalized): Secondary | ICD-10-CM | POA: Diagnosis not present

## 2023-12-01 DIAGNOSIS — S46011D Strain of muscle(s) and tendon(s) of the rotator cuff of right shoulder, subsequent encounter: Secondary | ICD-10-CM | POA: Diagnosis not present

## 2023-12-06 DIAGNOSIS — S46011D Strain of muscle(s) and tendon(s) of the rotator cuff of right shoulder, subsequent encounter: Secondary | ICD-10-CM | POA: Diagnosis not present

## 2023-12-06 DIAGNOSIS — M6281 Muscle weakness (generalized): Secondary | ICD-10-CM | POA: Diagnosis not present

## 2023-12-06 DIAGNOSIS — M25611 Stiffness of right shoulder, not elsewhere classified: Secondary | ICD-10-CM | POA: Diagnosis not present

## 2023-12-08 DIAGNOSIS — M6281 Muscle weakness (generalized): Secondary | ICD-10-CM | POA: Diagnosis not present

## 2023-12-08 DIAGNOSIS — S46011D Strain of muscle(s) and tendon(s) of the rotator cuff of right shoulder, subsequent encounter: Secondary | ICD-10-CM | POA: Diagnosis not present

## 2023-12-08 DIAGNOSIS — M25611 Stiffness of right shoulder, not elsewhere classified: Secondary | ICD-10-CM | POA: Diagnosis not present

## 2023-12-12 ENCOUNTER — Other Ambulatory Visit: Payer: Self-pay | Admitting: Family Medicine

## 2023-12-12 DIAGNOSIS — M6281 Muscle weakness (generalized): Secondary | ICD-10-CM | POA: Diagnosis not present

## 2023-12-12 DIAGNOSIS — M25611 Stiffness of right shoulder, not elsewhere classified: Secondary | ICD-10-CM | POA: Diagnosis not present

## 2023-12-12 DIAGNOSIS — S46011D Strain of muscle(s) and tendon(s) of the rotator cuff of right shoulder, subsequent encounter: Secondary | ICD-10-CM | POA: Diagnosis not present

## 2023-12-12 NOTE — Telephone Encounter (Signed)
 Will call patient to schedule an appointment tomorrow

## 2023-12-13 ENCOUNTER — Other Ambulatory Visit (HOSPITAL_BASED_OUTPATIENT_CLINIC_OR_DEPARTMENT_OTHER): Payer: Self-pay

## 2023-12-13 MED ORDER — ATORVASTATIN CALCIUM 10 MG PO TABS
10.0000 mg | ORAL_TABLET | Freq: Every day | ORAL | 0 refills | Status: DC
  Filled 2023-12-13: qty 30, 30d supply, fill #0

## 2023-12-15 DIAGNOSIS — M6281 Muscle weakness (generalized): Secondary | ICD-10-CM | POA: Diagnosis not present

## 2023-12-15 DIAGNOSIS — M25611 Stiffness of right shoulder, not elsewhere classified: Secondary | ICD-10-CM | POA: Diagnosis not present

## 2023-12-15 DIAGNOSIS — S46011D Strain of muscle(s) and tendon(s) of the rotator cuff of right shoulder, subsequent encounter: Secondary | ICD-10-CM | POA: Diagnosis not present

## 2023-12-19 DIAGNOSIS — S46011D Strain of muscle(s) and tendon(s) of the rotator cuff of right shoulder, subsequent encounter: Secondary | ICD-10-CM | POA: Diagnosis not present

## 2023-12-19 DIAGNOSIS — M25611 Stiffness of right shoulder, not elsewhere classified: Secondary | ICD-10-CM | POA: Diagnosis not present

## 2023-12-19 DIAGNOSIS — M6281 Muscle weakness (generalized): Secondary | ICD-10-CM | POA: Diagnosis not present

## 2023-12-22 DIAGNOSIS — M6281 Muscle weakness (generalized): Secondary | ICD-10-CM | POA: Diagnosis not present

## 2023-12-22 DIAGNOSIS — M25611 Stiffness of right shoulder, not elsewhere classified: Secondary | ICD-10-CM | POA: Diagnosis not present

## 2023-12-22 DIAGNOSIS — S46011D Strain of muscle(s) and tendon(s) of the rotator cuff of right shoulder, subsequent encounter: Secondary | ICD-10-CM | POA: Diagnosis not present

## 2023-12-23 DIAGNOSIS — S46011D Strain of muscle(s) and tendon(s) of the rotator cuff of right shoulder, subsequent encounter: Secondary | ICD-10-CM | POA: Diagnosis not present

## 2023-12-27 DIAGNOSIS — M6281 Muscle weakness (generalized): Secondary | ICD-10-CM | POA: Diagnosis not present

## 2023-12-27 DIAGNOSIS — M25611 Stiffness of right shoulder, not elsewhere classified: Secondary | ICD-10-CM | POA: Diagnosis not present

## 2023-12-27 DIAGNOSIS — S46011D Strain of muscle(s) and tendon(s) of the rotator cuff of right shoulder, subsequent encounter: Secondary | ICD-10-CM | POA: Diagnosis not present

## 2023-12-29 DIAGNOSIS — M25611 Stiffness of right shoulder, not elsewhere classified: Secondary | ICD-10-CM | POA: Diagnosis not present

## 2023-12-29 DIAGNOSIS — M6281 Muscle weakness (generalized): Secondary | ICD-10-CM | POA: Diagnosis not present

## 2023-12-29 DIAGNOSIS — S46011D Strain of muscle(s) and tendon(s) of the rotator cuff of right shoulder, subsequent encounter: Secondary | ICD-10-CM | POA: Diagnosis not present

## 2024-01-03 ENCOUNTER — Encounter: Payer: Self-pay | Admitting: Sports Medicine

## 2024-01-09 ENCOUNTER — Other Ambulatory Visit: Payer: Self-pay | Admitting: Family Medicine

## 2024-01-09 ENCOUNTER — Other Ambulatory Visit (HOSPITAL_BASED_OUTPATIENT_CLINIC_OR_DEPARTMENT_OTHER): Payer: Self-pay

## 2024-01-09 MED ORDER — ATORVASTATIN CALCIUM 10 MG PO TABS
10.0000 mg | ORAL_TABLET | Freq: Every day | ORAL | 0 refills | Status: DC
  Filled 2024-01-09: qty 30, 30d supply, fill #0

## 2024-01-27 ENCOUNTER — Ambulatory Visit (INDEPENDENT_AMBULATORY_CARE_PROVIDER_SITE_OTHER): Admitting: Family Medicine

## 2024-01-27 ENCOUNTER — Encounter: Payer: Self-pay | Admitting: Family Medicine

## 2024-01-27 VITALS — BP 132/70 | HR 80 | Temp 97.8°F | Ht 73.0 in | Wt 202.0 lb

## 2024-01-27 DIAGNOSIS — Z0000 Encounter for general adult medical examination without abnormal findings: Secondary | ICD-10-CM

## 2024-01-27 DIAGNOSIS — K4090 Unilateral inguinal hernia, without obstruction or gangrene, not specified as recurrent: Secondary | ICD-10-CM

## 2024-01-27 DIAGNOSIS — E785 Hyperlipidemia, unspecified: Secondary | ICD-10-CM | POA: Diagnosis not present

## 2024-01-27 DIAGNOSIS — Z125 Encounter for screening for malignant neoplasm of prostate: Secondary | ICD-10-CM

## 2024-01-27 LAB — CBC WITH DIFFERENTIAL/PLATELET
Basophils Absolute: 0 K/uL (ref 0.0–0.1)
Basophils Relative: 0.9 % (ref 0.0–3.0)
Eosinophils Absolute: 0.1 K/uL (ref 0.0–0.7)
Eosinophils Relative: 1.7 % (ref 0.0–5.0)
HCT: 44.9 % (ref 39.0–52.0)
Hemoglobin: 15.2 g/dL (ref 13.0–17.0)
Lymphocytes Relative: 29.4 % (ref 12.0–46.0)
Lymphs Abs: 1.5 K/uL (ref 0.7–4.0)
MCHC: 33.8 g/dL (ref 30.0–36.0)
MCV: 94.3 fl (ref 78.0–100.0)
Monocytes Absolute: 0.4 K/uL (ref 0.1–1.0)
Monocytes Relative: 9 % (ref 3.0–12.0)
Neutro Abs: 2.9 K/uL (ref 1.4–7.7)
Neutrophils Relative %: 59 % (ref 43.0–77.0)
Platelets: 350 K/uL (ref 150.0–400.0)
RBC: 4.75 Mil/uL (ref 4.22–5.81)
RDW: 12.9 % (ref 11.5–15.5)
WBC: 5 K/uL (ref 4.0–10.5)

## 2024-01-27 LAB — LIPID PANEL
Cholesterol: 150 mg/dL (ref 0–200)
HDL: 54.2 mg/dL (ref >39.00)
LDL Cholesterol: 79 mg/dL (ref 0–99)
NonHDL: 95.65
Total CHOL/HDL Ratio: 3
Triglycerides: 84 mg/dL (ref 0.0–149.0)
VLDL: 16.8 mg/dL (ref 0.0–40.0)

## 2024-01-27 LAB — BASIC METABOLIC PANEL WITH GFR
BUN: 15 mg/dL (ref 6–23)
CO2: 28 meq/L (ref 19–32)
Calcium: 9.9 mg/dL (ref 8.4–10.5)
Chloride: 104 meq/L (ref 96–112)
Creatinine, Ser: 1.02 mg/dL (ref 0.40–1.50)
GFR: 78.4 mL/min (ref >60.00)
Glucose, Bld: 98 mg/dL (ref 70–99)
Potassium: 4.1 meq/L (ref 3.5–5.1)
Sodium: 141 meq/L (ref 135–145)

## 2024-01-27 LAB — HEPATIC FUNCTION PANEL
ALT: 19 U/L (ref 0–53)
AST: 17 U/L (ref 0–37)
Albumin: 4.9 g/dL (ref 3.5–5.2)
Alkaline Phosphatase: 73 U/L (ref 39–117)
Bilirubin, Direct: 0.2 mg/dL (ref 0.0–0.3)
Total Bilirubin: 1.1 mg/dL (ref 0.2–1.2)
Total Protein: 7.2 g/dL (ref 6.0–8.3)

## 2024-01-27 LAB — TSH: TSH: 1.56 u[IU]/mL (ref 0.35–5.50)

## 2024-01-27 LAB — PSA: PSA: 1.92 ng/mL (ref 0.10–4.00)

## 2024-01-27 NOTE — Patient Instructions (Signed)
 Follow up in 1 year or as needed We'll notify you of your lab results and make any changes if needed Keep up the good work on healthy diet and regular exercise- you look great! We'll call you to schedule your surgery appt regarding the hernia Call with any questions or concerns Stay Safe!  Stay Healthy! Happy Fall!!

## 2024-01-27 NOTE — Progress Notes (Signed)
   Subjective:    Patient ID: Steven Torres, male    DOB: Mar 19, 1961, 63 y.o.   MRN: [id number]  HPI CPE- UTD on colonoscopy, Tdap.  Declines PNA and flu.  Patient Care Team    Relationship Specialty Notifications Start End  Mahlon Comer BRAVO, MD PCP - General Family Medicine  09/15/15   Armbruster, Elspeth SQUIBB, MD Consulting Physician Gastroenterology  10/24/17   Cam Morene ORN, MD Attending Physician Urology  11/09/18     Health Maintenance  Topic Date Due   Pneumococcal Vaccine: 50+ Years (1 of 1 - PCV) Never done   Zoster Vaccines- Shingrix (1 of 2) Never done   Influenza Vaccine  Never done   Colonoscopy  10/29/2026   DTaP/Tdap/Td (2 - Td or Tdap) 10/25/2027   Hepatitis C Screening  Completed   HIV Screening  Completed   Hepatitis B Vaccines 19-59 Average Risk  Aged Out   HPV VACCINES  Aged Out   Meningococcal B Vaccine  Aged Out   COVID-19 Vaccine  Discontinued      Review of Systems Patient reports no vision/hearing changes, anorexia, fever ,adenopathy, persistant/recurrent hoarseness, swallowing issues, chest pain, palpitations, edema, persistant/recurrent cough, hemoptysis, dyspnea (rest,exertional, paroxysmal nocturnal), gastrointestinal  bleeding (melena, rectal bleeding), abdominal pain, excessive heart burn, GU symptoms (dysuria, hematuria, voiding/incontinence issues) syncope, focal weakness, memory loss, numbness & tingling, skin/hair/nail changes, depression, anxiety, abnormal bruising/bleeding, musculoskeletal symptoms/signs.   L groin pain- pt feels he may have a hernia.  Hurts off and on.  Has small bulge present.  Has hx of R inguinal hernia.      Objective:   Physical Exam General Appearance:    Alert, cooperative, no distress, appears stated age  Head:    Normocephalic, without obvious abnormality, atraumatic  Eyes:    PERRL, conjunctiva/corneas clear, EOM's intact both eyes       Ears:    Normal TM's and external ear canals, both ears  Nose:   Nares  normal, septum midline, mucosa normal, no drainage   or sinus tenderness  Throat:   Lips, mucosa, and tongue normal; teeth and gums normal  Neck:   Supple, symmetrical, trachea midline, no adenopathy;       thyroid :  No enlargement/tenderness/nodules  Back:     Symmetric, no curvature, ROM normal, no CVA tenderness  Lungs:     Clear to auscultation bilaterally, respirations unlabored  Chest wall:    No tenderness or deformity  Heart:    Regular rate and rhythm, S1 and S2 normal, no murmur, rub   or gallop  Abdomen:     Soft, non-tender, bowel sounds active all four quadrants,    no masses, no organomegaly.  Small muscle defect L groin, mildly TTP  Genitalia:    deferred  Rectal:    Extremities:   Extremities normal, atraumatic, no cyanosis or edema  Pulses:   2+ and symmetric all extremities  Skin:   Skin color, texture, turgor normal, no rashes or lesions  Lymph nodes:   Cervical, supraclavicular, and axillary nodes normal  Neurologic:   CNII-XII intact. Normal strength, sensation and reflexes      throughout          Assessment & Plan:

## 2024-01-27 NOTE — Assessment & Plan Note (Signed)
 Pt's PE WNL w/ exception of small muscle defect in L groin.  UTD on colonoscopy, Tdap.  Declines flu and PNA today.  Check labs.  Anticipatory guidance provided.

## 2024-01-31 ENCOUNTER — Ambulatory Visit: Payer: Self-pay | Admitting: Family Medicine

## 2024-01-31 NOTE — Progress Notes (Signed)
 LVM to call office or via MyChart lab results

## 2024-02-02 NOTE — Progress Notes (Signed)
 Unable to leave vm due to mail box not being set up

## 2024-02-03 NOTE — Telephone Encounter (Signed)
-----   Message from Comer Greet sent at 01/31/2024  7:46 AM EDT ----- Labs look great!  No changes at this time ----- Message ----- From: Interface, Lab In Three Zero One Sent: 01/27/2024   2:02 PM EDT To: Comer FORBES Greet, MD

## 2024-02-03 NOTE — Telephone Encounter (Signed)
 Third call attempt, unable to leave vm due to it not being set up. Will send UTC letter

## 2024-02-07 DIAGNOSIS — Z9889 Other specified postprocedural states: Secondary | ICD-10-CM | POA: Diagnosis not present

## 2024-02-07 DIAGNOSIS — Z4789 Encounter for other orthopedic aftercare: Secondary | ICD-10-CM | POA: Diagnosis not present

## 2024-02-13 ENCOUNTER — Other Ambulatory Visit (HOSPITAL_BASED_OUTPATIENT_CLINIC_OR_DEPARTMENT_OTHER): Payer: Self-pay

## 2024-02-13 ENCOUNTER — Other Ambulatory Visit: Payer: Self-pay | Admitting: Family Medicine

## 2024-02-13 ENCOUNTER — Other Ambulatory Visit: Payer: Self-pay

## 2024-02-13 MED ORDER — ATORVASTATIN CALCIUM 10 MG PO TABS
10.0000 mg | ORAL_TABLET | Freq: Every day | ORAL | 0 refills | Status: DC
  Filled 2024-02-13: qty 30, 30d supply, fill #0

## 2024-03-05 ENCOUNTER — Encounter: Payer: Self-pay | Admitting: Radiology

## 2024-03-09 ENCOUNTER — Other Ambulatory Visit: Payer: Self-pay | Admitting: Family Medicine

## 2024-03-12 ENCOUNTER — Other Ambulatory Visit (HOSPITAL_BASED_OUTPATIENT_CLINIC_OR_DEPARTMENT_OTHER): Payer: Self-pay

## 2024-03-12 MED ORDER — ATORVASTATIN CALCIUM 10 MG PO TABS
10.0000 mg | ORAL_TABLET | Freq: Every day | ORAL | 0 refills | Status: AC
  Filled 2024-03-12: qty 90, 90d supply, fill #0

## 2024-03-13 ENCOUNTER — Other Ambulatory Visit (HOSPITAL_BASED_OUTPATIENT_CLINIC_OR_DEPARTMENT_OTHER): Payer: Self-pay

## 2024-04-19 DIAGNOSIS — H5213 Myopia, bilateral: Secondary | ICD-10-CM | POA: Diagnosis not present

## 2024-05-08 ENCOUNTER — Encounter (HOSPITAL_BASED_OUTPATIENT_CLINIC_OR_DEPARTMENT_OTHER): Payer: Self-pay | Admitting: Orthopaedic Surgery
